# Patient Record
Sex: Male | Born: 1945 | Race: White | Hispanic: No | Marital: Married | Smoking: Never smoker
Health system: Southern US, Community
[De-identification: ages and names within clinical notes are randomized; demographics above are authoritative.]

## PROBLEM LIST (undated history)

## (undated) DIAGNOSIS — F1020 Alcohol dependence, uncomplicated: Secondary | ICD-10-CM

## (undated) DIAGNOSIS — N400 Enlarged prostate without lower urinary tract symptoms: Secondary | ICD-10-CM

## (undated) DIAGNOSIS — K649 Unspecified hemorrhoids: Secondary | ICD-10-CM

## (undated) DIAGNOSIS — E785 Hyperlipidemia, unspecified: Secondary | ICD-10-CM

## (undated) DIAGNOSIS — R933 Abnormal findings on diagnostic imaging of other parts of digestive tract: Secondary | ICD-10-CM

## (undated) DIAGNOSIS — K8020 Calculus of gallbladder without cholecystitis without obstruction: Secondary | ICD-10-CM

## (undated) DIAGNOSIS — F32A Depression, unspecified: Secondary | ICD-10-CM

## (undated) DIAGNOSIS — K219 Gastro-esophageal reflux disease without esophagitis: Secondary | ICD-10-CM

## (undated) DIAGNOSIS — I10 Essential (primary) hypertension: Secondary | ICD-10-CM

## (undated) DIAGNOSIS — Z87448 Personal history of other diseases of urinary system: Secondary | ICD-10-CM

## (undated) DIAGNOSIS — K5790 Diverticulosis of intestine, part unspecified, without perforation or abscess without bleeding: Secondary | ICD-10-CM

## (undated) DIAGNOSIS — G4730 Sleep apnea, unspecified: Secondary | ICD-10-CM

## (undated) DIAGNOSIS — E611 Iron deficiency: Secondary | ICD-10-CM

## (undated) HISTORY — DX: Abnormal findings on diagnostic imaging of other parts of digestive tract: R93.3

## (undated) HISTORY — DX: Personal history of other diseases of urinary system: Z87.448

## (undated) HISTORY — DX: Gastro-esophageal reflux disease without esophagitis: K21.9

## (undated) HISTORY — DX: Calculus of gallbladder without cholecystitis without obstruction: K80.20

## (undated) HISTORY — DX: Sleep apnea, unspecified: G47.30

## (undated) HISTORY — DX: Alcohol dependence, uncomplicated: F10.20

## (undated) HISTORY — PX: RHINOPLASTY: SUR1284

## (undated) HISTORY — DX: Diverticulosis of intestine, part unspecified, without perforation or abscess without bleeding: K57.90

## (undated) HISTORY — DX: Iron deficiency: E61.1

## (undated) HISTORY — DX: Unspecified hemorrhoids: K64.9

## (undated) HISTORY — DX: Benign prostatic hyperplasia without lower urinary tract symptoms: N40.0

## (undated) HISTORY — PX: OTHER SURGICAL HISTORY: SHX169

## (undated) HISTORY — PX: COLONOSCOPY: SHX174

## (undated) HISTORY — PX: TONSILLECTOMY: SUR1361

## (undated) HISTORY — DX: Essential (primary) hypertension: I10

## (undated) HISTORY — DX: Hyperlipidemia, unspecified: E78.5

## (undated) HISTORY — PX: UPPER GASTROINTESTINAL ENDOSCOPY: SHX188

## (undated) HISTORY — PX: VASECTOMY: SHX75

---

## 2008-04-03 LAB — HM COLONOSCOPY: HM Colonoscopy: NORMAL

## 2009-02-12 HISTORY — PX: CARDIOVASCULAR STRESS TEST: SHX262

## 2012-02-08 ENCOUNTER — Encounter: Payer: Self-pay | Admitting: Internal Medicine

## 2012-02-08 ENCOUNTER — Ambulatory Visit (INDEPENDENT_AMBULATORY_CARE_PROVIDER_SITE_OTHER): Payer: PRIVATE HEALTH INSURANCE | Admitting: Internal Medicine

## 2012-02-08 DIAGNOSIS — I10 Essential (primary) hypertension: Secondary | ICD-10-CM

## 2012-02-08 DIAGNOSIS — E785 Hyperlipidemia, unspecified: Secondary | ICD-10-CM | POA: Insufficient documentation

## 2012-02-08 DIAGNOSIS — E119 Type 2 diabetes mellitus without complications: Secondary | ICD-10-CM | POA: Insufficient documentation

## 2012-02-08 DIAGNOSIS — E669 Obesity, unspecified: Secondary | ICD-10-CM | POA: Insufficient documentation

## 2012-02-08 NOTE — Assessment & Plan Note (Signed)
Controlled on Micardis. Patient would like to change to generic alternative. He will e-mail as to the dose of his Micardis. We'll then plan to change to losartan. We'll check renal function with labs at his work today. Followup in 6 months.

## 2012-02-08 NOTE — Progress Notes (Signed)
Subjective:    Patient ID: Bruce Wyatt, male    DOB: [birth year], 66 y.o.   MRN: [id number]  HPI 66 year old male with a history of diabetes, hypertension, hyperlipidemia presents to establish care. He reports that he is generally doing well. He denies any complaints today. In regards to his diabetes, he notes that it has been described as borderline diabetes. His last hemoglobin A1c was 5.3%. He notes that in the past it has been as high as 7%. He currently takes metformin twice daily. He denies any side effects from this medication. He does not regularly check his blood sugar.  In regards to his hypertension, he notes good control with use of Micardis. He would like to consider changing to a less expensive medication. He is not able to tolerate ACE inhibitors because of cough. He denies any chest pain, palpitations, or shortness of breath.  In regards to hyperlipidemia, he notes that he has been taking Crestor. He denies any myalgia with this medication. He has not had his cholesterol checked within the last 6 months.  Outpatient Encounter Prescriptions as of 02/08/2012  Medication Sig Dispense Refill  . Multiple Vitamins-Minerals (MULTIVITAMIN WITH MINERALS) tablet Take 1 tablet by mouth daily.      . NON FORMULARY Micardis - unknown dose Crestor - unknown dose      . esomeprazole (NEXIUM) 40 MG capsule Take 40 mg by mouth daily before breakfast.      . metFORMIN (GLUCOPHAGE) 500 MG tablet Take 500 mg by mouth 2 (two) times daily with a meal.      . venlafaxine (EFFEXOR-XR) 75 MG 24 hr capsule Take 75 mg by mouth daily.      Marland Kitchen zolpidem (AMBIEN) 10 MG tablet Take 10 mg by mouth at bedtime as needed.        Review of Systems  Constitutional: Negative for fever, chills, activity change, appetite change, fatigue and unexpected weight change.  Eyes: Negative for visual disturbance.  Respiratory: Negative for cough and shortness of breath.   Cardiovascular: Negative for chest pain,  palpitations and leg swelling.  Gastrointestinal: Negative for abdominal pain and abdominal distention.  Genitourinary: Negative for dysuria, urgency and difficulty urinating.  Musculoskeletal: Negative for arthralgias and gait problem.  Skin: Negative for color change and rash.  Hematological: Negative for adenopathy.  Psychiatric/Behavioral: Negative for sleep disturbance and dysphoric mood. The patient is not nervous/anxious.    BP 120/78  Pulse 97  Temp(Src) 98 F (36.7 C) (Oral)  Ht 6\' 1"  (1.854 m)  Wt 246 lb (111.585 kg)  BMI 32.46 kg/m2  SpO2 96%     Objective:   Physical Exam  Constitutional: He is oriented to person, place, and time. He appears well-developed and well-nourished. No distress.  HENT:  Head: Normocephalic and atraumatic.  Right Ear: External ear normal.  Left Ear: External ear normal.  Nose: Nose normal.  Mouth/Throat: Oropharynx is clear and moist. No oropharyngeal exudate.  Eyes: Conjunctivae and EOM are normal. Pupils are equal, round, and reactive to light. Right eye exhibits no discharge. Left eye exhibits no discharge. No scleral icterus.  Neck: Normal range of motion. Neck supple. No tracheal deviation present. No thyromegaly present.  Cardiovascular: Normal rate, regular rhythm and normal heart sounds.  Exam reveals no gallop and no friction rub.   No murmur heard. Pulmonary/Chest: Effort normal and breath sounds normal. No respiratory distress. He has no wheezes. He has no rales. He exhibits no tenderness.  Musculoskeletal: Normal range of motion. He exhibits  no edema.  Lymphadenopathy:    He has no cervical adenopathy.  Neurological: He is alert and oriented to person, place, and time. No cranial nerve deficit. Coordination normal.  Skin: Skin is warm and dry. No rash noted. He is not diaphoretic. No erythema. No pallor.  Psychiatric: He has a normal mood and affect. His behavior is normal. Judgment and thought content normal.            Assessment & Plan:

## 2012-02-08 NOTE — Assessment & Plan Note (Signed)
BMI 32. Discussed mechanisms for weight loss including keeping a food diary and regular physical activity. Gave information on websites to help with calorie counting. Initial goal weight loss 10-20 pounds for overall improved health. Followup 6 months.

## 2012-02-08 NOTE — Assessment & Plan Note (Signed)
Will check lipids with labs at his work. He will call or e-mail with his dose of Crestor. Followup in 6 months.

## 2012-02-08 NOTE — Patient Instructions (Signed)
[email or web address]  [email or web address]  [email or web address]

## 2012-02-08 NOTE — Assessment & Plan Note (Signed)
Patient reports good control with last hemoglobin A1c of 5.3%. We'll continue metformin. Will recheck A1c with labs at his work. Followup 6 months.

## 2012-02-08 NOTE — Progress Notes (Signed)
Addended by: Vernie Murders on: 02/08/2012 06:30 PM   Modules accepted: Orders

## 2012-02-23 ENCOUNTER — Telehealth: Payer: Self-pay | Admitting: Internal Medicine

## 2012-02-23 NOTE — Telephone Encounter (Signed)
Labs look good. Blood sugar was 128 and A1c was 6.2% so I would like for him to continue metformin. Vit D was low at 21.9, so would recommend taking Vit D 1000-2000units daily.  Cholesterol was at goal with LDL 68, goal <70.  Kidney and liver function were normal. Blood counts were normal.

## 2012-02-27 NOTE — Telephone Encounter (Signed)
Patient informed. 

## 2012-03-30 ENCOUNTER — Other Ambulatory Visit: Payer: Self-pay | Admitting: Internal Medicine

## 2012-03-30 MED ORDER — ZOLPIDEM TARTRATE 10 MG PO TABS: 10.0000 mg | ORAL_TABLET | Freq: Every evening | ORAL | Status: DC | PRN

## 2012-03-30 MED ORDER — ROSUVASTATIN CALCIUM 10 MG PO TABS: 10.0000 mg | ORAL_TABLET | Freq: Every day | ORAL | Status: DC

## 2012-03-30 MED ORDER — METFORMIN HCL 500 MG PO TABS: 500.0000 mg | ORAL_TABLET | Freq: Two times a day (BID) | ORAL | Status: DC

## 2012-06-28 ENCOUNTER — Telehealth: Payer: Self-pay | Admitting: Internal Medicine

## 2012-06-28 NOTE — Telephone Encounter (Signed)
??  Can we get the notes from [facility]?

## 2012-06-28 NOTE — Telephone Encounter (Signed)
Patient went to [facility] health and he is being referred to Dr. Achilles Dunk Neurologist, he didn't say why he was being referred there or what type of episode he had.  However he did want you to know that this was being scheduled per [facility].

## 2012-06-29 NOTE — Telephone Encounter (Signed)
I have called the patient and he is going to ask Tammy who is the nurse at [facility] to send over the notes.

## 2012-07-03 ENCOUNTER — Ambulatory Visit: Payer: Self-pay | Admitting: Urology

## 2012-07-03 DIAGNOSIS — R319 Hematuria, unspecified: Secondary | ICD-10-CM | POA: Diagnosis not present

## 2012-07-03 HISTORY — PX: CYSTOSCOPY: SUR368

## 2012-07-16 ENCOUNTER — Telehealth: Payer: Self-pay | Admitting: Internal Medicine

## 2012-07-16 ENCOUNTER — Encounter: Payer: Self-pay | Admitting: Internal Medicine

## 2012-07-16 NOTE — Telephone Encounter (Signed)
Forward 11 pages to Dr. Stan Head for review on 07-16-12 ym

## 2012-07-20 ENCOUNTER — Encounter: Payer: Self-pay | Admitting: Internal Medicine

## 2012-07-20 ENCOUNTER — Ambulatory Visit (INDEPENDENT_AMBULATORY_CARE_PROVIDER_SITE_OTHER): Payer: PRIVATE HEALTH INSURANCE | Admitting: Internal Medicine

## 2012-07-20 VITALS — BP 130/72 | HR 68 | Ht 74.0 in | Wt 245.8 lb

## 2012-07-20 DIAGNOSIS — R933 Abnormal findings on diagnostic imaging of other parts of digestive tract: Secondary | ICD-10-CM

## 2012-07-20 NOTE — Progress Notes (Signed)
Subjective:    Patient ID: Bruce Wyatt, male    DOB: 04/15/1946, 66 y.o.   MRN: [id number] Referred by: Shelia Media, MD and Assunta Gambles, MD HPI The patient is a very pleasant 66 year old white man who is employed in Consulting civil engineer at [employer]. He was seeing his urologist recently because of hematuria and a CT scan of the abdomen and pelvis with contrast demonstrated thickening of the gastric wall. The stomach was not distended well. Upper GI endoscopy was recommended to evaluate further. The patient reports a chronic history of heartburn well controlled by Nexium 40 mg daily. He has no other active GI symptoms other than rare intermittent rectal bleeding with bright red blood on the tissue paper that is known to come from hemorrhoids seen at previous colonoscopy and is in unchanged pattern. Previous colonoscopy and upper endoscopy were performed at [facility] sometime in the last few years, probably 2009. Allergies  Allergen Reactions  . Ace Inhibitors     COUGH   Outpatient Prescriptions Prior to Visit  Medication Sig Dispense Refill  . esomeprazole (NEXIUM) 40 MG capsule Take 40 mg by mouth daily before breakfast.      . metFORMIN (GLUCOPHAGE) 500 MG tablet Take 1 tablet (500 mg total) by mouth 2 (two) times daily with a meal.  60 tablet  5  . Multiple Vitamins-Minerals (MULTIVITAMIN WITH MINERALS) tablet Take 1 tablet by mouth daily.      . rosuvastatin (CRESTOR) 10 MG tablet Take 1 tablet (10 mg total) by mouth daily.  30 tablet  5  . telmisartan-hydrochlorothiazide (MICARDIS HCT) 80-12.5 MG per tablet Take 1 tablet by mouth daily.      Marland Kitchen venlafaxine (EFFEXOR-XR) 75 MG 24 hr capsule Take 75 mg by mouth daily.      Marland Kitchen zolpidem (AMBIEN) 10 MG tablet Take 1 tablet (10 mg total) by mouth at bedtime as needed.  30 tablet  5   Past Medical History  Diagnosis Date  . Hypertension   . Hyperlipidemia   . Diabetes mellitus   . Abnormal colonoscopy     polyps noted, benign, [facility], rec 10  year follow up  . GERD (gastroesophageal reflux disease)   . Iron deficiency   . Vitamin d deficiency   . Gallstones   . H/O hematuria   . Sleep apnea   . Diverticulosis   . Hemorrhoids   . Alcoholism     11 yrs ago   . BPH (benign prostatic hyperplasia)    Past Surgical History  Procedure Date  . Soft palate [facility] for sleep [facility]  . Vasectomy   . Tonsillectomy   . Cardiovascular stress test 02/12/2009    Negative/AT [facility] Dr Domenic Polite  . Cystoscopy 07/03/2012  . Rhinoplasty     soft palate reduction for apnea  . Colonoscopy   . Upper gastrointestinal endoscopy    History   Social History  . Marital Status: Married    Spouse Name: N/A    Number of Children: 2  .     Occupational History  . VP of HR    Social History Main Topics  . Smoking status: Never Smoker   . Smokeless tobacco: Never Used  . Alcohol Use: No     quit 2002  . Drug Use: No    Social History Narrative   Lives in [place], [place] with wife. 2 daughtersNo pets. Hobbies: fishing.Textile management, Conservator, museum/gallery Exercise -  NODaily Caffeine Use:  2 -4 coffee No current alcohol history of alcoholismNo tobacco   Family History  Problem Relation Age of Onset  . Breast cancer Mother   . Heart disease Father     Heart Attack  . Colon cancer Maternal Grandfather   . Diabetes Paternal Grandfather   . Heart disease Paternal Grandfather   . Coronary artery disease Father   . Nephrolithiasis Daughter      Review of Systems This is positive for the suspected hematuria though no particular abnormality was found on urologic workup though he does have benign prostatic hypertrophy.    Objective:   Physical Exam General:  NAD Eyes:   anicteric Lungs:  clear Heart:  S1S2 no rubs, murmurs or gallops Abdomen:  soft and nontender, BS+, no HSM or mass Ext:   no edema   Data Reviewed:  07/03/2012 CT scan report, 06/29/2012 urology notes Dr. Achilles Dunk     Assessment & Plan:    1. Abnormal CT scan, stomach    1. Upper GI endoscopy to exclude gastric pathology. I've explained that this is most often contraction of the wall of the stomach but it is reasonable to pursue upper endoscopy to understand this better. The risks and benefits as well as alternatives of endoscopic procedure(s) have been discussed and reviewed. All questions answered. The patient agrees to proceed.   I appreciate the opportunity to care for this patient.   CC: Shelia Media, MD and Smith Robert, MD

## 2012-07-20 NOTE — Patient Instructions (Addendum)
You have been scheduled for an endoscopy with propofol. Please follow written instructions given to you at your visit today. If you use inhalers (even only as needed), please bring them with you on the day of your procedure.  Thank you for choosing me and [facility].  Carl E. Gessner, M.D., FACG  

## 2012-07-21 ENCOUNTER — Encounter: Payer: Self-pay | Admitting: Internal Medicine

## 2012-07-23 ENCOUNTER — Ambulatory Visit (AMBULATORY_SURGERY_CENTER): Payer: PRIVATE HEALTH INSURANCE | Admitting: Internal Medicine

## 2012-07-23 ENCOUNTER — Encounter: Payer: Self-pay | Admitting: Internal Medicine

## 2012-07-23 VITALS — BP 111/69 | HR 61 | Temp 97.1°F | Resp 16 | Ht 74.0 in | Wt 245.0 lb

## 2012-07-23 DIAGNOSIS — K2990 Gastroduodenitis, unspecified, without bleeding: Secondary | ICD-10-CM

## 2012-07-23 DIAGNOSIS — R933 Abnormal findings on diagnostic imaging of other parts of digestive tract: Secondary | ICD-10-CM

## 2012-07-23 DIAGNOSIS — K2970 Gastritis, unspecified, without bleeding: Secondary | ICD-10-CM

## 2012-07-23 MED ORDER — SODIUM CHLORIDE 0.9 % IV SOLN: 500.0000 mL | INTRAVENOUS | Status: DC

## 2012-07-23 NOTE — Op Note (Signed)
[facility] [address] [place], [place]  [address]  ENDOSCOPY PROCEDURE REPORT  PATIENT:  Bruce Wyatt, Bruce Wyatt  MR#:  [id number] BIRTHDATE:  May 15, 1946, 66 yrs. old  GENDER:  male  ENDOSCOPIST:  Iva Boop, MD, [facility] Referred by:  Ronna Polio, M.D. and Assunta Gambles, MD  PROCEDURE DATE:  07/23/2012 PROCEDURE:  EGD, diagnostic 57846 ASA CLASS:  Class III INDICATIONS:  abnormal imaging thickened gastric wall on CT - incidental  MEDICATIONS:   These medications were titrated to patient response per physician's verbal order, MAC sedation, administered by CRNA, propofol (Diprivan) 150 mg IV, glycopyrrolate (Robinul) 0.2 mg IV TOPICAL ANESTHETIC:  none  DESCRIPTION OF PROCEDURE:   After the risks benefits and alternatives of the procedure were thoroughly explained, informed consent was obtained.  The LB [id number]-H180 [id number] endoscope was introduced through the mouth and advanced to the second portion of the duodenum, without limitations.  The instrument was slowly withdrawn as the mucosa was fully examined. <<PROCEDUREIMAGES>>  Mild gastritis was found in the antrum Erythema and loss of vascular pattern.  Otherwise the examination was normal. Retroflexed views revealed no abnormalities.    The scope was then withdrawn from the patient and the procedure completed.  COMPLICATIONS:  None  ENDOSCOPIC IMPRESSION: 1) Mild gastritis in the antrum 2) Otherwise normal examination RECOMMENDATIONS: No further evaluation. See me as needed  Iva Boop, MD, [facility] Graham  CC:  Ronna Polio MD, Assunta Gambles MD and The Patient  n. eSIGNED:   Iva Boop at 07/23/2012 08:54 AM  Jerrilyn Cairo, [phone]

## 2012-07-23 NOTE — Patient Instructions (Addendum)
There is a mild gastritis, inflammation in the stomach. This is common and not usually an issue. It may explain some of the gastric wall thickening but it was probably due to underdistention of the stomach (not opened up) when the scan was done. This is not a problem, just the way things were at the time of the CT.  Thank you for choosing me and [facility].  Iva Boop, MD, FACG   YOU HAD AN ENDOSCOPIC PROCEDURE TODAY AT THE [facility]: Refer to the procedure report that was given to you for any specific questions about what was found during the examination.  If the procedure report does not answer your questions, please call your gastroenterologist to clarify.  If you requested that your care partner not be given the details of your procedure findings, then the procedure report has been included in a sealed envelope for you to review at your convenience later.  YOU SHOULD EXPECT: Some feelings of bloating in the abdomen. Passage of more gas than usual.  Walking can help get rid of the air that was put into your GI tract during the procedure and reduce the bloating. If you had a lower endoscopy (such as a colonoscopy or flexible sigmoidoscopy) you may notice spotting of blood in your stool or on the toilet paper. If you underwent a bowel prep for your procedure, then you may not have a normal bowel movement for a few days.  DIET: Your first meal following the procedure should be a light meal and then it is ok to progress to your normal diet.  A half-sandwich or bowl of soup is an example of a good first meal.  Heavy or fried foods are harder to digest and may make you feel nauseous or bloated.  Likewise meals heavy in dairy and vegetables can cause extra gas to form and this can also increase the bloating.  Drink plenty of fluids but you should avoid alcoholic beverages for 24 hours.  ACTIVITY: Your care partner should take you home directly after the procedure.  You  should plan to take it easy, moving slowly for the rest of the day.  You can resume normal activity the day after the procedure however you should NOT DRIVE or use heavy machinery for 24 hours (because of the sedation medicines used during the test).    SYMPTOMS TO REPORT IMMEDIATELY: A gastroenterologist can be reached at any hour.  During normal business hours, 8:30 AM to 5:00 PM Monday through Friday, call [phone].  After hours and on weekends, please call the GI answering service at [phone] who will take a message and have the physician on call contact you.   Following lower endoscopy (colonoscopy or flexible sigmoidoscopy):  Excessive amounts of blood in the stool  Significant tenderness or worsening of abdominal pains  Swelling of the abdomen that is new, acute  Fever of 100F or higher  Following upper endoscopy (EGD)  Vomiting of blood or coffee ground material  New chest pain or pain under the shoulder blades  Painful or persistently difficult swallowing  New shortness of breath  Fever of 100F or higher  Black, tarry-looking stools  FOLLOW UP: If any biopsies were taken you will be contacted by phone or by letter within the next 1-3 weeks.  Call your gastroenterologist if you have not heard about the biopsies in 3 weeks.  Our staff will call the home number listed on your records the next business day following  your procedure to check on you and address any questions or concerns that you may have at that time regarding the information given to you following your procedure. This is a courtesy call and so if there is no answer at the home number and we have not heard from you through the emergency physician on call, we will assume that you have returned to your regular daily activities without incident.  SIGNATURES/CONFIDENTIALITY: You and/or your care partner have signed paperwork which will be entered into your electronic medical record.  These signatures attest to  the fact that that the information above on your After Visit Summary has been reviewed and is understood.  Full responsibility of the confidentiality of this discharge information lies with you and/or your care-partner.    Gastritis information given .

## 2012-07-23 NOTE — Progress Notes (Signed)
Patient did not experience any of the following events: a burn prior to discharge; a fall within the facility; wrong site/side/patient/procedure/implant event; or a hospital transfer or hospital admission upon discharge from the facility. (G8907) Patient did not have preoperative order for IV antibiotic SSI prophylaxis. (G8918)  

## 2012-07-24 ENCOUNTER — Telehealth: Payer: Self-pay | Admitting: *Deleted

## 2012-07-24 NOTE — Telephone Encounter (Signed)
  Follow up Call-  Call back number 07/23/2012  Post procedure Call Back phone  # [phone]  Permission to leave phone message Yes     Patient questions:  Message left to call if necessary.

## 2012-07-26 ENCOUNTER — Ambulatory Visit: Payer: PRIVATE HEALTH INSURANCE | Admitting: Internal Medicine

## 2012-10-26 ENCOUNTER — Other Ambulatory Visit: Payer: Self-pay | Admitting: *Deleted

## 2012-10-26 NOTE — Telephone Encounter (Signed)
Last OV 01/2012.

## 2012-11-06 MED ORDER — ZOLPIDEM TARTRATE 10 MG PO TABS: 10.0000 mg | ORAL_TABLET | Freq: Every evening | ORAL | Status: DC | PRN

## 2012-11-09 ENCOUNTER — Other Ambulatory Visit: Payer: Self-pay | Admitting: General Practice

## 2012-11-09 NOTE — Telephone Encounter (Signed)
Med dineid just filled on 11/8

## 2012-11-20 ENCOUNTER — Other Ambulatory Visit: Payer: Self-pay | Admitting: General Practice

## 2012-11-20 MED ORDER — TELMISARTAN-HCTZ 80-12.5 MG PO TABS: 1.0000 | ORAL_TABLET | Freq: Every day | ORAL | Status: DC

## 2012-11-20 MED ORDER — ESOMEPRAZOLE MAGNESIUM 40 MG PO CPDR: 40.0000 mg | DELAYED_RELEASE_CAPSULE | Freq: Every day | ORAL | Status: DC

## 2012-11-20 NOTE — Telephone Encounter (Signed)
Med filled.  

## 2012-11-21 ENCOUNTER — Other Ambulatory Visit: Payer: Self-pay

## 2012-11-21 MED ORDER — ROSUVASTATIN CALCIUM 10 MG PO TABS: 10.0000 mg | ORAL_TABLET | Freq: Every day | ORAL | Status: DC

## 2012-11-21 NOTE — Telephone Encounter (Signed)
Refill request for Crestor 10 mg # 30 [phone] R sent to [facility]

## 2012-12-17 ENCOUNTER — Other Ambulatory Visit: Payer: Self-pay | Admitting: Internal Medicine

## 2012-12-17 MED ORDER — METFORMIN HCL 500 MG PO TABS: 500.0000 mg | ORAL_TABLET | Freq: Two times a day (BID) | ORAL | Status: DC

## 2012-12-17 NOTE — Telephone Encounter (Signed)
Metformin HCL 500 mg tab  Take one tablet by mouth twice daily  # 60

## 2012-12-17 NOTE — Telephone Encounter (Signed)
Med filled.  

## 2012-12-25 DIAGNOSIS — R011 Cardiac murmur, unspecified: Secondary | ICD-10-CM | POA: Diagnosis not present

## 2012-12-25 DIAGNOSIS — I10 Essential (primary) hypertension: Secondary | ICD-10-CM | POA: Diagnosis not present

## 2012-12-25 DIAGNOSIS — E785 Hyperlipidemia, unspecified: Secondary | ICD-10-CM | POA: Diagnosis not present

## 2012-12-25 DIAGNOSIS — R079 Chest pain, unspecified: Secondary | ICD-10-CM | POA: Diagnosis not present

## 2012-12-26 DIAGNOSIS — R011 Cardiac murmur, unspecified: Secondary | ICD-10-CM | POA: Diagnosis not present

## 2013-01-04 DIAGNOSIS — R072 Precordial pain: Secondary | ICD-10-CM | POA: Diagnosis not present

## 2013-01-10 DIAGNOSIS — E785 Hyperlipidemia, unspecified: Secondary | ICD-10-CM | POA: Diagnosis not present

## 2013-01-10 DIAGNOSIS — I359 Nonrheumatic aortic valve disorder, unspecified: Secondary | ICD-10-CM | POA: Diagnosis not present

## 2013-01-10 DIAGNOSIS — I10 Essential (primary) hypertension: Secondary | ICD-10-CM | POA: Diagnosis not present

## 2013-01-10 DIAGNOSIS — I2510 Atherosclerotic heart disease of native coronary artery without angina pectoris: Secondary | ICD-10-CM | POA: Diagnosis not present

## 2013-01-23 DIAGNOSIS — H02829 Cysts of unspecified eye, unspecified eyelid: Secondary | ICD-10-CM | POA: Diagnosis not present

## 2013-01-23 DIAGNOSIS — H52229 Regular astigmatism, unspecified eye: Secondary | ICD-10-CM | POA: Diagnosis not present

## 2013-01-23 DIAGNOSIS — H04129 Dry eye syndrome of unspecified lacrimal gland: Secondary | ICD-10-CM | POA: Diagnosis not present

## 2013-01-23 DIAGNOSIS — H5200 Hypermetropia, unspecified eye: Secondary | ICD-10-CM | POA: Diagnosis not present

## 2013-02-14 DIAGNOSIS — H019 Unspecified inflammation of eyelid: Secondary | ICD-10-CM | POA: Diagnosis not present

## 2013-02-14 DIAGNOSIS — H16109 Unspecified superficial keratitis, unspecified eye: Secondary | ICD-10-CM | POA: Diagnosis not present

## 2013-02-14 DIAGNOSIS — H04129 Dry eye syndrome of unspecified lacrimal gland: Secondary | ICD-10-CM | POA: Diagnosis not present

## 2013-04-03 ENCOUNTER — Encounter: Payer: Self-pay | Admitting: Internal Medicine

## 2013-04-03 ENCOUNTER — Ambulatory Visit (INDEPENDENT_AMBULATORY_CARE_PROVIDER_SITE_OTHER): Payer: Medicare Other | Admitting: Internal Medicine

## 2013-04-03 VITALS — BP 148/86 | HR 59 | Temp 98.4°F | Ht 73.25 in | Wt 239.0 lb

## 2013-04-03 DIAGNOSIS — Z0000 Encounter for general adult medical examination without abnormal findings: Secondary | ICD-10-CM

## 2013-04-03 DIAGNOSIS — Z125 Encounter for screening for malignant neoplasm of prostate: Secondary | ICD-10-CM

## 2013-04-03 DIAGNOSIS — I10 Essential (primary) hypertension: Secondary | ICD-10-CM

## 2013-04-03 DIAGNOSIS — E119 Type 2 diabetes mellitus without complications: Secondary | ICD-10-CM

## 2013-04-03 DIAGNOSIS — E785 Hyperlipidemia, unspecified: Secondary | ICD-10-CM

## 2013-04-03 LAB — COMPREHENSIVE METABOLIC PANEL
CO2: 28 mEq/L (ref 19–32)
GFR: 93.07 mL/min (ref >60.00)
Glucose, Bld: 107 mg/dL — ABNORMAL HIGH (ref 70–99)
Sodium: 139 mEq/L (ref 135–145)
Total Bilirubin: 1 mg/dL (ref 0.3–1.2)
Total Protein: 6.7 g/dL (ref 6.0–8.3)

## 2013-04-03 LAB — PSA, MEDICARE: PSA: 1.82 ng/ml (ref 0.10–4.00)

## 2013-04-03 LAB — LIPID PANEL
Cholesterol: 161 mg/dL (ref 0–200)
VLDL: 29.6 mg/dL (ref 0.0–40.0)

## 2013-04-03 NOTE — Assessment & Plan Note (Signed)
Patient is not check blood sugars. He is compliant with Glucophage. Will check A1c with labs today.

## 2013-04-03 NOTE — Progress Notes (Signed)
Subjective:    Patient ID: Bruce Wyatt, male    DOB: December 13, 1946, 67 y.o.   MRN: [id number]  HPI The patient is here for annual Medicare wellness examination and management of other chronic and acute problems.   The risk factors are reflected in the social history.  The roster of all physicians providing medical care to patient - is listed in the Snapshot section of the chart.  Activities of daily living:  The patient is 100% independent in all ADLs: dressing, toileting, feeding as well as independent mobility  Home safety : The patient has smoke detectors in the home. They wear seatbelts.  There are no firearms at home. There is no violence in the home.   There is no risks for hepatitis, STDs or HIV. There is no history of blood transfusion. They have travelled history to infectious disease endemic areas of the world.  The patient has seen their dentist in the last six month. (Dr. Alveda Reasons office) They have seen their eye doctor in the last year. [facility] - Dr. Nadara Eaton) Some issues with hearing voices in a crowd. They have deferred audiologic testing in the last year.   They do not  have excessive sun exposure. Discussed the need for sun protection: hats, long sleeves and use of sunscreen if there is significant sun exposure. (Dr. Adolphus Birchwood)  Diet: the importance of a healthy diet is discussed. They do have a relatively healthy diet.  The benefits of regular aerobic exercise were discussed. Limited exercise, fishes, yardwork.  Depression screen: there are no signs or vegative symptoms of depression- irritability, change in appetite, anhedonia, sadness/tearfullness.  Cognitive assessment: the patient manages all their financial and personal affairs and is actively engaged. They could relate day,date,year and events; recalled 2/3 objects at 3 minutes; performed clock-face test normally.  The following portions of the patient's history were reviewed and updated as  appropriate: allergies, current medications, past family history, past medical history,  past surgical history, past social history  and problem list.  Visual acuity was not assessed per patient preference since he has regular follow up with her ophthalmologist. Hearing and body mass index were assessed and reviewed.   During the course of the visit the patient was educated and counseled about appropriate screening and preventive services including : fall prevention , diabetes screening, nutrition counseling, colorectal cancer screening, and recommended immunizations.    Patient reports he is generally been doing well since his last visit. He has not been monitoring his blood sugars. He has been compliant with medications. He notes that he has a friend who is a cardiologist to recently performed an experimental test on him with CT looking at the coronary arteries. This has reportedly showed minimal plaque within the LAD and left circumflex and moderate plaque in the right circumflex. Because of these findings, his friend increased his dose of Crestor to 20 mg daily. He denies any recent symptoms of chest pain, shortness of breath, palpitations.  He also notes he was recently evaluated by urology for hematuria. Evaluation was unremarkable. No further recurrence of symptoms.  Outpatient Encounter Prescriptions as of 04/03/2013  Medication Sig Dispense Refill  . esomeprazole (NEXIUM) 40 MG capsule Take 1 capsule (40 mg total) by mouth daily before breakfast.  30 capsule  3  . losartan-hydrochlorothiazide (HYZAAR) 50-12.5 MG per tablet       . metFORMIN (GLUCOPHAGE) 500 MG tablet Take 1 tablet (500 mg total) by mouth 2 (two) times daily with a meal.  60 tablet  2  . rosuvastatin (CRESTOR) 10 MG tablet Take 20 mg by mouth daily.      Marland Kitchen venlafaxine (EFFEXOR-XR) 75 MG 24 hr capsule Take 75 mg by mouth daily.      . [DISCONTINUED] rosuvastatin (CRESTOR) 10 MG tablet Take 1 tablet (10 mg total) by mouth  daily.  30 tablet  5  . Multiple Vitamins-Minerals (MULTIVITAMIN WITH MINERALS) tablet Take 1 tablet by mouth daily.      Marland Kitchen telmisartan-hydrochlorothiazide (MICARDIS HCT) 80-12.5 MG per tablet Take 1 tablet by mouth daily.  30 tablet  3  . [DISCONTINUED] zolpidem (AMBIEN) 10 MG tablet Take 1 tablet (10 mg total) by mouth at bedtime as needed.  30 tablet  3   No facility-administered encounter medications on file as of 04/03/2013.   BP 148/86  Pulse 59  Temp(Src) 98.4 F (36.9 C) (Oral)  Ht 6' 1.25" (1.861 m)  Wt 239 lb (108.41 kg)  BMI 31.3 kg/m2  SpO2 97%   Review of Systems  Constitutional: Negative for fever, chills, activity change, appetite change, fatigue and unexpected weight change.  Eyes: Negative for visual disturbance.  Respiratory: Negative for cough and shortness of breath.   Cardiovascular: Negative for chest pain, palpitations and leg swelling.  Gastrointestinal: Negative for abdominal pain and abdominal distention.  Genitourinary: Negative for dysuria, urgency and difficulty urinating.  Musculoskeletal: Negative for arthralgias and gait problem.  Skin: Negative for color change and rash.  Hematological: Negative for adenopathy.  Psychiatric/Behavioral: Negative for sleep disturbance and dysphoric mood. The patient is not nervous/anxious.        Objective:   Physical Exam  Constitutional: He is oriented to person, place, and time. He appears well-developed and well-nourished. No distress.  HENT:  Head: Normocephalic and atraumatic.  Right Ear: External ear normal.  Left Ear: External ear normal.  Nose: Nose normal.  Mouth/Throat: Oropharynx is clear and moist. No oropharyngeal exudate.  Eyes: Conjunctivae and EOM are normal. Pupils are equal, round, and reactive to light. Right eye exhibits no discharge. Left eye exhibits no discharge. No scleral icterus.  Neck: Normal range of motion. Neck supple. No tracheal deviation present. No thyromegaly present.   Cardiovascular: Normal rate, regular rhythm and normal heart sounds.  Exam reveals no gallop and no friction rub.   No murmur heard. Pulmonary/Chest: Effort normal and breath sounds normal. No respiratory distress. He has no wheezes. He has no rales. He exhibits no tenderness.  Abdominal: Soft. Bowel sounds are normal. He exhibits no distension and no mass. There is no tenderness. There is no rebound and no guarding.  Musculoskeletal: Normal range of motion. He exhibits no edema.  Lymphadenopathy:    He has no cervical adenopathy.  Neurological: He is alert and oriented to person, place, and time. No cranial nerve deficit. Coordination normal.  Skin: Skin is warm and dry. No rash noted. He is not diaphoretic. No erythema. No pallor.  Psychiatric: He has a normal mood and affect. His behavior is normal. Judgment and thought content normal.          Assessment & Plan:

## 2013-04-03 NOTE — Assessment & Plan Note (Signed)
Will check LFTs and lipids with labs today given recent increase in Crestor.

## 2013-04-03 NOTE — Assessment & Plan Note (Signed)
General medical exam normal today. Will check basic labs including CBC, CMP, lipid profile, PSA. Discussed the risks and limitations of PSA testing. Encouraged patient to follow a healthy diet low in saturated fat and processed carbohydrates. Encouraged increased physical activity with a goal of 40 minutes 3 times per week.

## 2013-04-03 NOTE — Assessment & Plan Note (Signed)
  BP Readings from Last 3 Encounters:  04/03/13 148/86  07/23/12 111/69  07/20/12 130/72   Blood pressure slightly elevated today but generally has been well-controlled. Will continue losartan hydrochlorothiazide. We'll check renal function with labs today.

## 2013-04-04 ENCOUNTER — Encounter: Payer: Self-pay | Admitting: *Deleted

## 2013-04-12 ENCOUNTER — Encounter: Payer: Self-pay | Admitting: Emergency Medicine

## 2013-05-16 DIAGNOSIS — D1800 Hemangioma unspecified site: Secondary | ICD-10-CM | POA: Diagnosis not present

## 2013-05-16 DIAGNOSIS — L821 Other seborrheic keratosis: Secondary | ICD-10-CM | POA: Diagnosis not present

## 2013-05-16 DIAGNOSIS — L819 Disorder of pigmentation, unspecified: Secondary | ICD-10-CM | POA: Diagnosis not present

## 2013-06-25 DIAGNOSIS — E119 Type 2 diabetes mellitus without complications: Secondary | ICD-10-CM | POA: Diagnosis not present

## 2013-06-25 LAB — HM DIABETES EYE EXAM: HM Diabetic Eye Exam: NORMAL

## 2013-06-27 ENCOUNTER — Encounter: Payer: Self-pay | Admitting: Internal Medicine

## 2013-06-28 ENCOUNTER — Other Ambulatory Visit: Payer: Self-pay | Admitting: Internal Medicine

## 2013-06-28 DIAGNOSIS — E119 Type 2 diabetes mellitus without complications: Secondary | ICD-10-CM

## 2013-06-28 DIAGNOSIS — I10 Essential (primary) hypertension: Secondary | ICD-10-CM

## 2013-06-28 DIAGNOSIS — E785 Hyperlipidemia, unspecified: Secondary | ICD-10-CM

## 2013-06-28 DIAGNOSIS — Z125 Encounter for screening for malignant neoplasm of prostate: Secondary | ICD-10-CM

## 2013-07-19 ENCOUNTER — Other Ambulatory Visit: Payer: Self-pay | Admitting: Internal Medicine

## 2013-07-19 ENCOUNTER — Encounter: Payer: Self-pay | Admitting: Internal Medicine

## 2013-07-19 ENCOUNTER — Telehealth: Payer: Self-pay | Admitting: Internal Medicine

## 2013-07-19 NOTE — Telephone Encounter (Signed)
Needs eye and foot exam

## 2013-07-24 ENCOUNTER — Other Ambulatory Visit: Payer: Self-pay

## 2013-08-04 LAB — HM DIABETES EYE EXAM

## 2013-09-04 ENCOUNTER — Encounter: Payer: Self-pay | Admitting: Internal Medicine

## 2013-09-04 ENCOUNTER — Ambulatory Visit (INDEPENDENT_AMBULATORY_CARE_PROVIDER_SITE_OTHER): Payer: Medicare Other | Admitting: Internal Medicine

## 2013-09-04 VITALS — BP 118/84 | HR 60 | Temp 98.2°F | Wt 238.0 lb

## 2013-09-04 DIAGNOSIS — I10 Essential (primary) hypertension: Secondary | ICD-10-CM

## 2013-09-04 DIAGNOSIS — E785 Hyperlipidemia, unspecified: Secondary | ICD-10-CM

## 2013-09-04 DIAGNOSIS — Z23 Encounter for immunization: Secondary | ICD-10-CM

## 2013-09-04 DIAGNOSIS — N400 Enlarged prostate without lower urinary tract symptoms: Secondary | ICD-10-CM | POA: Diagnosis not present

## 2013-09-04 DIAGNOSIS — E119 Type 2 diabetes mellitus without complications: Secondary | ICD-10-CM | POA: Diagnosis not present

## 2013-09-04 DIAGNOSIS — E669 Obesity, unspecified: Secondary | ICD-10-CM

## 2013-09-04 LAB — COMPREHENSIVE METABOLIC PANEL
ALT: 19 U/L (ref 0–53)
AST: 18 U/L (ref 0–37)
Alkaline Phosphatase: 63 U/L (ref 39–117)
CO2: 28 mEq/L (ref 19–32)
Creatinine, Ser: 0.8 mg/dL (ref 0.4–1.5)
Sodium: 138 mEq/L (ref 135–145)
Total Bilirubin: 0.8 mg/dL (ref 0.3–1.2)
Total Protein: 7.4 g/dL (ref 6.0–8.3)

## 2013-09-04 LAB — MICROALBUMIN / CREATININE URINE RATIO
Creatinine,U: 193.8 mg/dL
Microalb Creat Ratio: 0.5 mg/g (ref 0.0–30.0)

## 2013-09-04 LAB — LIPID PANEL
Cholesterol: 138 mg/dL (ref 0–200)
HDL: 46.4 mg/dL (ref >39.00)
VLDL: 30 mg/dL (ref 0.0–40.0)

## 2013-09-04 NOTE — Assessment & Plan Note (Signed)
Wt Readings from Last 3 Encounters:  09/04/13 238 lb (107.956 kg)  04/03/13 239 lb (108.41 kg)  07/23/12 245 lb (111.131 kg)   Body mass index is 31.17 kg/(m^2). Congratulated pt on recent weight loss. Encouraged continued efforts at healthy diet and regular physical activity.

## 2013-09-04 NOTE — Progress Notes (Signed)
  Subjective:    Patient ID: Bruce Wyatt, male    DOB: [birth year], 67 y.o.   MRN: [id number]  HPI 67YO male with h/o DM, HTN, HL presents for follow up. Reports he is doing well. No concerns today. Does not check BG. Compliant with meds. Has been following generally healthy diet and exercising regularly. Has lost another pound.  Outpatient Prescriptions Prior to Visit  Medication Sig Dispense Refill  . esomeprazole (NEXIUM) 40 MG capsule Take 1 capsule (40 mg total) by mouth daily before breakfast.  30 capsule  3  . losartan-hydrochlorothiazide (HYZAAR) 50-12.5 MG per tablet       . metFORMIN (GLUCOPHAGE) 500 MG tablet Take 1 tablet (500 mg total) by mouth 2 (two) times daily with a meal.  60 tablet  2  . Multiple Vitamins-Minerals (MULTIVITAMIN WITH MINERALS) tablet Take 1 tablet by mouth daily.      . rosuvastatin (CRESTOR) 10 MG tablet Take 20 mg by mouth daily.      Marland Kitchen venlafaxine (EFFEXOR-XR) 75 MG 24 hr capsule Take 75 mg by mouth daily.       No facility-administered medications prior to visit.   BP 118/84  Pulse 60  Temp(Src) 98.2 F (36.8 C) (Oral)  Wt 238 lb (107.956 kg)  BMI 31.17 kg/m2  SpO2 96%  Review of Systems  Constitutional: Negative for fever, chills, activity change, appetite change, fatigue and unexpected weight change.  Eyes: Negative for visual disturbance.  Respiratory: Negative for cough and shortness of breath.   Cardiovascular: Negative for chest pain, palpitations and leg swelling.  Gastrointestinal: Negative for abdominal pain and abdominal distention.  Genitourinary: Negative for dysuria, urgency and difficulty urinating.  Musculoskeletal: Negative for arthralgias and gait problem.  Skin: Negative for color change and rash.  Hematological: Negative for adenopathy.  Psychiatric/Behavioral: Negative for sleep disturbance and dysphoric mood. The patient is not nervous/anxious.        Objective:   Physical Exam  Constitutional: He is oriented to  person, place, and time. He appears well-developed and well-nourished. No distress.  HENT:  Head: Normocephalic and atraumatic.  Right Ear: External ear normal.  Left Ear: External ear normal.  Nose: Nose normal.  Mouth/Throat: Oropharynx is clear and moist. No oropharyngeal exudate.  Eyes: Conjunctivae and EOM are normal. Pupils are equal, round, and reactive to light. Right eye exhibits no discharge. Left eye exhibits no discharge. No scleral icterus.  Neck: Normal range of motion. Neck supple. No tracheal deviation present. No thyromegaly present.  Cardiovascular: Normal rate, regular rhythm and normal heart sounds.  Exam reveals no gallop and no friction rub.   No murmur heard. Pulmonary/Chest: Effort normal and breath sounds normal. No respiratory distress. He has no wheezes. He has no rales. He exhibits no tenderness.  Abdominal: Soft. Bowel sounds are normal. He exhibits no distension. There is no tenderness.  Musculoskeletal: Normal range of motion. He exhibits no edema.  Lymphadenopathy:    He has no cervical adenopathy.  Neurological: He is alert and oriented to person, place, and time. No cranial nerve deficit. Coordination normal.  Skin: Skin is warm and dry. No rash noted. He is not diaphoretic. No erythema. No pallor.  Psychiatric: He has a normal mood and affect. His behavior is normal. Judgment and thought content normal.          Assessment & Plan:

## 2013-09-04 NOTE — Assessment & Plan Note (Signed)
Will check lipids and LFTs with labs today. Continue Crestor. 

## 2013-09-04 NOTE — Assessment & Plan Note (Addendum)
BP Readings from Last 3 Encounters:  09/04/13 118/84  04/03/13 148/86  07/23/12 111/69   BP well controlled on current medications. Will continue. Will check renal function with labs.

## 2013-09-04 NOTE — Assessment & Plan Note (Signed)
Pt does not check BG. Will check A1c with labs today. Continue metformin. Follow up 6 months and prn.

## 2013-09-04 NOTE — Assessment & Plan Note (Signed)
History of enlarged prostate in the past. Prefers to follow with urology in the [facility] system. Will set up evaluation. Last PSA in 03/2013 normal.

## 2013-11-15 IMAGING — CT CT ABD-PELV W/ CM
1 of 3 series · 12 of 32 positions shown, 18 images · non-contrast
Comparison: none

REASON FOR EXAM: Hematuria  With Delays
COMMENTS:

PROCEDURE:     CT  - CT ABDOMEN / PELVIS  W  - July 03, 2012  [DATE]
RESULT:
HISTORY: Hematuria.
Comparison Study: No prior.

[Series 2: 3mm soft tissue · axial · 0.90mm/px · z∈[-472,-44]mm · 12 of 170 slices shown, 18 images]
[im 14/170  soft-tissue]
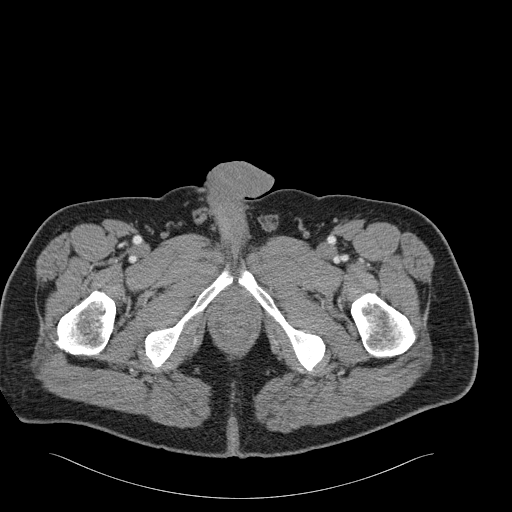
[im 14/170  bone]
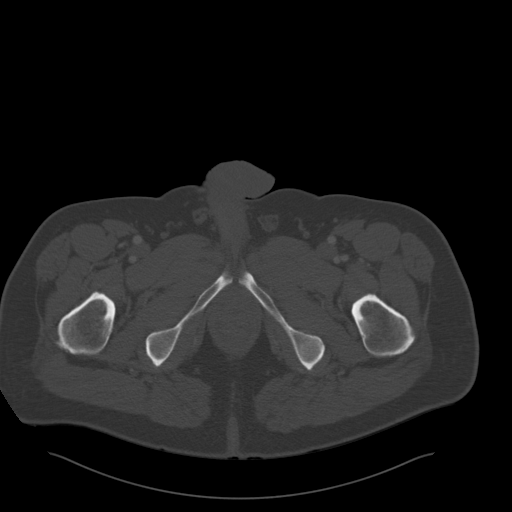
[im 27/170  soft-tissue]
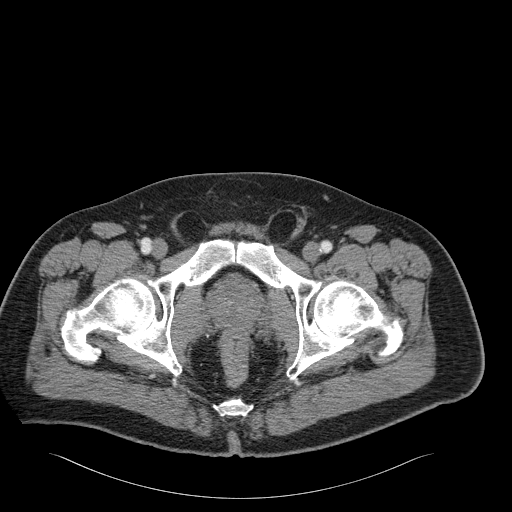
[im 40/170  soft-tissue]
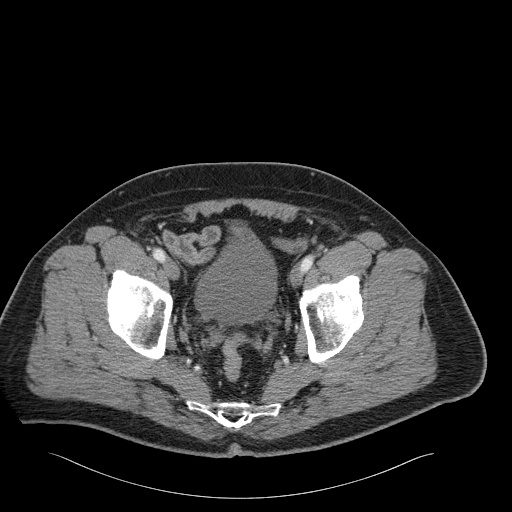
[im 53/170  soft-tissue]
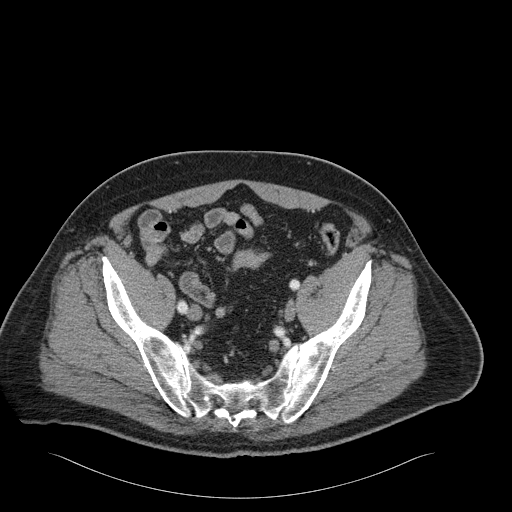
[im 66/170  soft-tissue]
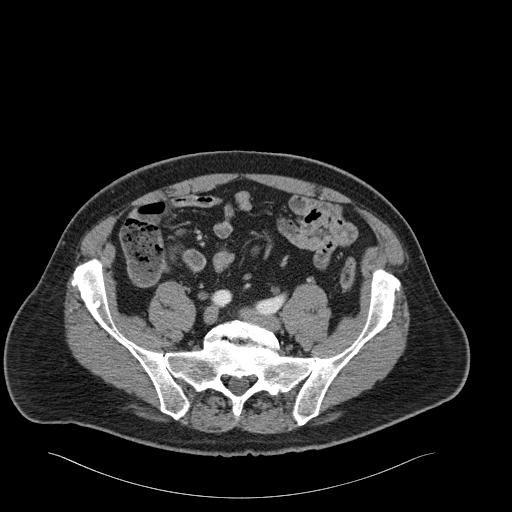
[im 79/170  soft-tissue]
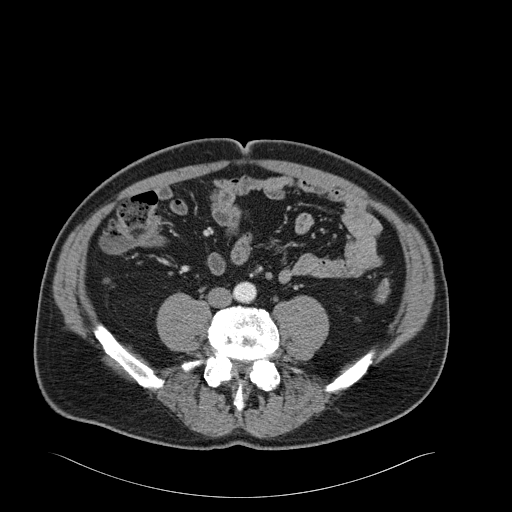
[im 92/170  soft-tissue]
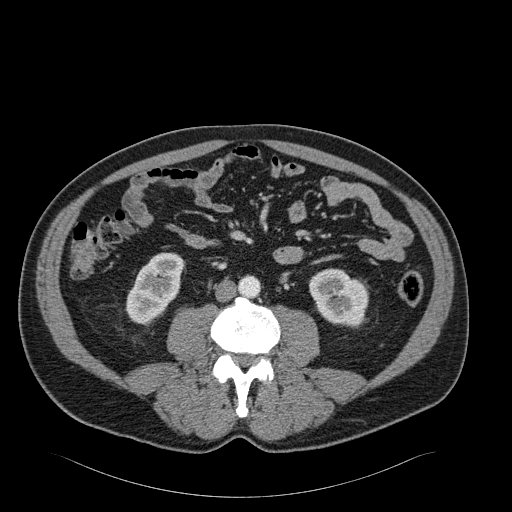
[im 105/170  soft-tissue]
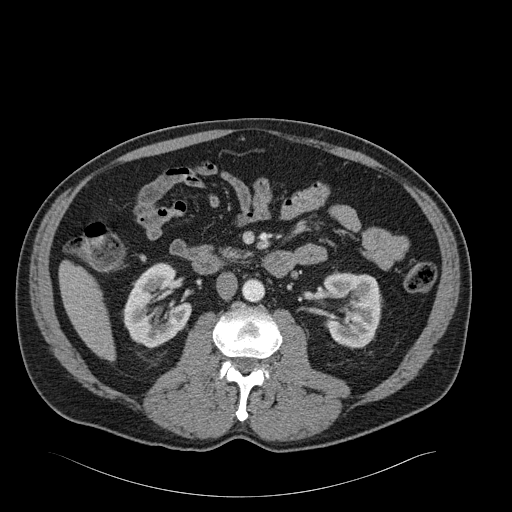
[im 118/170  soft-tissue]
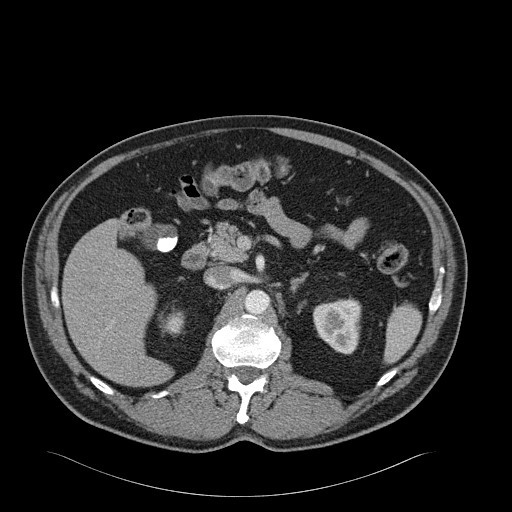
[im 118/170  lung]
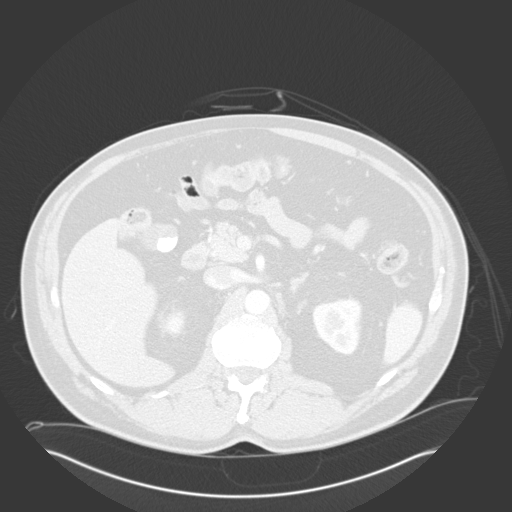
[im 118/170  bone]
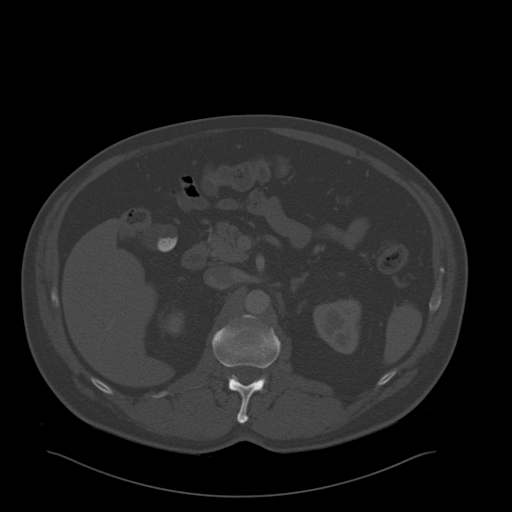
[im 131/170  soft-tissue]
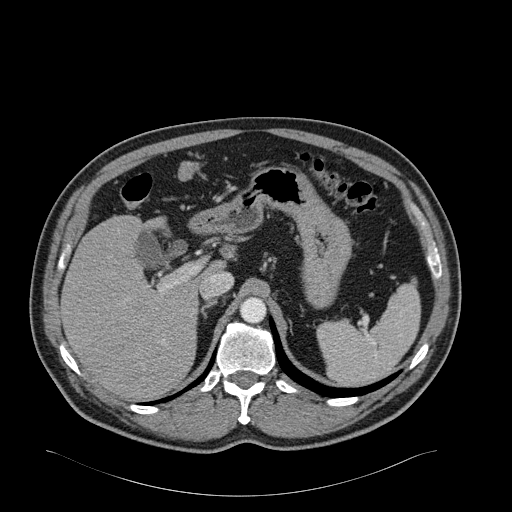
[im 131/170  lung]
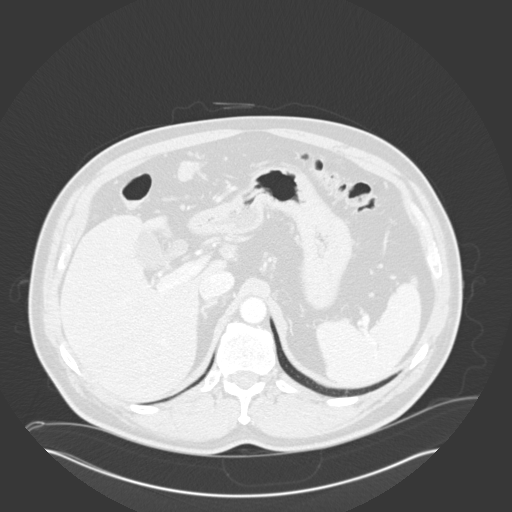
[im 144/170  soft-tissue]
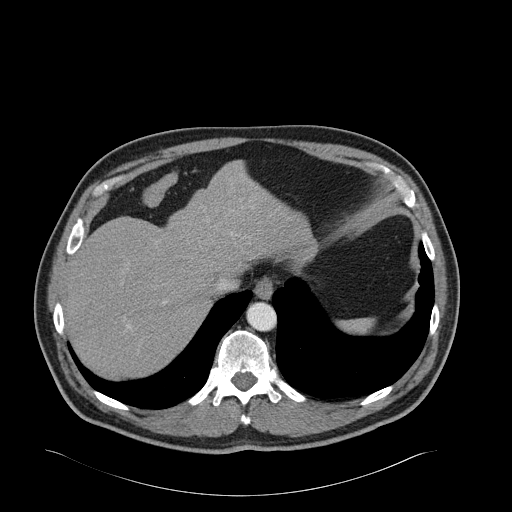
[im 144/170  lung]
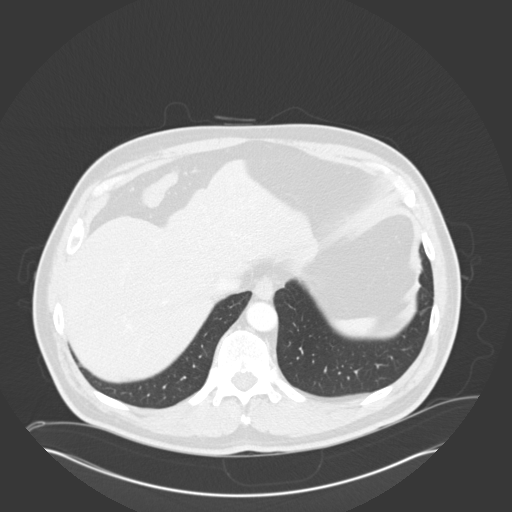
[im 157/170  soft-tissue]
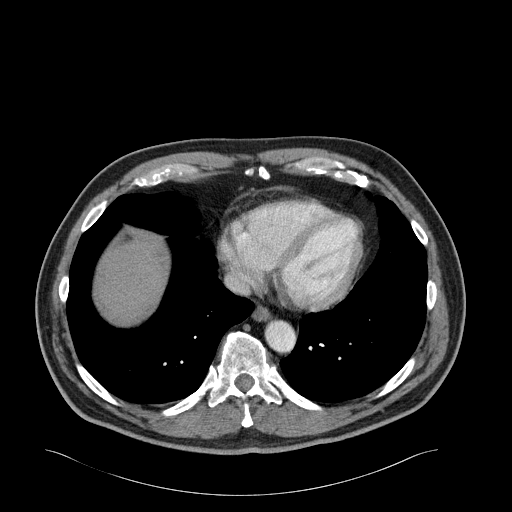
[im 157/170  lung]
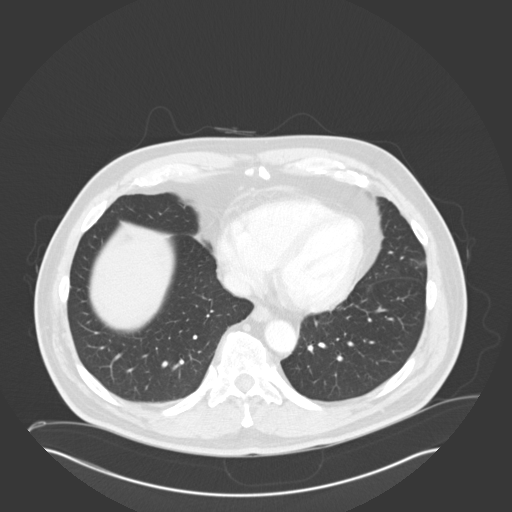

[12 of 32 positions shown; findings below may reference images not displayed]

FINDINGS: Standard CT was obtained with 100 mL of 1sovue-WDD. The liver is
normal. Gallstones are noted. Spleen is normal. There is splenosis. Mild
thickening of the gastric wall is noted. The stomach is nondistended. Upper
GI can be obtained or endoscopy should be considered for further evaluation
to exclude gastric pathology Pancreas is normal. Adrenals are normal. No
focal renal abnormality. The aorta is nondistended. The appendix is normal.
No inflammatory changes in the left lower quadrant. Bladder is nondistended.
No hydronephrosis. There is coronary artery disease. There is mild
pericardial thickening.
IMPRESSION: 1. Gallstones.
2. Cannot exclude gastric wall thickening for which upper GI or upper
endoscopy is suggested for further evaluation.

## 2013-12-05 DIAGNOSIS — R339 Retention of urine, unspecified: Secondary | ICD-10-CM | POA: Diagnosis not present

## 2013-12-05 DIAGNOSIS — N401 Enlarged prostate with lower urinary tract symptoms: Secondary | ICD-10-CM | POA: Diagnosis not present

## 2013-12-13 DIAGNOSIS — M25519 Pain in unspecified shoulder: Secondary | ICD-10-CM | POA: Diagnosis not present

## 2013-12-17 DIAGNOSIS — M25519 Pain in unspecified shoulder: Secondary | ICD-10-CM | POA: Diagnosis not present

## 2013-12-19 DIAGNOSIS — M25519 Pain in unspecified shoulder: Secondary | ICD-10-CM | POA: Diagnosis not present

## 2014-01-06 ENCOUNTER — Other Ambulatory Visit: Payer: Self-pay | Admitting: Internal Medicine

## 2014-01-06 MED ORDER — METFORMIN HCL 500 MG PO TABS: 500.0000 mg | ORAL_TABLET | Freq: Two times a day (BID) | ORAL | Status: DC

## 2014-01-06 MED ORDER — ROSUVASTATIN CALCIUM 10 MG PO TABS: 20.0000 mg | ORAL_TABLET | Freq: Every day | ORAL | Status: DC

## 2014-01-06 MED ORDER — VENLAFAXINE HCL ER 75 MG PO CP24: 75.0000 mg | ORAL_CAPSULE | Freq: Every day | ORAL | Status: DC

## 2014-01-06 MED ORDER — LOSARTAN POTASSIUM-HCTZ 50-12.5 MG PO TABS: 1.0000 | ORAL_TABLET | Freq: Every day | ORAL | Status: DC

## 2014-01-13 DIAGNOSIS — K219 Gastro-esophageal reflux disease without esophagitis: Secondary | ICD-10-CM | POA: Diagnosis not present

## 2014-01-13 DIAGNOSIS — E785 Hyperlipidemia, unspecified: Secondary | ICD-10-CM | POA: Diagnosis not present

## 2014-01-13 DIAGNOSIS — I10 Essential (primary) hypertension: Secondary | ICD-10-CM | POA: Diagnosis not present

## 2014-01-19 DIAGNOSIS — M25519 Pain in unspecified shoulder: Secondary | ICD-10-CM | POA: Diagnosis not present

## 2014-01-20 ENCOUNTER — Other Ambulatory Visit: Payer: Self-pay | Admitting: Internal Medicine

## 2014-01-22 ENCOUNTER — Telehealth: Payer: Self-pay | Admitting: Internal Medicine

## 2014-01-22 NOTE — Telephone Encounter (Signed)
Received refill request for Zolpidem 10 mg.

## 2014-01-22 NOTE — Telephone Encounter (Signed)
Fine to fill Ambien 10mg  #30 with 3 refills

## 2014-01-24 MED ORDER — ZOLPIDEM TARTRATE 10 MG PO TABS: 10.0000 mg | ORAL_TABLET | Freq: Every evening | ORAL | Status: DC | PRN

## 2014-01-24 NOTE — Telephone Encounter (Signed)
Prescription printed and faxed to pharmacy on file

## 2014-02-10 ENCOUNTER — Encounter: Payer: Self-pay | Admitting: *Deleted

## 2014-02-16 DIAGNOSIS — M25519 Pain in unspecified shoulder: Secondary | ICD-10-CM | POA: Diagnosis not present

## 2014-03-03 LAB — HM DIABETES EYE EXAM

## 2014-03-04 ENCOUNTER — Encounter: Payer: Self-pay | Admitting: Internal Medicine

## 2014-03-04 ENCOUNTER — Ambulatory Visit (INDEPENDENT_AMBULATORY_CARE_PROVIDER_SITE_OTHER): Payer: Medicare Other | Admitting: Internal Medicine

## 2014-03-04 VITALS — BP 130/88 | HR 74 | Temp 98.3°F | Resp 16 | Ht 73.25 in | Wt 240.0 lb

## 2014-03-04 DIAGNOSIS — E119 Type 2 diabetes mellitus without complications: Secondary | ICD-10-CM | POA: Diagnosis not present

## 2014-03-04 DIAGNOSIS — I10 Essential (primary) hypertension: Secondary | ICD-10-CM

## 2014-03-04 DIAGNOSIS — Z125 Encounter for screening for malignant neoplasm of prostate: Secondary | ICD-10-CM

## 2014-03-04 DIAGNOSIS — Z0000 Encounter for general adult medical examination without abnormal findings: Secondary | ICD-10-CM | POA: Insufficient documentation

## 2014-03-04 LAB — COMPREHENSIVE METABOLIC PANEL
ALK PHOS: 70 U/L (ref 39–117)
ALT: 21 U/L (ref 0–53)
AST: 15 U/L (ref 0–37)
Albumin: 4.6 g/dL (ref 3.5–5.2)
BILIRUBIN TOTAL: 0.6 mg/dL (ref 0.3–1.2)
BUN: 18 mg/dL (ref 6–23)
CO2: 24 mEq/L (ref 19–32)
Calcium: 9.7 mg/dL (ref 8.4–10.5)
Chloride: 104 mEq/L (ref 96–112)
Creatinine, Ser: 0.9 mg/dL (ref 0.4–1.5)
GFR: 89.25 mL/min (ref >60.00)
GLUCOSE: 127 mg/dL — AB (ref 70–99)
Potassium: 4.3 mEq/L (ref 3.5–5.1)
SODIUM: 139 meq/L (ref 135–145)
TOTAL PROTEIN: 7.3 g/dL (ref 6.0–8.3)

## 2014-03-04 LAB — LIPID PANEL
Cholesterol: 160 mg/dL (ref 0–200)
HDL: 50.2 mg/dL (ref >39.00)
LDL CALC: 71 mg/dL (ref 0–99)
Total CHOL/HDL Ratio: 3
Triglycerides: 193 mg/dL — ABNORMAL HIGH (ref 0.0–149.0)
VLDL: 38.6 mg/dL (ref 0.0–40.0)

## 2014-03-04 LAB — MICROALBUMIN / CREATININE URINE RATIO
Creatinine,U: 229.6 mg/dL
MICROALB/CREAT RATIO: 1.4 mg/g (ref 0.0–30.0)
Microalb, Ur: 3.1 mg/dL — ABNORMAL HIGH (ref 0.0–1.9)

## 2014-03-04 LAB — PSA, MEDICARE: PSA: 1.72 ng/ml (ref 0.10–4.00)

## 2014-03-04 LAB — HM DIABETES FOOT EXAM: HM Diabetic Foot Exam: NORMAL

## 2014-03-04 LAB — HEMOGLOBIN A1C: HEMOGLOBIN A1C: 6.4 % (ref 4.6–6.5)

## 2014-03-04 NOTE — Assessment & Plan Note (Signed)
BP Readings from Last 3 Encounters:  03/04/14 130/88  09/04/13 118/84  04/03/13 148/86   BP well controlled on losartan-HCTZ. Will continue. Will check renal function with labs.

## 2014-03-04 NOTE — Progress Notes (Signed)
Subjective:    Patient ID: Bruce Wyatt, male    DOB: 1946/04/04, 68 y.o.   MRN: [id number]  HPI  The patient is here for annual Medicare wellness examination and management of other chronic and acute problems.   The risk factors are reflected in the social history.  The roster of all physicians providing medical care to patient - is listed in the Snapshot section of the chart.  Activities of daily living:  The patient is 100% independent in all ADLs: dressing, toileting, feeding as well as independent mobility. No pets. Lives in 1 story home. Hard wood and carpeted floors.  Home safety : The patient has smoke detectors in the home. They wear seatbelts.  There are no firearms at home. There is no violence in the home.   There is no risks for hepatitis, STDs or HIV. There is no history of blood transfusion. They have travelled history to infectious disease endemic areas of the world.  The patient has seen their dentist in the last six month. (Dr. Corky Sing office) They have seen their eye doctor in the last year. [facility] - Dr. Zachery Dakins) Some issues with hearing voices in a crowd. They have deferred audiologic testing in the last year.   They do not  have excessive sun exposure. Discussed the need for sun protection: hats, long sleeves and use of sunscreen if there is significant sun exposure. (Dr. Evorn Gong)  Diet: the importance of a healthy diet is discussed. They do have a relatively healthy diet.  The benefits of regular aerobic exercise were discussed. Limited exercise, fishes, yardwork.  Depression screen: there are no signs or vegative symptoms of depression- irritability, change in appetite, anhedonia, sadness/tearfullness.  Cognitive assessment: the patient manages all their financial and personal affairs and is actively engaged.   HCPOA - wife, [name]  The following portions of the patient's history were reviewed and updated as  appropriate: allergies, current medications, past family history, past medical history,  past surgical history, past social history  and problem list.  Visual acuity was not assessed per patient preference since he has regular follow up with her ophthalmologist. Hearing and body mass index were assessed and reviewed.   During the course of the visit the patient was educated and counseled about appropriate screening and preventive services including : fall prevention , diabetes screening, nutrition counseling, colorectal cancer screening, and recommended immunizations.      Outpatient Encounter Prescriptions as of 03/04/2014  Medication Sig  . losartan-hydrochlorothiazide (HYZAAR) 50-12.5 MG per tablet Take 1 tablet by mouth daily.  . metFORMIN (GLUCOPHAGE) 500 MG tablet Take 1 tablet (500 mg total) by mouth 2 (two) times daily with a meal.  . Multiple Vitamins-Minerals (MULTIVITAMIN WITH MINERALS) tablet Take 1 tablet by mouth daily.  Marland Kitchen NEXIUM 40 MG capsule TAKE ONE CAPSULE BY MOUTH EVERY DAY  . rosuvastatin (CRESTOR) 10 MG tablet Take 2 tablets (20 mg total) by mouth daily.  Marland Kitchen venlafaxine XR (EFFEXOR-XR) 75 MG 24 hr capsule Take 1 capsule (75 mg total) by mouth daily.  Marland Kitchen zolpidem (AMBIEN) 10 MG tablet Take 1 tablet (10 mg total) by mouth at bedtime as needed.  Marland Kitchen RAPAFLO 8 MG CAPS capsule    BP 130/88  Pulse 74  Temp(Src) 98.3 F (36.8 C) (Oral)  Resp 16  Ht 6' 1.25" (1.861 m)  Wt 240 lb (108.863 kg)  BMI 31.43 kg/m2  SpO2 95%  Review of Systems  Constitutional: Negative for fever, chills,  activity change, appetite change, fatigue and unexpected weight change.  Eyes: Negative for visual disturbance.  Respiratory: Negative for cough and shortness of breath.   Cardiovascular: Negative for chest pain, palpitations and leg swelling.  Gastrointestinal: Negative for abdominal pain and abdominal distention.  Genitourinary: Negative for dysuria, urgency and difficulty urinating.    Musculoskeletal: Negative for arthralgias and gait problem.  Skin: Negative for color change and rash.  Hematological: Negative for adenopathy.  Psychiatric/Behavioral: Negative for sleep disturbance and dysphoric mood. The patient is not nervous/anxious.        Objective:   Physical Exam  Constitutional: He is oriented to person, place, and time. He appears well-developed and well-nourished. No distress.  HENT:  Head: Normocephalic and atraumatic.  Right Ear: External ear normal.  Left Ear: External ear normal.  Nose: Nose normal.  Mouth/Throat: Oropharynx is clear and moist. No oropharyngeal exudate.  Eyes: Conjunctivae and EOM are normal. Pupils are equal, round, and reactive to light. Right eye exhibits no discharge. Left eye exhibits no discharge. No scleral icterus.  Neck: Normal range of motion. Neck supple. No tracheal deviation present. No thyromegaly present.  Cardiovascular: Normal rate, regular rhythm and normal heart sounds.  Exam reveals no gallop and no friction rub.   No murmur heard. Pulmonary/Chest: Effort normal and breath sounds normal. No accessory muscle usage. Not tachypneic. No respiratory distress. He has no decreased breath sounds. He has no wheezes. He has no rhonchi. He has no rales. He exhibits no tenderness.  Abdominal: Soft. Bowel sounds are normal. He exhibits no distension and no mass. There is no tenderness. There is no rebound and no guarding.  Musculoskeletal: Normal range of motion. He exhibits no edema.  Lymphadenopathy:    He has no cervical adenopathy.  Neurological: He is alert and oriented to person, place, and time. No cranial nerve deficit. Coordination normal.  Skin: Skin is warm and dry. No rash noted. He is not diaphoretic. No erythema. No pallor.  Psychiatric: He has a normal mood and affect. His behavior is normal. Judgment and thought content normal.          Assessment & Plan:

## 2014-03-04 NOTE — Patient Instructions (Signed)
Plan to get Prevnar after 08/2014.

## 2014-03-04 NOTE — Assessment & Plan Note (Signed)
Will check A1c with labs today. Foot exam normal today.Eye exam UTD.

## 2014-03-04 NOTE — Assessment & Plan Note (Signed)
General medical exam normal today. Appropriate screening performed. Patient is up-to-date on colonoscopy. Immunizations are up-to-date. Patient is due for Prevnar in September 2015. Will check labs today including CBC, CMP, lipid profile, PSA. Potential limitations of PSA testing discussed with patient today.

## 2014-03-05 ENCOUNTER — Telehealth: Payer: Self-pay | Admitting: Internal Medicine

## 2014-03-05 NOTE — Telephone Encounter (Signed)
Relevant patient education assigned to patient using Emmi. ° °

## 2014-03-13 ENCOUNTER — Telehealth: Payer: Self-pay

## 2014-03-13 NOTE — Telephone Encounter (Signed)
Relevant patient education assigned to patient using Emmi. ° °

## 2014-03-19 DIAGNOSIS — M25519 Pain in unspecified shoulder: Secondary | ICD-10-CM | POA: Diagnosis not present

## 2014-04-28 ENCOUNTER — Other Ambulatory Visit: Payer: Self-pay | Admitting: Internal Medicine

## 2014-05-26 ENCOUNTER — Other Ambulatory Visit: Payer: Self-pay | Admitting: Internal Medicine

## 2014-05-28 DIAGNOSIS — J209 Acute bronchitis, unspecified: Secondary | ICD-10-CM | POA: Diagnosis not present

## 2014-06-01 DIAGNOSIS — J209 Acute bronchitis, unspecified: Secondary | ICD-10-CM | POA: Diagnosis not present

## 2014-07-01 DIAGNOSIS — H52229 Regular astigmatism, unspecified eye: Secondary | ICD-10-CM | POA: Diagnosis not present

## 2014-07-01 DIAGNOSIS — E119 Type 2 diabetes mellitus without complications: Secondary | ICD-10-CM | POA: Diagnosis not present

## 2014-07-01 DIAGNOSIS — E11359 Type 2 diabetes mellitus with proliferative diabetic retinopathy without macular edema: Secondary | ICD-10-CM | POA: Diagnosis not present

## 2014-07-01 DIAGNOSIS — H5200 Hypermetropia, unspecified eye: Secondary | ICD-10-CM | POA: Diagnosis not present

## 2014-07-01 DIAGNOSIS — H524 Presbyopia: Secondary | ICD-10-CM | POA: Diagnosis not present

## 2014-07-02 ENCOUNTER — Other Ambulatory Visit: Payer: Self-pay | Admitting: Internal Medicine

## 2014-07-02 NOTE — Telephone Encounter (Signed)
Last refill 6.8.15, last OV 3.17.15, future OV 9.29.15.  Please advise refill.

## 2014-09-06 ENCOUNTER — Encounter: Payer: Self-pay | Admitting: Internal Medicine

## 2014-09-16 ENCOUNTER — Encounter: Payer: Self-pay | Admitting: Internal Medicine

## 2014-09-16 ENCOUNTER — Ambulatory Visit (INDEPENDENT_AMBULATORY_CARE_PROVIDER_SITE_OTHER): Payer: Medicare Other | Admitting: Internal Medicine

## 2014-09-16 VITALS — BP 112/72 | HR 66 | Temp 98.2°F | Wt 240.0 lb

## 2014-09-16 DIAGNOSIS — E785 Hyperlipidemia, unspecified: Secondary | ICD-10-CM | POA: Diagnosis not present

## 2014-09-16 DIAGNOSIS — I10 Essential (primary) hypertension: Secondary | ICD-10-CM | POA: Diagnosis not present

## 2014-09-16 DIAGNOSIS — Z0289 Encounter for other administrative examinations: Secondary | ICD-10-CM

## 2014-09-16 DIAGNOSIS — Z23 Encounter for immunization: Secondary | ICD-10-CM | POA: Diagnosis not present

## 2014-09-16 DIAGNOSIS — E119 Type 2 diabetes mellitus without complications: Secondary | ICD-10-CM | POA: Diagnosis not present

## 2014-09-16 LAB — COMPREHENSIVE METABOLIC PANEL
ALT: 14 U/L (ref 0–53)
AST: 15 U/L (ref 0–37)
Albumin: 4.4 g/dL (ref 3.5–5.2)
Alkaline Phosphatase: 65 U/L (ref 39–117)
BUN: 14 mg/dL (ref 6–23)
CALCIUM: 9.3 mg/dL (ref 8.4–10.5)
CHLORIDE: 105 meq/L (ref 96–112)
CO2: 26 meq/L (ref 19–32)
CREATININE: 0.9 mg/dL (ref 0.4–1.5)
GFR: 87.98 mL/min (ref >60.00)
GLUCOSE: 126 mg/dL — AB (ref 70–99)
Potassium: 4.2 mEq/L (ref 3.5–5.1)
Sodium: 138 mEq/L (ref 135–145)
Total Bilirubin: 0.9 mg/dL (ref 0.2–1.2)
Total Protein: 7.4 g/dL (ref 6.0–8.3)

## 2014-09-16 LAB — LIPID PANEL
CHOL/HDL RATIO: 3
CHOLESTEROL: 136 mg/dL (ref 0–200)
HDL: 44.8 mg/dL (ref >39.00)
LDL Cholesterol: 61 mg/dL (ref 0–99)
NonHDL: 91.2
Triglycerides: 153 mg/dL — ABNORMAL HIGH (ref 0.0–149.0)
VLDL: 30.6 mg/dL (ref 0.0–40.0)

## 2014-09-16 LAB — MICROALBUMIN / CREATININE URINE RATIO
Creatinine,U: 211 mg/dL
MICROALB/CREAT RATIO: 0.8 mg/g (ref 0.0–30.0)
Microalb, Ur: 1.7 mg/dL (ref 0.0–1.9)

## 2014-09-16 LAB — HEMOGLOBIN A1C: Hgb A1c MFr Bld: 6.8 % — ABNORMAL HIGH (ref 4.6–6.5)

## 2014-09-16 MED ORDER — NEXIUM 40 MG PO CPDR: 40.0000 mg | DELAYED_RELEASE_CAPSULE | Freq: Every day | ORAL | Status: DC

## 2014-09-16 MED ORDER — ZOLPIDEM TARTRATE 10 MG PO TABS: 10.0000 mg | ORAL_TABLET | Freq: Every evening | ORAL | Status: DC | PRN

## 2014-09-16 MED ORDER — METFORMIN HCL 500 MG PO TABS: 500.0000 mg | ORAL_TABLET | Freq: Two times a day (BID) | ORAL | Status: DC

## 2014-09-16 MED ORDER — ROSUVASTATIN CALCIUM 20 MG PO TABS
20.0000 mg | ORAL_TABLET | Freq: Every day | ORAL | Status: DC
Start: 2014-09-16 — End: 2015-09-18

## 2014-09-16 MED ORDER — LOSARTAN POTASSIUM-HCTZ 50-12.5 MG PO TABS: 1.0000 | ORAL_TABLET | Freq: Every day | ORAL | Status: DC

## 2014-09-16 MED ORDER — VENLAFAXINE HCL ER 75 MG PO CP24: 75.0000 mg | ORAL_CAPSULE | Freq: Every day | ORAL | Status: DC

## 2014-09-16 MED ORDER — SILODOSIN 8 MG PO CAPS: 8.0000 mg | ORAL_CAPSULE | Freq: Every day | ORAL | Status: DC

## 2014-09-16 NOTE — Assessment & Plan Note (Signed)
Will check A1c with labs today. Continue Metformin. 

## 2014-09-16 NOTE — Assessment & Plan Note (Signed)
Will check lipids with labs today.  

## 2014-09-16 NOTE — Patient Instructions (Signed)
Labs today.  Flu vaccine and Prevnar vaccine today.

## 2014-09-16 NOTE — Addendum Note (Signed)
Addended by: Vernetta Honey on: 09/16/2014 08:43 AM   Modules accepted: Orders

## 2014-09-16 NOTE — Assessment & Plan Note (Signed)
BP Readings from Last 3 Encounters:  09/16/14 112/72  03/04/14 130/88  09/04/13 118/84   BP well controlled on current medications. Renal function with labs today.

## 2014-09-16 NOTE — Progress Notes (Signed)
Subjective:    [name], male    DOB: [birth year], 68 y.o.   MRN: [id number]  HPI 68YO male presents for follow up. Generally feeling well. Had a cold this summer with cough, but this has resolved. Compliant with meds. No concerns.  Review of Systems  Constitutional: Negative for fever, chills, activity change, appetite change, fatigue and unexpected weight change.  Eyes: Negative for visual disturbance.  Respiratory: Negative for cough and shortness of breath.   Cardiovascular: Negative for chest pain, palpitations and leg swelling.  Gastrointestinal: Negative for abdominal pain and abdominal distention.  Genitourinary: Negative for dysuria, urgency and difficulty urinating.  Musculoskeletal: Negative for arthralgias and gait problem.  Skin: Negative for color change and rash.  Hematological: Negative for adenopathy.  Psychiatric/Behavioral: Negative for sleep disturbance and dysphoric mood. The patient is not nervous/anxious.        Objective:    BP 112/72  Pulse 66  Temp(Src) 98.2 F (36.8 C)  Wt 240 lb (108.863 kg)  SpO2 96% Physical Exam  Constitutional: He is oriented to person, place, and time. He appears well-developed and well-nourished. No distress.  HENT:  Head: Normocephalic and atraumatic.  Right Ear: External ear normal.  Left Ear: External ear normal.  Nose: Nose normal.  Mouth/Throat: Oropharynx is clear and moist. No oropharyngeal exudate.  Eyes: Conjunctivae and EOM are normal. Pupils are equal, round, and reactive to light. Right eye exhibits no discharge. Left eye exhibits no discharge. No scleral icterus.  Neck: Normal range of motion. Neck supple. No tracheal deviation present. No thyromegaly present.  Cardiovascular: Normal rate, regular rhythm and normal heart sounds.  Exam reveals no gallop and no friction rub.   No murmur heard. Pulmonary/Chest: Effort normal and breath sounds normal. No accessory muscle usage. Not  tachypneic. No respiratory distress. He has no decreased breath sounds. He has no wheezes. He has no rhonchi. He has no rales. He exhibits no tenderness.  Musculoskeletal: Normal range of motion. He exhibits no edema.  Lymphadenopathy:    He has no cervical adenopathy.  Neurological: He is alert and oriented to person, place, and time. No cranial nerve deficit. Coordination normal.  Skin: Skin is warm and dry. No rash noted. He is not diaphoretic. No erythema. No pallor.  Psychiatric: He has a normal mood and affect. His behavior is normal. Judgment and thought content normal.          Assessment & Plan:   Problem List Items Addressed This Visit     Unprioritized   Diabetes mellitus type 2, controlled - Primary     Will check A1c with labs today. Continue Metformin.    Relevant Medications      metFORMIN (GLUCOPHAGE) tablet      rosuvastatin (CRESTOR) tablet      losartan-hydrochlorothiazide (HYZAAR) 50-12.5 MG per tablet   Other Relevant Orders      Comprehensive metabolic panel      Hemoglobin A1c      Lipid panel      Microalbumin / creatinine urine ratio   Hyperlipidemia     Will check lipids with labs today.    Relevant Medications      rosuvastatin (CRESTOR) tablet      losartan-hydrochlorothiazide (HYZAAR) 50-12.5 MG per tablet   Hypertension      BP Readings from Last 3 Encounters:  09/16/14 112/72  03/04/14 130/88  09/04/13 118/84   BP well controlled on current medications. Renal function with labs today.  Relevant Medications      rosuvastatin (CRESTOR) tablet      losartan-hydrochlorothiazide (HYZAAR) 50-12.5 MG per tablet       Return in about 6 months (around 03/17/2015) for Recheck.

## 2014-09-20 ENCOUNTER — Encounter: Payer: Self-pay | Admitting: Internal Medicine

## 2014-11-21 ENCOUNTER — Telehealth: Payer: Self-pay | Admitting: *Deleted

## 2014-11-21 NOTE — Telephone Encounter (Signed)
Received fax from pt pharmacy requesting - Prilosec to replace nexium, Lipitor to replace crestor and uroxatrol to replace rapafle - Pt is wanting to cut cost of his medications.  Per Dr Gilford Rile pt needs to schedule follow up apt for any medication changes.  Advised pt and transferred him to scheduling to make apt

## 2015-01-13 DIAGNOSIS — I2510 Atherosclerotic heart disease of native coronary artery without angina pectoris: Secondary | ICD-10-CM | POA: Diagnosis not present

## 2015-01-13 DIAGNOSIS — I340 Nonrheumatic mitral (valve) insufficiency: Secondary | ICD-10-CM | POA: Diagnosis not present

## 2015-01-13 DIAGNOSIS — I10 Essential (primary) hypertension: Secondary | ICD-10-CM | POA: Diagnosis not present

## 2015-01-13 DIAGNOSIS — E785 Hyperlipidemia, unspecified: Secondary | ICD-10-CM | POA: Diagnosis not present

## 2015-01-13 DIAGNOSIS — I351 Nonrheumatic aortic (valve) insufficiency: Secondary | ICD-10-CM | POA: Diagnosis not present

## 2015-01-14 DIAGNOSIS — R079 Chest pain, unspecified: Secondary | ICD-10-CM | POA: Diagnosis not present

## 2015-01-16 DIAGNOSIS — E785 Hyperlipidemia, unspecified: Secondary | ICD-10-CM | POA: Diagnosis not present

## 2015-01-16 DIAGNOSIS — R079 Chest pain, unspecified: Secondary | ICD-10-CM | POA: Diagnosis not present

## 2015-01-16 DIAGNOSIS — I10 Essential (primary) hypertension: Secondary | ICD-10-CM | POA: Diagnosis not present

## 2015-01-16 DIAGNOSIS — I340 Nonrheumatic mitral (valve) insufficiency: Secondary | ICD-10-CM | POA: Diagnosis not present

## 2015-02-03 DIAGNOSIS — R072 Precordial pain: Secondary | ICD-10-CM | POA: Diagnosis not present

## 2015-02-03 DIAGNOSIS — R079 Chest pain, unspecified: Secondary | ICD-10-CM | POA: Diagnosis not present

## 2015-02-03 DIAGNOSIS — R9430 Abnormal result of cardiovascular function study, unspecified: Secondary | ICD-10-CM | POA: Diagnosis not present

## 2015-02-05 DIAGNOSIS — I340 Nonrheumatic mitral (valve) insufficiency: Secondary | ICD-10-CM | POA: Diagnosis not present

## 2015-02-05 DIAGNOSIS — I2510 Atherosclerotic heart disease of native coronary artery without angina pectoris: Secondary | ICD-10-CM | POA: Diagnosis not present

## 2015-02-05 DIAGNOSIS — I351 Nonrheumatic aortic (valve) insufficiency: Secondary | ICD-10-CM | POA: Diagnosis not present

## 2015-02-05 DIAGNOSIS — I10 Essential (primary) hypertension: Secondary | ICD-10-CM | POA: Diagnosis not present

## 2015-02-05 DIAGNOSIS — E785 Hyperlipidemia, unspecified: Secondary | ICD-10-CM | POA: Diagnosis not present

## 2015-03-21 ENCOUNTER — Other Ambulatory Visit: Payer: Self-pay | Admitting: Internal Medicine

## 2015-03-21 NOTE — Telephone Encounter (Signed)
Okay to refill? 

## 2015-04-09 ENCOUNTER — Telehealth: Payer: Self-pay | Admitting: *Deleted

## 2015-04-09 MED ORDER — CIPROFLOXACIN HCL 500 MG PO TABS: 500.0000 mg | ORAL_TABLET | Freq: Two times a day (BID) | ORAL | Status: DC

## 2015-04-09 NOTE — Telephone Encounter (Signed)
Please call in Cipro 500mg  po bid #[phone]. For traveller's diarrhea, 3 days of cipro is adequate. For other infections, such as respiratory or skin infections 7 days would be needed.

## 2015-04-09 NOTE — Telephone Encounter (Signed)
Rx sent to pharmacy by escript. Pt notified and  verbalized understanding

## 2015-04-09 NOTE — Telephone Encounter (Signed)
Pt called, going to [place] next week, likes to travel with Cipro 500 mg #10, is currently out and requesting a Rx before he leaves. Uses [facility]. Advised to check with pharmacy in 24 hours and I would contact with any problems,  verbalized understanding

## 2015-05-05 ENCOUNTER — Ambulatory Visit (INDEPENDENT_AMBULATORY_CARE_PROVIDER_SITE_OTHER): Payer: Medicare Other | Admitting: Internal Medicine

## 2015-05-05 ENCOUNTER — Encounter: Payer: Self-pay | Admitting: Internal Medicine

## 2015-05-05 VITALS — BP 120/75 | HR 67 | Temp 97.9°F | Ht 73.25 in | Wt 243.0 lb

## 2015-05-05 DIAGNOSIS — E119 Type 2 diabetes mellitus without complications: Secondary | ICD-10-CM | POA: Diagnosis not present

## 2015-05-05 DIAGNOSIS — I10 Essential (primary) hypertension: Secondary | ICD-10-CM | POA: Diagnosis not present

## 2015-05-05 DIAGNOSIS — E785 Hyperlipidemia, unspecified: Secondary | ICD-10-CM | POA: Diagnosis not present

## 2015-05-05 LAB — COMPREHENSIVE METABOLIC PANEL
ALBUMIN: 4.4 g/dL (ref 3.5–5.2)
ALK PHOS: 73 U/L (ref 39–117)
ALT: 20 U/L (ref 0–53)
AST: 14 U/L (ref 0–37)
BILIRUBIN TOTAL: 0.8 mg/dL (ref 0.2–1.2)
BUN: 13 mg/dL (ref 6–23)
CHLORIDE: 102 meq/L (ref 96–112)
CO2: 28 mEq/L (ref 19–32)
Calcium: 9.8 mg/dL (ref 8.4–10.5)
Creatinine, Ser: 1.01 mg/dL (ref 0.40–1.50)
GFR: 77.86 mL/min (ref >60.00)
Glucose, Bld: 132 mg/dL — ABNORMAL HIGH (ref 70–99)
POTASSIUM: 4.1 meq/L (ref 3.5–5.1)
Sodium: 137 mEq/L (ref 135–145)
TOTAL PROTEIN: 7 g/dL (ref 6.0–8.3)

## 2015-05-05 LAB — LIPID PANEL
CHOLESTEROL: 144 mg/dL (ref 0–200)
HDL: 47 mg/dL (ref >39.00)
LDL CALC: 65 mg/dL (ref 0–99)
NonHDL: 97
TRIGLYCERIDES: 160 mg/dL — AB (ref 0.0–149.0)
Total CHOL/HDL Ratio: 3
VLDL: 32 mg/dL (ref 0.0–40.0)

## 2015-05-05 LAB — MICROALBUMIN / CREATININE URINE RATIO
Creatinine,U: 152.3 mg/dL
MICROALB UR: 0.9 mg/dL (ref 0.0–1.9)
Microalb Creat Ratio: 0.6 mg/g (ref 0.0–30.0)

## 2015-05-05 LAB — HEMOGLOBIN A1C: HEMOGLOBIN A1C: 6.5 % (ref 4.6–6.5)

## 2015-05-05 NOTE — Progress Notes (Signed)
Subjective:    [name], male    DOB: [birth year], 69 y.o.   MRN: [id number]  HPI  69YO male presents for follow up.  Feeling well. Recently back from beach. Compliant with medications. Does not check blood sugars. Not following any specific diet. Not exercising, but does stay active fishing.  Wt Readings from Last 3 Encounters:  05/05/15 243 lb (110.224 kg)  09/16/14 240 lb (108.863 kg)  03/04/14 240 lb (108.863 kg)    Past medical, surgical, family and social history per today's encounter.  Review of Systems  Constitutional: Negative for fever, chills, activity change, appetite change, fatigue and unexpected weight change.  Eyes: Negative for visual disturbance.  Respiratory: Negative for cough and shortness of breath.   Cardiovascular: Negative for chest pain, palpitations and leg swelling.  Gastrointestinal: Negative for nausea, vomiting, abdominal pain, diarrhea, constipation and abdominal distention.  Genitourinary: Negative for dysuria, urgency and difficulty urinating.  Musculoskeletal: Negative for arthralgias and gait problem.  Skin: Negative for color change and rash.  Hematological: Negative for adenopathy.  Psychiatric/Behavioral: Negative for sleep disturbance and dysphoric mood. The patient is not nervous/anxious.        Objective:    BP 120/75 mmHg  Pulse 67  Temp(Src) 97.9 F (36.6 C) (Oral)  Ht 6' 1.25" (1.861 m)  Wt 243 lb (110.224 kg)  BMI 31.83 kg/m2  SpO2 95% Physical Exam  Constitutional: He is oriented to person, place, and time. He appears well-developed and well-nourished. No distress.  HENT:  Head: Normocephalic and atraumatic.  Right Ear: External ear normal.  Left Ear: External ear normal.  Nose: Nose normal.  Mouth/Throat: Oropharynx is clear and moist. No oropharyngeal exudate.  Eyes: Conjunctivae and EOM are normal. Pupils are equal, round, and reactive to light. Right eye exhibits no discharge. Left eye  exhibits no discharge. No scleral icterus.  Neck: Normal range of motion. Neck supple. No tracheal deviation present. No thyromegaly present.  Cardiovascular: Normal rate, regular rhythm and normal heart sounds.  Exam reveals no gallop and no friction rub.   No murmur heard. Pulmonary/Chest: Effort normal and breath sounds normal. No accessory muscle usage. No tachypnea. No respiratory distress. He has no decreased breath sounds. He has no wheezes. He has no rhonchi. He has no rales. He exhibits no tenderness.  Musculoskeletal: Normal range of motion. He exhibits no edema.  Lymphadenopathy:    He has no cervical adenopathy.  Neurological: He is alert and oriented to person, place, and time. No cranial nerve deficit. Coordination normal.  Skin: Skin is warm and dry. No rash noted. He is not diaphoretic. No erythema. No pallor.  Psychiatric: He has a normal mood and affect. His behavior is normal. Judgment and thought content normal.          Assessment & Plan:   Problem List Items Addressed This Visit      Unprioritized   Diabetes mellitus type 2, controlled - Primary    Will check A1c with labs. Continue Metformin. Encouraged healthy diet and exercise.      Relevant Orders   Comprehensive metabolic panel   Hemoglobin A1c   Lipid panel   Microalbumin / creatinine urine ratio   Hyperlipidemia    Will check lipids with labs. Continue Crestor.      Hypertension    BP Readings from Last 3 Encounters:  05/05/15 120/75  09/16/14 112/72  03/04/14 130/88   BP well controlled. Continue current medications. Renal function with labs.  Return in about 6 months (around 11/05/2015) for Wellness Visit.

## 2015-05-05 NOTE — Progress Notes (Signed)
Pre visit review using our clinic review tool, if applicable. No additional management support is needed unless otherwise documented below in the visit note. 

## 2015-05-05 NOTE — Patient Instructions (Addendum)
Labs today.  Follow up in 6 months. 

## 2015-05-05 NOTE — Assessment & Plan Note (Signed)
BP Readings from Last 3 Encounters:  05/05/15 120/75  09/16/14 112/72  03/04/14 130/88   BP well controlled. Continue current medications. Renal function with labs.

## 2015-05-05 NOTE — Assessment & Plan Note (Signed)
Will check A1c with labs. Continue Metformin. Encouraged healthy diet and exercise.

## 2015-05-05 NOTE — Assessment & Plan Note (Signed)
Will check lipids with labs. Continue Crestor. 

## 2015-06-15 ENCOUNTER — Other Ambulatory Visit: Payer: Self-pay

## 2015-06-19 DIAGNOSIS — M25562 Pain in left knee: Secondary | ICD-10-CM | POA: Diagnosis not present

## 2015-07-20 DIAGNOSIS — M25562 Pain in left knee: Secondary | ICD-10-CM | POA: Diagnosis not present

## 2015-08-06 DIAGNOSIS — I10 Essential (primary) hypertension: Secondary | ICD-10-CM | POA: Diagnosis not present

## 2015-08-06 DIAGNOSIS — E119 Type 2 diabetes mellitus without complications: Secondary | ICD-10-CM | POA: Diagnosis not present

## 2015-08-06 DIAGNOSIS — H35039 Hypertensive retinopathy, unspecified eye: Secondary | ICD-10-CM | POA: Diagnosis not present

## 2015-08-06 DIAGNOSIS — H5203 Hypermetropia, bilateral: Secondary | ICD-10-CM | POA: Diagnosis not present

## 2015-08-13 DIAGNOSIS — M25562 Pain in left knee: Secondary | ICD-10-CM | POA: Diagnosis not present

## 2015-08-19 DIAGNOSIS — M25562 Pain in left knee: Secondary | ICD-10-CM | POA: Diagnosis not present

## 2015-08-20 DIAGNOSIS — M25562 Pain in left knee: Secondary | ICD-10-CM | POA: Diagnosis not present

## 2015-09-18 ENCOUNTER — Other Ambulatory Visit: Payer: Self-pay | Admitting: Internal Medicine

## 2015-09-19 DIAGNOSIS — M25562 Pain in left knee: Secondary | ICD-10-CM | POA: Diagnosis not present

## 2015-09-21 DIAGNOSIS — M25562 Pain in left knee: Secondary | ICD-10-CM | POA: Diagnosis not present

## 2015-10-01 DIAGNOSIS — M25562 Pain in left knee: Secondary | ICD-10-CM | POA: Diagnosis not present

## 2015-10-06 ENCOUNTER — Ambulatory Visit: Payer: Self-pay | Admitting: Internal Medicine

## 2015-10-07 DIAGNOSIS — M25562 Pain in left knee: Secondary | ICD-10-CM | POA: Diagnosis not present

## 2015-10-16 DIAGNOSIS — M25562 Pain in left knee: Secondary | ICD-10-CM | POA: Diagnosis not present

## 2015-10-24 ENCOUNTER — Other Ambulatory Visit: Payer: Self-pay | Admitting: Internal Medicine

## 2015-10-26 ENCOUNTER — Ambulatory Visit: Payer: Self-pay | Admitting: Internal Medicine

## 2015-10-26 NOTE — Telephone Encounter (Signed)
Last OV 03/22/15, Last Rx 03/22/15, #30 / 4refill.... Next scheduled OV 11/03/15

## 2015-10-27 ENCOUNTER — Encounter: Payer: Self-pay | Admitting: Internal Medicine

## 2015-10-27 ENCOUNTER — Ambulatory Visit (INDEPENDENT_AMBULATORY_CARE_PROVIDER_SITE_OTHER): Payer: Medicare Other

## 2015-10-27 DIAGNOSIS — Z23 Encounter for immunization: Secondary | ICD-10-CM | POA: Diagnosis not present

## 2015-11-03 ENCOUNTER — Ambulatory Visit: Payer: Self-pay | Admitting: Internal Medicine

## 2015-11-06 ENCOUNTER — Ambulatory Visit: Payer: Self-pay | Admitting: Internal Medicine

## 2015-11-09 ENCOUNTER — Ambulatory Visit (INDEPENDENT_AMBULATORY_CARE_PROVIDER_SITE_OTHER): Payer: Medicare Other

## 2015-11-09 VITALS — BP 120/78 | HR 61 | Temp 97.1°F | Resp 12 | Ht 74.0 in | Wt 240.8 lb

## 2015-11-09 DIAGNOSIS — Z0000 Encounter for general adult medical examination without abnormal findings: Secondary | ICD-10-CM | POA: Diagnosis not present

## 2015-11-09 DIAGNOSIS — N400 Enlarged prostate without lower urinary tract symptoms: Secondary | ICD-10-CM

## 2015-11-09 DIAGNOSIS — R7309 Other abnormal glucose: Secondary | ICD-10-CM

## 2015-11-09 DIAGNOSIS — Z1159 Encounter for screening for other viral diseases: Secondary | ICD-10-CM

## 2015-11-09 DIAGNOSIS — R739 Hyperglycemia, unspecified: Secondary | ICD-10-CM

## 2015-11-09 LAB — HEMOGLOBIN A1C
Hgb A1c MFr Bld: 6.5 % — ABNORMAL HIGH (ref <5.7)
Mean Plasma Glucose: 140 mg/dL — ABNORMAL HIGH (ref <117)

## 2015-11-09 NOTE — Progress Notes (Signed)
Annual Wellness Visit as completed by Health Coach was reviewed in full.  

## 2015-11-09 NOTE — Patient Instructions (Addendum)
Mr. Bruce Wyatt,  Thank you for taking time to come for your Medicare Wellness Visit.  I appreciate your ongoing commitment to your health goals. Please review the following plan we discussed and let me know if I can assist you in the future.  Bring a copy of Advance directives   Health Maintenance, Male A healthy lifestyle and preventative care can promote health and wellness.  Maintain regular health, dental, and eye exams.  Eat a healthy diet. Foods like vegetables, fruits, whole grains, low-fat dairy products, and lean protein foods contain the nutrients you need and are low in calories. Decrease your intake of foods high in solid fats, added sugars, and salt. Get information about a proper diet from your health care provider, if necessary.  Regular physical exercise is one of the most important things you can do for your health. Most adults should get at least 150 minutes of moderate-intensity exercise (any activity that increases your heart rate and causes you to sweat) each week. In addition, most adults need muscle-strengthening exercises on 2 or more days a week.   Maintain a healthy weight. The body mass index (BMI) is a screening tool to identify possible weight problems. It provides an estimate of body fat based on height and weight. Your health care provider can find your BMI and can help you achieve or maintain a healthy weight. For males 20 years and older:  A BMI below 18.5 is considered underweight.  A BMI of 18.5 to 24.9 is normal.  A BMI of 25 to 29.9 is considered overweight.  A BMI of 30 and above is considered obese.  Maintain normal blood lipids and cholesterol by exercising and minimizing your intake of saturated fat. Eat a balanced diet with plenty of fruits and vegetables. Blood tests for lipids and cholesterol should begin at age 66 and be repeated every 5 years. If your lipid or cholesterol levels are high, you are over age 54, or you are at high risk for heart  disease, you may need your cholesterol levels checked more frequently.Ongoing high lipid and cholesterol levels should be treated with medicines if diet and exercise are not working.  If you smoke, find out from your health care provider how to quit. If you do not use tobacco, do not start.  Lung cancer screening is recommended for adults aged 59-80 years who are at high risk for developing lung cancer because of a history of smoking. A yearly low-dose CT scan of the lungs is recommended for people who have at least a 30-pack-year history of smoking and are current smokers or have quit within the past 15 years. A pack year of smoking is smoking an average of 1 pack of cigarettes a day for 1 year (for example, a 30-pack-year history of smoking could mean smoking 1 pack a day for 30 years or 2 packs a day for 15 years). Yearly screening should continue until the smoker has stopped smoking for at least 15 years. Yearly screening should be stopped for people who develop a health problem that would prevent them from having lung cancer treatment.  If you choose to drink alcohol, do not have more than 2 drinks per day. One drink is considered to be 12 oz (360 mL) of beer, 5 oz (150 mL) of wine, or 1.5 oz (45 mL) of liquor.  Avoid the use of street drugs. Do not share needles with anyone. Ask for help if you need support or instructions about stopping the use of  drugs.  High blood pressure causes heart disease and increases the risk of stroke. High blood pressure is more likely to develop in:  People who have blood pressure in the end of the normal range (100-139/85-89 mm Hg).  People who are overweight or obese.  People who are African American.  If you are 60-71 years of age, have your blood pressure checked every 3-5 years. If you are 85 years of age or older, have your blood pressure checked every year. You should have your blood pressure measured twice--once when you are at a hospital or clinic, and  once when you are not at a hospital or clinic. Record the average of the two measurements. To check your blood pressure when you are not at a hospital or clinic, you can use:  An automated blood pressure machine at a pharmacy.  A home blood pressure monitor.  If you are 13-5 years old, ask your health care provider if you should take aspirin to prevent heart disease.  Diabetes screening involves taking a blood sample to check your fasting blood sugar level. This should be done once every 3 years after age 68 if you are at a normal weight and without risk factors for diabetes. Testing should be considered at a younger age or be carried out more frequently if you are overweight and have at least 1 risk factor for diabetes.  Colorectal cancer can be detected and often prevented. Most routine colorectal cancer screening begins at the age of 28 and continues through age 37. However, your health care provider may recommend screening at an earlier age if you have risk factors for colon cancer. On a yearly basis, your health care provider may provide home test kits to check for hidden blood in the stool. A small camera at the end of a tube may be used to directly examine the colon (sigmoidoscopy or colonoscopy) to detect the earliest forms of colorectal cancer. Talk to your health care provider about this at age 65 when routine screening begins. A direct exam of the colon should be repeated every 5-10 years through age 70, unless early forms of precancerous polyps or small growths are found.  People who are at an increased risk for hepatitis B should be screened for this virus. You are considered at high risk for hepatitis B if:  You were born in a country where hepatitis B occurs often. Talk with your health care provider about which countries are considered high risk.  Your parents were born in a high-risk country and you have not received a shot to protect against hepatitis B (hepatitis B  vaccine).  You have HIV or AIDS.  You use needles to inject street drugs.  You live with, or have sex with, someone who has hepatitis B.  You are a man who has sex with other men (MSM).  You get hemodialysis treatment.  You take certain medicines for conditions like cancer, organ transplantation, and autoimmune conditions.  Hepatitis C blood testing is recommended for all people born from [birth year] through 1965 and any individual with known risk factors for hepatitis C.  Healthy men should no longer receive prostate-specific antigen (PSA) blood tests as part of routine cancer screening. Talk to your health care provider about prostate cancer screening.  Testicular cancer screening is not recommended for adolescents or adult males who have no symptoms. Screening includes self-exam, a health care provider exam, and other screening tests. Consult with your health care provider about any symptoms you have  or any concerns you have about testicular cancer.  Practice safe sex. Use condoms and avoid high-risk sexual practices to reduce the spread of sexually transmitted infections (STIs).  You should be screened for STIs, including gonorrhea and chlamydia if:  You are sexually active and are younger than 24 years.  You are older than 24 years, and your health care provider tells you that you are at risk for this type of infection.  Your sexual activity has changed since you were last screened, and you are at an increased risk for chlamydia or gonorrhea. Ask your health care provider if you are at risk.  If you are at risk of being infected with HIV, it is recommended that you take a prescription medicine daily to prevent HIV infection. This is called pre-exposure prophylaxis (PrEP). You are considered at risk if:  You are a man who has sex with other men (MSM).  You are a heterosexual man who is sexually active with multiple partners.  You take drugs by injection.  You are sexually active  with a partner who has HIV.  Talk with your health care provider about whether you are at high risk of being infected with HIV. If you choose to begin PrEP, you should first be tested for HIV. You should then be tested every 3 months for as long as you are taking PrEP.  Use sunscreen. Apply sunscreen liberally and repeatedly throughout the day. You should seek shade when your shadow is shorter than you. Protect yourself by wearing long sleeves, pants, a wide-brimmed hat, and sunglasses year round whenever you are outdoors.  Tell your health care provider of new moles or changes in moles, especially if there is a change in shape or color. Also, tell your health care provider if a mole is larger than the size of a pencil eraser.  A one-time screening for abdominal aortic aneurysm (AAA) and surgical repair of large AAAs by ultrasound is recommended for men aged 63-75 years who are current or former smokers.  Stay current with your vaccines (immunizations).   This information is not intended to replace advice given to you by your health care provider. Make sure you discuss any questions you have with your health care provider.   Document Released: 06/02/2008 Document Revised: 12/26/2014 Document Reviewed: 05/02/2011 Elsevier Interactive Patient Education 2016 [employer].  Hearing Loss Hearing loss is a partial or total loss of the ability to hear. This can be temporary or permanent, and it can happen in one or both ears. Hearing loss may be referred to as deafness. Medical care is necessary to treat hearing loss properly and to prevent the condition from getting worse. Your hearing may partially or completely come back, depending on what caused your hearing loss and how severe it is. In some cases, hearing loss is permanent. CAUSES Common causes of hearing loss include:   Too much wax in the ear canal.   Infection of the ear canal or middle ear.   Fluid in the middle ear.   Injury to  the ear or surrounding area.   An object stuck in the ear.   Prolonged exposure to loud sounds, such as music.  Less common causes of hearing loss include:   Tumors in the ear.   Viral or bacterial infections, such as meningitis.   A hole in the eardrum (perforated eardrum).  Problems with the hearing nerve that sends signals between the brain and the ear.  Certain medicines.  SYMPTOMS  Symptoms  of this condition may include:  Difficulty telling the difference between sounds.  Difficulty following a conversation when there is background noise.  Lack of response to sounds in your environment. This may be most noticeable when you do not respond to startling sounds.  Needing to turn up the volume on the television, radio, etc.  Ringing in the ears.  Dizziness.  Pain in the ears. DIAGNOSIS This condition is diagnosed based on a physical exam and a hearing test (audiometry). The audiometry test will be performed by a hearing specialist (audiologist). You may also be referred to an ear, nose, and throat (ENT) specialist (otolaryngologist).  TREATMENT Treatment for recent onset of hearing loss may include:   Ear wax removal.   Being prescribed medicines to prevent infection (antibiotics).   Being prescribed medicines to reduce inflammation (corticosteroids).  HOME CARE INSTRUCTIONS  If you were prescribed an antibiotic medicine, take it as told by your health care provider. Do not stop taking the antibiotic even if you start to feel better.  Take over-the-counter and prescription medicines only as told by your health care provider.  Avoid loud noises.   Return to your normal activities as told by your health care provider. Ask your health care provider what activities are safe for you.  Keep all follow-up visits as told by your health care provider. This is important. SEEK MEDICAL CARE IF:   You feel dizzy.   You develop new symptoms.   You vomit or  feel nauseous.   You have a fever.  SEEK IMMEDIATE MEDICAL CARE IF:  You develop sudden changes in your vision.   You have severe ear pain.   You have new or increased weakness.  You have a severe headache.   This information is not intended to replace advice given to you by your health care provider. Make sure you discuss any questions you have with your health care provider.   Document Released: 12/05/2005 Document Revised: 08/26/2015 Document Reviewed: 04/22/2015 Elsevier Interactive Patient Education [employer].

## 2015-11-09 NOTE — Progress Notes (Signed)
Subjective:   Bruce Wyatt. is a 69 y.o. male who presents for Medicare Annual/Subsequent preventive examination.  Review of Systems:  No ROS.  Medicare Wellness Visit. Cardiac Risk Factors include: advanced age (>52men, >28 women);male gender;diabetes mellitus     Objective:    Vitals: BP 120/78 mmHg  Pulse 61  Temp(Src) 97.1 F (36.2 C) (Oral)  Resp 12  Ht 6\' 2"  (1.88 m)  Wt 240 lb 12.8 oz (109.226 kg)  BMI 30.90 kg/m2  SpO2 95%  Tobacco History  Smoking status  . Never Smoker   Smokeless tobacco  . Never Used     Counseling given: Not Answered   Past Medical History  Diagnosis Date  . Hypertension   . Hyperlipidemia   . Diabetes mellitus   . Abnormal colonoscopy     polyps noted, benign, [facility], rec 10 year follow up  . GERD (gastroesophageal reflux disease)   . Iron deficiency   . Vitamin D deficiency   . Gallstones   . H/O hematuria   . Sleep apnea   . Diverticulosis   . Hemorrhoids   . Alcoholism ([place])     11 yrs ago   . BPH (benign prostatic hyperplasia)    Past Surgical History  Procedure Laterality Date  . Soft palate reduction for sleep apnea      [place]  . Vasectomy    . Tonsillectomy    . Cardiovascular stress test  02/12/2009    Negative/AT [facility] Dr Cherre Robins  . Cystoscopy  07/03/2012  . Rhinoplasty      soft palate reduction for apnea  . Colonoscopy    . Upper gastrointestinal endoscopy     Family History  Problem Relation Age of Onset  . Breast cancer Mother   . Heart disease Father     Heart Attack  . Colon cancer Maternal Grandfather   . Diabetes Paternal Grandfather   . Heart disease Paternal Grandfather   . Coronary artery disease Father   . Nephrolithiasis Daughter    History  Sexual Activity  . Sexual Activity: Not Currently    Outpatient Encounter Prescriptions as of 11/09/2015  Medication Sig  . esomeprazole (NEXIUM) 40 MG capsule TAKE ONE CAPSULE BY MOUTH EVERY DAY AT 12 NOON  .  losartan-hydrochlorothiazide (HYZAAR) 50-12.5 MG tablet TAKE ONE TABLET BY MOUTH EVERY DAY  . metFORMIN (GLUCOPHAGE) 500 MG tablet TAKE 1 TABLET TWICE DAILY WITH MEALS  . Multiple Vitamins-Minerals (MULTIVITAMIN WITH MINERALS) tablet Take 1 tablet by mouth daily.  . rosuvastatin (CRESTOR) 20 MG tablet TAKE ONE TABLET BY MOUTH EVERY DAY  . silodosin (RAPAFLO) 8 MG CAPS capsule Take 1 capsule (8 mg total) by mouth daily with breakfast.  . venlafaxine XR (EFFEXOR-XR) 75 MG 24 hr capsule TAKE ONE CAPSULE BY MOUTH EVERY DAY  . zolpidem (AMBIEN) 10 MG tablet TAKE ONE TABLET BY MOUTH AT BEDTIME AS NEEDED   No facility-administered encounter medications on file as of 11/09/2015.    Activities of Daily Living In your present state of health, do you have any difficulty performing the following activities: 11/09/2015 11/09/2015  Hearing? - N  Vision? - N  Difficulty concentrating or making decisions? - N  Walking or climbing stairs? - N  Dressing or bathing? - N  Doing errands, shopping? - N  Conservation officer, nature and eating ? - N  Using the Toilet? - N  In the past six months, have you accidently leaked urine? (No Data) N  Do you  have problems with loss of bowel control? - N  Managing your Medications? - N  Managing your Finances? - N  Housekeeping or managing your Housekeeping? - N    Patient Care Team: Jackolyn Confer, MD as PCP - General (Internal Medicine)   Assessment:    This is a routine wellness examination for Bruce Wyatt. The goal of the wellness visit is to assist the patient how to close the gaps in care and create a preventative care plan for the patient.   Bone Density/Risk for Osteoporosis discussed/reviewed.  Taking meds without issues; no barriers identified.  Labs completed today: Hep C Screening, PSA, A1C  Safety issues reviewed; smoke detectors in the home. Firearms in the home secured and in a locked area. Wears seatbelts when driving or riding with others. No violence  in the home.  The patient was oriented x 3; appropriate in dress and manner and no objective failures at ADL's or IADL's.   Patient Concerns: Urinary urgency has worsened, but able to maintain control.  Manageable with increased use of current medication RAPAFLO.  Deferred to PCP for follow up.      Exercise Activities and Dietary recommendations Current Exercise Habits:: The patient does not participate in regular exercise at present (Does yard work and goes [employer] )  Goals    . Increase physical activity     Active coastal fishing and doing yard work during the spring and summer months.  Patient centered goal is to walk 30 minutes 3 days weekly as tolerated during the off season.    . Increase water intake     Currently drinks 1 cup of water and 3 cups of black coffee daily.  Increase water intake up to 2 bottles =4 cups daily      Fall Risk Fall Risk  11/09/2015 05/05/2015 03/04/2014 04/03/2013  Falls in the past year? No No No No   Depression Screen PHQ 2/9 Scores 11/09/2015 05/05/2015 03/04/2014 04/03/2013  PHQ - 2 Score 0 0 0 0    Cognitive Testing MMSE - Mini Mental State Exam 11/09/2015  Orientation to time 5  Orientation to Place 5  Registration 3  Attention/ Calculation 5  Recall 3  Language- name 2 objects 2  Language- repeat 1  Language- follow 3 step command 3  Language- read & follow direction 1  Write a sentence 1  Copy design 1  Total score 30    Immunization History  Administered Date(s) Administered  . Influenza Split 08/20/2011, 10/03/2012  . Influenza,inj,Quad PF,36+ Mos 09/04/2013, 09/16/2014, 10/27/2015  . Pneumococcal Conjugate-13 09/16/2014  . Pneumococcal Polysaccharide-23 11/05/2006, 09/04/2013  . Tdap 12/19/2010  . Zoster 10/19/2006   Screening Tests Health Maintenance  Topic Date Due  . HEMOGLOBIN A1C  11/05/2015  . FOOT EXAM  05/03/2016  . URINE MICROALBUMIN  05/04/2016  . INFLUENZA VACCINE  07/19/2016  . OPHTHALMOLOGY EXAM   08/03/2016  . COLONOSCOPY  04/16/2019  . TETANUS/TDAP  12/19/2020  . ZOSTAVAX  Completed  . Hepatitis C Screening  Completed  . PNA vac Low Risk Adult  Completed      Plan:    End of life planning; Advance aging; Advanced directives was discussed. Plans to return a current copy of Living Will.  Return for scheduled follow up with PCP  During the course of the visit the patient was educated and counseled about the following appropriate screening and preventive services:   Vaccines to include Pneumoccal, Influenza, Hepatitis B, Td, Zostavax, HCV  Electrocardiogram  Cardiovascular Disease  Colorectal cancer screening  Diabetes screening  Prostate Cancer Screening  Glaucoma screening  Nutrition counseling   Smoking cessation counseling  Patient Instructions (the written plan) was given to the patient.    Varney Biles, LPN  [id number]   .

## 2015-11-10 LAB — HEPATITIS C ANTIBODY: HCV AB: NEGATIVE

## 2015-11-10 LAB — PSA, MEDICARE: PSA: 1.53 ng/mL (ref <=4.00)

## 2015-11-17 ENCOUNTER — Other Ambulatory Visit: Payer: Self-pay | Admitting: Internal Medicine

## 2015-12-07 ENCOUNTER — Ambulatory Visit (INDEPENDENT_AMBULATORY_CARE_PROVIDER_SITE_OTHER): Payer: Medicare Other | Admitting: Internal Medicine

## 2015-12-07 ENCOUNTER — Encounter: Payer: Self-pay | Admitting: Internal Medicine

## 2015-12-07 VITALS — BP 118/80 | HR 66 | Temp 97.7°F | Resp 18 | Ht 73.25 in | Wt 241.1 lb

## 2015-12-07 DIAGNOSIS — J209 Acute bronchitis, unspecified: Secondary | ICD-10-CM | POA: Diagnosis not present

## 2015-12-07 MED ORDER — PREDNISONE 10 MG PO TABS: ORAL_TABLET | ORAL | Status: DC

## 2015-12-07 MED ORDER — HYDROCOD POLST-CPM POLST ER 10-8 MG/5ML PO SUER: 5.0000 mL | Freq: Two times a day (BID) | ORAL | Status: DC | PRN

## 2015-12-07 MED ORDER — AMOXICILLIN-POT CLAVULANATE 875-125 MG PO TABS: 1.0000 | ORAL_TABLET | Freq: Two times a day (BID) | ORAL | Status: DC

## 2015-12-07 NOTE — Assessment & Plan Note (Signed)
Symptoms most consistent with viral URI with secondary bronchitis and maxillary sinusitis.  Will start Augmentin and Prednisone taper. Tussionex for cough. Discussed potential benefits and risks of the medications. Follow up if symptoms not improving this week.

## 2015-12-07 NOTE — Progress Notes (Signed)
Subjective:    [name], male    DOB: [birth year], 69 y.o.   MRN: [id number]  HPI  69YO male presents for acute visit.  Cough - Congestion and cough started about 7 days ago. Notes he was fishing with a child who was sick about 1.5 weeks ago.  Symptoms of congestion have gradually worsened. Notes facial pain, teeth pain. Purulent drainage. Cough occasionally productive. No chest pain. Took two left over prednisone tabs on last Sat and Sun. Also took Tessalon with minimal improvement. No fever, chills. No dyspnea. Nonsmoker, however his parents were smokers.    Wt Readings from Last 3 Encounters:  12/07/15 241 lb 2 oz (109.374 kg)  11/09/15 240 lb 12.8 oz (109.226 kg)  05/05/15 243 lb (110.224 kg)   BP Readings from Last 3 Encounters:  12/07/15 118/80  11/09/15 120/78  05/05/15 120/75    Past Medical History  Diagnosis Date  . Hypertension   . Hyperlipidemia   . Diabetes mellitus   . Abnormal colonoscopy     polyps noted, benign, [facility], rec 10 year follow up  . GERD (gastroesophageal reflux disease)   . Iron deficiency   . Vitamin D deficiency   . Gallstones   . H/O hematuria   . Sleep apnea   . Diverticulosis   . Hemorrhoids   . Alcoholism ([place])     11 yrs ago   . BPH (benign prostatic hyperplasia)    Family History  Problem Relation Age of Onset  . Breast cancer Mother   . Heart disease Father     Heart Attack  . Colon cancer Maternal Grandfather   . Diabetes Paternal Grandfather   . Heart disease Paternal Grandfather   . Coronary artery disease Father   . Nephrolithiasis Daughter    Past Surgical History  Procedure Laterality Date  . Soft palate reduction for sleep apnea      [place]  . Vasectomy    . Tonsillectomy    . Cardiovascular stress test  02/12/2009    Negative/AT [facility] Dr Cherre Robins  . Cystoscopy  07/03/2012  . Rhinoplasty      soft palate reduction for apnea  . Colonoscopy    . Upper gastrointestinal  endoscopy     Social History   Social History  . Marital Status: Married    Spouse Name: N/A  . Number of Children: 2  . Years of Education: N/A   Occupational History  . VP of [employer]    Social History Main Topics  . Smoking status: Never Smoker   . Smokeless tobacco: Never Used  . Alcohol Use: No     Comment: quit 2002  . Drug Use: No  . Sexual Activity: Not Currently   Other Topics Concern  . None   Social History Narrative   Lives in [place], [place] with wife.    2 daughters   No pets. Hobbies: fishing.   Textile management, [employer]   Regular Exercise -  NO   Daily Caffeine Use:  2 -4 coffee    No current alcohol history of alcoholism   No tobacco    Review of Systems  Constitutional: Positive for fatigue. Negative for fever, chills, activity change, appetite change and unexpected weight change.  HENT: Positive for congestion, postnasal drip, rhinorrhea and sinus pressure. Negative for ear discharge, ear pain, hearing loss, nosebleeds, sneezing, sore throat, tinnitus, trouble swallowing and voice change.   Eyes: Negative for discharge, redness, itching  and visual disturbance.  Respiratory: Positive for cough. Negative for chest tightness, shortness of breath, wheezing and stridor.   Cardiovascular: Negative for chest pain, palpitations and leg swelling.  Gastrointestinal: Negative for abdominal pain and abdominal distention.  Genitourinary: Negative for dysuria, urgency and difficulty urinating.  Musculoskeletal: Negative for myalgias, arthralgias, gait problem, neck pain and neck stiffness.  Skin: Negative for color change and rash.  Neurological: Negative for dizziness, facial asymmetry and headaches.  Hematological: Negative for adenopathy.  Psychiatric/Behavioral: Positive for sleep disturbance. Negative for dysphoric mood. The patient is not nervous/anxious.        Objective:    BP 118/80 mmHg  Pulse 66  Temp(Src) 97.7 F (36.5 C) (Oral)  Resp 18  Ht 6'  1.25" (1.861 m)  Wt 241 lb 2 oz (109.374 kg)  BMI 31.58 kg/m2  SpO2 97% Physical Exam  Constitutional: He is oriented to person, place, and time. He appears well-developed and well-nourished. No distress.  HENT:  Head: Normocephalic and atraumatic.  Right Ear: External ear normal.  Left Ear: External ear normal.  Nose: Mucosal edema and rhinorrhea present. Right sinus exhibits maxillary sinus tenderness. Left sinus exhibits maxillary sinus tenderness.  Mouth/Throat: Oropharynx is clear and moist. No oropharyngeal exudate.  Eyes: Conjunctivae and EOM are normal. Pupils are equal, round, and reactive to light. Right eye exhibits no discharge. Left eye exhibits no discharge. No scleral icterus.  Neck: Normal range of motion. Neck supple. No tracheal deviation present. No thyromegaly present.  Cardiovascular: Normal rate, regular rhythm and normal heart sounds.  Exam reveals no gallop and no friction rub.   No murmur heard. Pulmonary/Chest: Effort normal. No accessory muscle usage. No tachypnea. No respiratory distress. He has decreased breath sounds. He has no wheezes. He has rhonchi (scattered). He has no rales. He exhibits no tenderness.  Musculoskeletal: Normal range of motion. He exhibits no edema.  Lymphadenopathy:    He has no cervical adenopathy.  Neurological: He is alert and oriented to person, place, and time. No cranial nerve deficit. Coordination normal.  Skin: Skin is warm and dry. No rash noted. He is not diaphoretic. No erythema. No pallor.  Psychiatric: He has a normal mood and affect. His behavior is normal. Judgment and thought content normal.          Assessment & Plan:   Problem List Items Addressed This Visit      Unprioritized   Acute bronchitis - Primary    Symptoms most consistent with viral URI with secondary bronchitis and maxillary sinusitis.  Will start Augmentin and Prednisone taper. Tussionex for cough. Discussed potential benefits and risks of the  medications. Follow up if symptoms not improving this week.      Relevant Medications   predniSONE (DELTASONE) 10 MG tablet   amoxicillin-clavulanate (AUGMENTIN) 875-125 MG tablet   chlorpheniramine-HYDROcodone (TUSSIONEX PENNKINETIC ER) 10-8 MG/5ML SUER       Return if symptoms worsen or fail to improve.

## 2015-12-07 NOTE — Progress Notes (Signed)
Pre-visit discussion using our clinic review tool. No additional management support is needed unless otherwise documented below in the visit note.  

## 2015-12-07 NOTE — Patient Instructions (Signed)
Start Augmentin twice daily.  Start Prednisone taper.  Use Tussionex as needed for cough.  Follow up if symptoms are not improving.

## 2016-02-04 DIAGNOSIS — E785 Hyperlipidemia, unspecified: Secondary | ICD-10-CM | POA: Diagnosis not present

## 2016-02-04 DIAGNOSIS — I10 Essential (primary) hypertension: Secondary | ICD-10-CM | POA: Diagnosis not present

## 2016-02-04 DIAGNOSIS — I739 Peripheral vascular disease, unspecified: Secondary | ICD-10-CM | POA: Diagnosis not present

## 2016-02-04 DIAGNOSIS — R0602 Shortness of breath: Secondary | ICD-10-CM | POA: Diagnosis not present

## 2016-02-04 DIAGNOSIS — R55 Syncope and collapse: Secondary | ICD-10-CM | POA: Diagnosis not present

## 2016-02-04 DIAGNOSIS — I2510 Atherosclerotic heart disease of native coronary artery without angina pectoris: Secondary | ICD-10-CM | POA: Diagnosis not present

## 2016-02-15 DIAGNOSIS — I739 Peripheral vascular disease, unspecified: Secondary | ICD-10-CM | POA: Diagnosis not present

## 2016-02-16 DIAGNOSIS — Z01810 Encounter for preprocedural cardiovascular examination: Secondary | ICD-10-CM | POA: Diagnosis not present

## 2016-05-10 ENCOUNTER — Ambulatory Visit (INDEPENDENT_AMBULATORY_CARE_PROVIDER_SITE_OTHER): Payer: Medicare Other | Admitting: Internal Medicine

## 2016-05-10 ENCOUNTER — Encounter: Payer: Self-pay | Admitting: Internal Medicine

## 2016-05-10 VITALS — BP 128/82 | HR 73 | Ht 73.25 in | Wt 244.0 lb

## 2016-05-10 DIAGNOSIS — E119 Type 2 diabetes mellitus without complications: Secondary | ICD-10-CM | POA: Diagnosis not present

## 2016-05-10 DIAGNOSIS — I10 Essential (primary) hypertension: Secondary | ICD-10-CM

## 2016-05-10 DIAGNOSIS — R251 Tremor, unspecified: Secondary | ICD-10-CM

## 2016-05-10 DIAGNOSIS — L84 Corns and callosities: Secondary | ICD-10-CM | POA: Insufficient documentation

## 2016-05-10 DIAGNOSIS — E785 Hyperlipidemia, unspecified: Secondary | ICD-10-CM

## 2016-05-10 LAB — COMPREHENSIVE METABOLIC PANEL
ALT: 20 U/L (ref 0–53)
AST: 13 U/L (ref 0–37)
Albumin: 4.5 g/dL (ref 3.5–5.2)
Alkaline Phosphatase: 57 U/L (ref 39–117)
BUN: 17 mg/dL (ref 6–23)
CHLORIDE: 107 meq/L (ref 96–112)
CO2: 25 mEq/L (ref 19–32)
CREATININE: 0.93 mg/dL (ref 0.40–1.50)
Calcium: 9.2 mg/dL (ref 8.4–10.5)
GFR: 85.38 mL/min (ref >60.00)
Glucose, Bld: 134 mg/dL — ABNORMAL HIGH (ref 70–99)
Potassium: 3.9 mEq/L (ref 3.5–5.1)
SODIUM: 140 meq/L (ref 135–145)
Total Bilirubin: 1 mg/dL (ref 0.2–1.2)
Total Protein: 6.9 g/dL (ref 6.0–8.3)

## 2016-05-10 LAB — LIPID PANEL
CHOLESTEROL: 154 mg/dL (ref 0–200)
HDL: 41.4 mg/dL (ref >39.00)
LDL Cholesterol: 85 mg/dL (ref 0–99)
NonHDL: 113.02
TRIGLYCERIDES: 142 mg/dL (ref 0.0–149.0)
Total CHOL/HDL Ratio: 4
VLDL: 28.4 mg/dL (ref 0.0–40.0)

## 2016-05-10 LAB — MICROALBUMIN / CREATININE URINE RATIO
CREATININE, U: 409 mg/dL
MICROALB UR: 2.9 mg/dL — AB (ref 0.0–1.9)
Microalb Creat Ratio: 0.7 mg/g (ref 0.0–30.0)

## 2016-05-10 LAB — HEMOGLOBIN A1C: HEMOGLOBIN A1C: 7.2 % — AB (ref 4.6–6.5)

## 2016-05-10 NOTE — Assessment & Plan Note (Signed)
Will check A1c with labs. Continue Metformin. 

## 2016-05-10 NOTE — Assessment & Plan Note (Signed)
BP Readings from Last 3 Encounters:  05/10/16 128/82  12/07/15 118/80  11/09/15 120/78   BP well controlled. Renal function with labs. Continue current medication.

## 2016-05-10 NOTE — Assessment & Plan Note (Signed)
Encouraged him to wear different shoes and consider podiatry evaluation. He prefers to hold off for now.

## 2016-05-10 NOTE — Assessment & Plan Note (Addendum)
Intermittent intention tremor right hand. No tremor noted today on exam. Discussed potential causes. Discussed potential treatment and referral to neurology, Dr. Carles Collet. He prefers to hold off for now.

## 2016-05-10 NOTE — Progress Notes (Signed)
Pre visit review using our clinic review tool, if applicable. No additional management support is needed unless otherwise documented below in the visit note. 

## 2016-05-10 NOTE — Assessment & Plan Note (Signed)
Will check lipids with labs. Continue Rosuvastatin 

## 2016-05-10 NOTE — Patient Instructions (Signed)
Labs today.  Follow up in 6 months. 

## 2016-05-10 NOTE — Progress Notes (Signed)
Subjective:    [name], male    DOB: 1946-07-29, 70 y.o.   MRN: [id number]  HPI  70YO male presents for follow up.  Some joint stiffness noted in feet. No pain with this and not taking any medication for it. Notes that two areas of skin on toes rubbed and irritated after recent trip to beach.  Also noted some tremor noted in right hand. Mother had similar symptoms. Most prominent when holding coffee cup. Not taking anything for this.  DM - Does not generally check BG. Compliant with medication.  Wt Readings from Last 3 Encounters:  05/10/16 244 lb (110.678 kg)  12/07/15 241 lb 2 oz (109.374 kg)  11/09/15 240 lb 12.8 oz (109.226 kg)   BP Readings from Last 3 Encounters:  05/10/16 128/82  12/07/15 118/80  11/09/15 120/78    Past Medical History  Diagnosis Date  . Hypertension   . Hyperlipidemia   . Diabetes mellitus   . Abnormal colonoscopy     polyps noted, benign, [facility], rec 10 year follow up  . GERD (gastroesophageal reflux disease)   . Iron deficiency   . Vitamin D deficiency   . Gallstones   . H/O hematuria   . Sleep apnea   . Diverticulosis   . Hemorrhoids   . Alcoholism ([place])     11 yrs ago   . BPH (benign prostatic hyperplasia)    Family History  Problem Relation Age of Onset  . Breast cancer Mother   . Heart disease Father     Heart Attack  . Colon cancer Maternal Grandfather   . Diabetes Paternal Grandfather   . Heart disease Paternal Grandfather   . Coronary artery disease Father   . Nephrolithiasis Daughter    Past Surgical History  Procedure Laterality Date  . Soft palate reduction for sleep apnea      [place]  . Vasectomy    . Tonsillectomy    . Cardiovascular stress test  02/12/2009    Negative/AT [facility] Dr Cherre Robins  . Cystoscopy  07/03/2012  . Rhinoplasty      soft palate reduction for apnea  . Colonoscopy    . Upper gastrointestinal endoscopy     Social History   Social History  . Marital Status:  Married    Spouse Name: N/A  . Number of Children: 2  . Years of Education: N/A   Occupational History  . VP of [employer]    Social History Main Topics  . Smoking status: Never Smoker   . Smokeless tobacco: Never Used  . Alcohol Use: No     Comment: quit 2002  . Drug Use: No  . Sexual Activity: Not Currently   Other Topics Concern  . None   Social History Narrative   Lives in [place], [place] with wife.    2 daughters   No pets. Hobbies: fishing.   Textile management, [employer]   Regular Exercise -  NO   Daily Caffeine Use:  2 -4 coffee    No current alcohol history of alcoholism   No tobacco    Review of Systems  Constitutional: Negative for fever, chills, activity change, appetite change, fatigue and unexpected weight change.  Eyes: Negative for visual disturbance.  Respiratory: Negative for cough and shortness of breath.   Cardiovascular: Negative for chest pain, palpitations and leg swelling.  Gastrointestinal: Negative for nausea, vomiting, abdominal pain, diarrhea, constipation and abdominal distention.  Genitourinary: Negative for dysuria, urgency  and difficulty urinating.  Musculoskeletal: Negative for arthralgias and gait problem.  Skin: Positive for color change. Negative for rash.  Neurological: Positive for tremors. Negative for weakness.  Hematological: Negative for adenopathy.  Psychiatric/Behavioral: Negative for suicidal ideas, sleep disturbance and dysphoric mood. The patient is not nervous/anxious.        Objective:    BP 128/82 mmHg  Pulse 73  Ht 6' 1.25" (1.861 m)  Wt 244 lb (110.678 kg)  BMI 31.96 kg/m2  SpO2 96% Physical Exam  Constitutional: He is oriented to person, place, and time. He appears well-developed and well-nourished. No distress.  HENT:  Head: Normocephalic and atraumatic.  Right Ear: External ear normal.  Left Ear: External ear normal.  Nose: Nose normal.  Mouth/Throat: Oropharynx is clear and moist. No oropharyngeal exudate.    Eyes: Conjunctivae and EOM are normal. Pupils are equal, round, and reactive to light. Right eye exhibits no discharge. Left eye exhibits no discharge. No scleral icterus.  Neck: Normal range of motion. Neck supple. No tracheal deviation present. No thyromegaly present.  Cardiovascular: Normal rate, regular rhythm and normal heart sounds.  Exam reveals no gallop and no friction rub.   No murmur heard. Pulmonary/Chest: Effort normal and breath sounds normal. No accessory muscle usage. No tachypnea. No respiratory distress. He has no decreased breath sounds. He has no wheezes. He has no rhonchi. He has no rales. He exhibits no tenderness.  Musculoskeletal: Normal range of motion. He exhibits no edema.  Lymphadenopathy:    He has no cervical adenopathy.  Neurological: He is alert and oriented to person, place, and time. No cranial nerve deficit. Coordination normal.  Skin: Skin is warm and dry. No rash noted. He is not diaphoretic. No erythema. No pallor.     Psychiatric: He has a normal mood and affect. His behavior is normal. Judgment and thought content normal.          Assessment & Plan:   Problem List Items Addressed This Visit      Unprioritized   Diabetes mellitus type 2, controlled ([place]) - Primary    Will check A1c with labs. Continue Metformin.      Relevant Orders   Comprehensive metabolic panel   Hemoglobin A1c   Lipid panel   Microalbumin / creatinine urine ratio   Hyperlipidemia    Will check lipids with labs. Continue Rosuvastatin.      Hypertension    BP Readings from Last 3 Encounters:  05/10/16 128/82  12/07/15 118/80  11/09/15 120/78   BP well controlled. Renal function with labs. Continue current medication.      Pre-ulcerative corn or callous    Encouraged him to wear different shoes and consider podiatry evaluation. He prefers to hold off for now.      Tremor    Intermittent intention tremor right hand. No tremor noted today on exam. Discussed  potential causes. Discussed potential treatment and referral to neurology, Dr. Carles Collet. He prefers to hold off for now.          Return in about 6 months (around 11/10/2016) for Recheck of Diabetes.  Ronette Deter, MD Internal Medicine [facility]

## 2016-05-19 ENCOUNTER — Other Ambulatory Visit: Payer: Self-pay | Admitting: Internal Medicine

## 2016-05-19 NOTE — Telephone Encounter (Signed)
Refill request for Ambien, Effexor, Losartan/HCTZ, last seen [id number], last filled [id number].  Please advise.

## 2016-06-23 DIAGNOSIS — E669 Obesity, unspecified: Secondary | ICD-10-CM | POA: Diagnosis not present

## 2016-06-23 DIAGNOSIS — E785 Hyperlipidemia, unspecified: Secondary | ICD-10-CM | POA: Diagnosis not present

## 2016-06-23 DIAGNOSIS — E119 Type 2 diabetes mellitus without complications: Secondary | ICD-10-CM | POA: Diagnosis not present

## 2016-06-23 DIAGNOSIS — I10 Essential (primary) hypertension: Secondary | ICD-10-CM | POA: Diagnosis not present

## 2016-07-01 DIAGNOSIS — M25552 Pain in left hip: Secondary | ICD-10-CM | POA: Diagnosis not present

## 2016-07-01 DIAGNOSIS — M7062 Trochanteric bursitis, left hip: Secondary | ICD-10-CM | POA: Diagnosis not present

## 2016-08-18 DIAGNOSIS — I10 Essential (primary) hypertension: Secondary | ICD-10-CM | POA: Diagnosis not present

## 2016-08-18 DIAGNOSIS — E119 Type 2 diabetes mellitus without complications: Secondary | ICD-10-CM | POA: Diagnosis not present

## 2016-08-18 DIAGNOSIS — M25552 Pain in left hip: Secondary | ICD-10-CM | POA: Diagnosis not present

## 2016-08-18 DIAGNOSIS — M545 Low back pain: Secondary | ICD-10-CM | POA: Diagnosis not present

## 2016-08-26 ENCOUNTER — Other Ambulatory Visit: Payer: Self-pay

## 2016-08-26 MED ORDER — METFORMIN HCL 500 MG PO TABS: 500.0000 mg | ORAL_TABLET | Freq: Two times a day (BID) | ORAL | 2 refills | Status: DC

## 2016-09-27 DIAGNOSIS — Z23 Encounter for immunization: Secondary | ICD-10-CM | POA: Diagnosis not present

## 2016-10-31 ENCOUNTER — Other Ambulatory Visit: Payer: Self-pay

## 2016-10-31 MED ORDER — ZOLPIDEM TARTRATE 10 MG PO TABS: 10.0000 mg | ORAL_TABLET | Freq: Every evening | ORAL | 1 refills | Status: DC | PRN

## 2016-10-31 NOTE — Telephone Encounter (Signed)
Please advise refill, last seen in May by Dr. Gilford Rile and no scheduled follow up.  Last refill was 05/19/2016

## 2016-11-01 NOTE — Telephone Encounter (Signed)
faxed

## 2016-11-09 ENCOUNTER — Ambulatory Visit: Payer: Medicare Other

## 2016-11-11 ENCOUNTER — Ambulatory Visit: Payer: Medicare Other | Admitting: Internal Medicine

## 2016-11-24 DIAGNOSIS — D229 Melanocytic nevi, unspecified: Secondary | ICD-10-CM | POA: Diagnosis not present

## 2016-11-24 DIAGNOSIS — L821 Other seborrheic keratosis: Secondary | ICD-10-CM | POA: Diagnosis not present

## 2016-11-24 DIAGNOSIS — L570 Actinic keratosis: Secondary | ICD-10-CM | POA: Diagnosis not present

## 2016-11-24 DIAGNOSIS — E669 Obesity, unspecified: Secondary | ICD-10-CM | POA: Diagnosis not present

## 2016-11-24 DIAGNOSIS — E119 Type 2 diabetes mellitus without complications: Secondary | ICD-10-CM | POA: Diagnosis not present

## 2016-11-24 DIAGNOSIS — I10 Essential (primary) hypertension: Secondary | ICD-10-CM | POA: Diagnosis not present

## 2016-11-24 DIAGNOSIS — Z1283 Encounter for screening for malignant neoplasm of skin: Secondary | ICD-10-CM | POA: Diagnosis not present

## 2016-11-24 DIAGNOSIS — G250 Essential tremor: Secondary | ICD-10-CM | POA: Diagnosis not present

## 2016-12-07 DIAGNOSIS — H40033 Anatomical narrow angle, bilateral: Secondary | ICD-10-CM | POA: Diagnosis not present

## 2016-12-07 DIAGNOSIS — H40013 Open angle with borderline findings, low risk, bilateral: Secondary | ICD-10-CM | POA: Diagnosis not present

## 2016-12-07 DIAGNOSIS — H25013 Cortical age-related cataract, bilateral: Secondary | ICD-10-CM | POA: Diagnosis not present

## 2016-12-07 DIAGNOSIS — H35033 Hypertensive retinopathy, bilateral: Secondary | ICD-10-CM | POA: Diagnosis not present

## 2016-12-07 DIAGNOSIS — H2513 Age-related nuclear cataract, bilateral: Secondary | ICD-10-CM | POA: Diagnosis not present

## 2016-12-23 ENCOUNTER — Other Ambulatory Visit: Payer: Self-pay | Admitting: Family Medicine

## 2016-12-23 NOTE — Telephone Encounter (Signed)
Last filled 11/28/16. Last OV on 05/10/16 with Dr. Gilford Rile. No scheduled visit to establish care with another provider.

## 2016-12-23 NOTE — Telephone Encounter (Signed)
RX faxed

## 2017-01-30 ENCOUNTER — Other Ambulatory Visit: Payer: Self-pay | Admitting: Family Medicine

## 2017-01-30 NOTE — Telephone Encounter (Signed)
Refilled 12/23/2016. Last office visit 05/10/2016. No appt scheduled.

## 2017-01-31 NOTE — Telephone Encounter (Signed)
faxed

## 2017-02-03 DIAGNOSIS — E119 Type 2 diabetes mellitus without complications: Secondary | ICD-10-CM | POA: Diagnosis not present

## 2017-02-03 DIAGNOSIS — E785 Hyperlipidemia, unspecified: Secondary | ICD-10-CM | POA: Diagnosis not present

## 2017-02-03 DIAGNOSIS — Z125 Encounter for screening for malignant neoplasm of prostate: Secondary | ICD-10-CM | POA: Diagnosis not present

## 2017-02-03 DIAGNOSIS — I2510 Atherosclerotic heart disease of native coronary artery without angina pectoris: Secondary | ICD-10-CM | POA: Diagnosis not present

## 2017-02-03 DIAGNOSIS — I10 Essential (primary) hypertension: Secondary | ICD-10-CM | POA: Diagnosis not present

## 2017-02-03 DIAGNOSIS — R0602 Shortness of breath: Secondary | ICD-10-CM | POA: Diagnosis not present

## 2017-02-03 DIAGNOSIS — R972 Elevated prostate specific antigen [PSA]: Secondary | ICD-10-CM | POA: Diagnosis not present

## 2017-02-23 DIAGNOSIS — E119 Type 2 diabetes mellitus without complications: Secondary | ICD-10-CM | POA: Diagnosis not present

## 2017-02-23 DIAGNOSIS — E669 Obesity, unspecified: Secondary | ICD-10-CM | POA: Diagnosis not present

## 2017-02-23 DIAGNOSIS — N400 Enlarged prostate without lower urinary tract symptoms: Secondary | ICD-10-CM | POA: Diagnosis not present

## 2017-02-23 DIAGNOSIS — E785 Hyperlipidemia, unspecified: Secondary | ICD-10-CM | POA: Diagnosis not present

## 2017-02-23 DIAGNOSIS — I10 Essential (primary) hypertension: Secondary | ICD-10-CM | POA: Diagnosis not present

## 2017-03-29 DIAGNOSIS — H40033 Anatomical narrow angle, bilateral: Secondary | ICD-10-CM | POA: Diagnosis not present

## 2017-03-29 DIAGNOSIS — H40013 Open angle with borderline findings, low risk, bilateral: Secondary | ICD-10-CM | POA: Diagnosis not present

## 2017-05-30 ENCOUNTER — Other Ambulatory Visit: Payer: Self-pay | Admitting: Family Medicine

## 2017-05-30 NOTE — Telephone Encounter (Signed)
Refilled: 08/26/2016 Last OV: 05/10/2016 with Dr. Gilford Rile Next OV: not scheduled Last Labs: 05/10/2016

## 2017-06-29 DIAGNOSIS — I10 Essential (primary) hypertension: Secondary | ICD-10-CM | POA: Diagnosis not present

## 2017-06-29 DIAGNOSIS — Z1329 Encounter for screening for other suspected endocrine disorder: Secondary | ICD-10-CM | POA: Diagnosis not present

## 2017-06-29 DIAGNOSIS — E669 Obesity, unspecified: Secondary | ICD-10-CM | POA: Diagnosis not present

## 2017-06-29 DIAGNOSIS — E119 Type 2 diabetes mellitus without complications: Secondary | ICD-10-CM | POA: Diagnosis not present

## 2017-06-29 DIAGNOSIS — Z113 Encounter for screening for infections with a predominantly sexual mode of transmission: Secondary | ICD-10-CM | POA: Diagnosis not present

## 2017-07-03 DIAGNOSIS — R319 Hematuria, unspecified: Secondary | ICD-10-CM | POA: Diagnosis not present

## 2017-07-06 DIAGNOSIS — Z1212 Encounter for screening for malignant neoplasm of rectum: Secondary | ICD-10-CM | POA: Diagnosis not present

## 2017-07-06 DIAGNOSIS — Z1211 Encounter for screening for malignant neoplasm of colon: Secondary | ICD-10-CM | POA: Diagnosis not present

## 2017-07-31 DIAGNOSIS — R319 Hematuria, unspecified: Secondary | ICD-10-CM | POA: Diagnosis not present

## 2017-08-03 DIAGNOSIS — I340 Nonrheumatic mitral (valve) insufficiency: Secondary | ICD-10-CM | POA: Diagnosis not present

## 2017-08-03 DIAGNOSIS — E785 Hyperlipidemia, unspecified: Secondary | ICD-10-CM | POA: Diagnosis not present

## 2017-08-03 DIAGNOSIS — I2510 Atherosclerotic heart disease of native coronary artery without angina pectoris: Secondary | ICD-10-CM | POA: Diagnosis not present

## 2017-08-03 DIAGNOSIS — R0602 Shortness of breath: Secondary | ICD-10-CM | POA: Diagnosis not present

## 2017-08-03 DIAGNOSIS — I10 Essential (primary) hypertension: Secondary | ICD-10-CM | POA: Diagnosis not present

## 2017-09-27 DIAGNOSIS — Z23 Encounter for immunization: Secondary | ICD-10-CM | POA: Diagnosis not present

## 2017-10-09 DIAGNOSIS — H25013 Cortical age-related cataract, bilateral: Secondary | ICD-10-CM | POA: Diagnosis not present

## 2017-10-09 DIAGNOSIS — D3131 Benign neoplasm of right choroid: Secondary | ICD-10-CM | POA: Diagnosis not present

## 2017-10-09 DIAGNOSIS — H35033 Hypertensive retinopathy, bilateral: Secondary | ICD-10-CM | POA: Diagnosis not present

## 2017-10-09 DIAGNOSIS — E119 Type 2 diabetes mellitus without complications: Secondary | ICD-10-CM | POA: Diagnosis not present

## 2017-10-09 DIAGNOSIS — H2511 Age-related nuclear cataract, right eye: Secondary | ICD-10-CM | POA: Diagnosis not present

## 2017-10-09 DIAGNOSIS — H2513 Age-related nuclear cataract, bilateral: Secondary | ICD-10-CM | POA: Diagnosis not present

## 2017-10-26 DIAGNOSIS — I10 Essential (primary) hypertension: Secondary | ICD-10-CM | POA: Diagnosis not present

## 2017-10-26 DIAGNOSIS — E119 Type 2 diabetes mellitus without complications: Secondary | ICD-10-CM | POA: Diagnosis not present

## 2017-10-26 DIAGNOSIS — G250 Essential tremor: Secondary | ICD-10-CM | POA: Diagnosis not present

## 2017-10-26 DIAGNOSIS — E785 Hyperlipidemia, unspecified: Secondary | ICD-10-CM | POA: Diagnosis not present

## 2017-11-16 DIAGNOSIS — I10 Essential (primary) hypertension: Secondary | ICD-10-CM | POA: Diagnosis not present

## 2017-11-16 DIAGNOSIS — E785 Hyperlipidemia, unspecified: Secondary | ICD-10-CM | POA: Diagnosis not present

## 2017-11-16 DIAGNOSIS — G250 Essential tremor: Secondary | ICD-10-CM | POA: Diagnosis not present

## 2017-11-16 DIAGNOSIS — I498 Other specified cardiac arrhythmias: Secondary | ICD-10-CM | POA: Diagnosis not present

## 2017-11-16 DIAGNOSIS — E119 Type 2 diabetes mellitus without complications: Secondary | ICD-10-CM | POA: Diagnosis not present

## 2017-11-17 DIAGNOSIS — I10 Essential (primary) hypertension: Secondary | ICD-10-CM | POA: Diagnosis not present

## 2017-11-17 DIAGNOSIS — R55 Syncope and collapse: Secondary | ICD-10-CM | POA: Diagnosis not present

## 2017-11-17 DIAGNOSIS — I2510 Atherosclerotic heart disease of native coronary artery without angina pectoris: Secondary | ICD-10-CM | POA: Diagnosis not present

## 2017-11-17 DIAGNOSIS — E785 Hyperlipidemia, unspecified: Secondary | ICD-10-CM | POA: Diagnosis not present

## 2017-11-17 DIAGNOSIS — R9431 Abnormal electrocardiogram [ECG] [EKG]: Secondary | ICD-10-CM | POA: Diagnosis not present

## 2017-11-17 DIAGNOSIS — R0602 Shortness of breath: Secondary | ICD-10-CM | POA: Diagnosis not present

## 2017-11-20 ENCOUNTER — Other Ambulatory Visit: Payer: Self-pay | Admitting: Family Medicine

## 2017-11-21 DIAGNOSIS — H2511 Age-related nuclear cataract, right eye: Secondary | ICD-10-CM | POA: Diagnosis not present

## 2017-11-21 DIAGNOSIS — H25811 Combined forms of age-related cataract, right eye: Secondary | ICD-10-CM | POA: Diagnosis not present

## 2017-11-28 DIAGNOSIS — B078 Other viral warts: Secondary | ICD-10-CM | POA: Diagnosis not present

## 2017-11-28 DIAGNOSIS — L821 Other seborrheic keratosis: Secondary | ICD-10-CM | POA: Diagnosis not present

## 2017-11-28 DIAGNOSIS — L570 Actinic keratosis: Secondary | ICD-10-CM | POA: Diagnosis not present

## 2017-11-28 DIAGNOSIS — D485 Neoplasm of uncertain behavior of skin: Secondary | ICD-10-CM | POA: Diagnosis not present

## 2017-11-28 DIAGNOSIS — Z872 Personal history of diseases of the skin and subcutaneous tissue: Secondary | ICD-10-CM | POA: Diagnosis not present

## 2017-11-28 DIAGNOSIS — L578 Other skin changes due to chronic exposure to nonionizing radiation: Secondary | ICD-10-CM | POA: Diagnosis not present

## 2017-11-28 DIAGNOSIS — L218 Other seborrheic dermatitis: Secondary | ICD-10-CM | POA: Diagnosis not present

## 2017-11-29 DIAGNOSIS — R079 Chest pain, unspecified: Secondary | ICD-10-CM | POA: Diagnosis not present

## 2017-12-01 DIAGNOSIS — I2510 Atherosclerotic heart disease of native coronary artery without angina pectoris: Secondary | ICD-10-CM | POA: Diagnosis not present

## 2017-12-01 DIAGNOSIS — R001 Bradycardia, unspecified: Secondary | ICD-10-CM | POA: Diagnosis not present

## 2017-12-01 DIAGNOSIS — R55 Syncope and collapse: Secondary | ICD-10-CM | POA: Diagnosis not present

## 2017-12-01 DIAGNOSIS — E785 Hyperlipidemia, unspecified: Secondary | ICD-10-CM | POA: Diagnosis not present

## 2017-12-01 DIAGNOSIS — I10 Essential (primary) hypertension: Secondary | ICD-10-CM | POA: Diagnosis not present

## 2017-12-07 DIAGNOSIS — R9430 Abnormal result of cardiovascular function study, unspecified: Secondary | ICD-10-CM | POA: Diagnosis not present

## 2017-12-18 DIAGNOSIS — H25012 Cortical age-related cataract, left eye: Secondary | ICD-10-CM | POA: Diagnosis not present

## 2017-12-18 DIAGNOSIS — H2512 Age-related nuclear cataract, left eye: Secondary | ICD-10-CM | POA: Diagnosis not present

## 2018-05-21 ENCOUNTER — Other Ambulatory Visit: Payer: Self-pay | Admitting: Family Medicine

## 2019-01-11 DIAGNOSIS — H35033 Hypertensive retinopathy, bilateral: Secondary | ICD-10-CM | POA: Diagnosis not present

## 2019-01-11 DIAGNOSIS — I10 Essential (primary) hypertension: Secondary | ICD-10-CM | POA: Diagnosis not present

## 2019-01-11 DIAGNOSIS — D3131 Benign neoplasm of right choroid: Secondary | ICD-10-CM | POA: Diagnosis not present

## 2019-01-11 DIAGNOSIS — H5703 Miosis: Secondary | ICD-10-CM | POA: Diagnosis not present

## 2019-01-11 DIAGNOSIS — H40033 Anatomical narrow angle, bilateral: Secondary | ICD-10-CM | POA: Diagnosis not present

## 2019-01-11 DIAGNOSIS — H40013 Open angle with borderline findings, low risk, bilateral: Secondary | ICD-10-CM | POA: Diagnosis not present

## 2019-02-14 DIAGNOSIS — R0602 Shortness of breath: Secondary | ICD-10-CM | POA: Diagnosis not present

## 2019-02-14 DIAGNOSIS — R55 Syncope and collapse: Secondary | ICD-10-CM | POA: Diagnosis not present

## 2019-02-14 DIAGNOSIS — I2510 Atherosclerotic heart disease of native coronary artery without angina pectoris: Secondary | ICD-10-CM | POA: Diagnosis not present

## 2019-02-14 DIAGNOSIS — I10 Essential (primary) hypertension: Secondary | ICD-10-CM | POA: Diagnosis not present

## 2019-02-14 DIAGNOSIS — E785 Hyperlipidemia, unspecified: Secondary | ICD-10-CM | POA: Diagnosis not present

## 2019-02-19 DIAGNOSIS — R0602 Shortness of breath: Secondary | ICD-10-CM | POA: Diagnosis not present

## 2019-02-19 DIAGNOSIS — I10 Essential (primary) hypertension: Secondary | ICD-10-CM | POA: Diagnosis not present

## 2019-02-19 DIAGNOSIS — E785 Hyperlipidemia, unspecified: Secondary | ICD-10-CM | POA: Diagnosis not present

## 2019-02-19 DIAGNOSIS — I2510 Atherosclerotic heart disease of native coronary artery without angina pectoris: Secondary | ICD-10-CM | POA: Diagnosis not present

## 2019-02-20 DIAGNOSIS — R070 Pain in throat: Secondary | ICD-10-CM | POA: Diagnosis not present

## 2019-02-20 DIAGNOSIS — R079 Chest pain, unspecified: Secondary | ICD-10-CM | POA: Diagnosis not present

## 2019-02-26 DIAGNOSIS — I340 Nonrheumatic mitral (valve) insufficiency: Secondary | ICD-10-CM | POA: Diagnosis not present

## 2019-02-26 DIAGNOSIS — E785 Hyperlipidemia, unspecified: Secondary | ICD-10-CM | POA: Diagnosis not present

## 2019-02-26 DIAGNOSIS — R0602 Shortness of breath: Secondary | ICD-10-CM | POA: Diagnosis not present

## 2019-02-26 DIAGNOSIS — R55 Syncope and collapse: Secondary | ICD-10-CM | POA: Diagnosis not present

## 2019-02-26 DIAGNOSIS — I10 Essential (primary) hypertension: Secondary | ICD-10-CM | POA: Diagnosis not present

## 2019-02-26 DIAGNOSIS — I351 Nonrheumatic aortic (valve) insufficiency: Secondary | ICD-10-CM | POA: Diagnosis not present

## 2019-02-26 DIAGNOSIS — I2510 Atherosclerotic heart disease of native coronary artery without angina pectoris: Secondary | ICD-10-CM | POA: Diagnosis not present

## 2019-06-13 DIAGNOSIS — E119 Type 2 diabetes mellitus without complications: Secondary | ICD-10-CM | POA: Diagnosis not present

## 2019-06-13 DIAGNOSIS — E559 Vitamin D deficiency, unspecified: Secondary | ICD-10-CM | POA: Diagnosis not present

## 2019-06-13 DIAGNOSIS — I10 Essential (primary) hypertension: Secondary | ICD-10-CM | POA: Diagnosis not present

## 2019-06-13 DIAGNOSIS — I2510 Atherosclerotic heart disease of native coronary artery without angina pectoris: Secondary | ICD-10-CM | POA: Diagnosis not present

## 2019-06-13 DIAGNOSIS — E785 Hyperlipidemia, unspecified: Secondary | ICD-10-CM | POA: Diagnosis not present

## 2019-08-30 DIAGNOSIS — Z23 Encounter for immunization: Secondary | ICD-10-CM | POA: Diagnosis not present

## 2019-12-17 DIAGNOSIS — G4700 Insomnia, unspecified: Secondary | ICD-10-CM | POA: Diagnosis not present

## 2019-12-17 DIAGNOSIS — I10 Essential (primary) hypertension: Secondary | ICD-10-CM | POA: Diagnosis not present

## 2019-12-17 DIAGNOSIS — I2510 Atherosclerotic heart disease of native coronary artery without angina pectoris: Secondary | ICD-10-CM | POA: Diagnosis not present

## 2019-12-17 DIAGNOSIS — E119 Type 2 diabetes mellitus without complications: Secondary | ICD-10-CM | POA: Diagnosis not present

## 2019-12-17 DIAGNOSIS — E785 Hyperlipidemia, unspecified: Secondary | ICD-10-CM | POA: Diagnosis not present

## 2019-12-17 DIAGNOSIS — E559 Vitamin D deficiency, unspecified: Secondary | ICD-10-CM | POA: Diagnosis not present

## 2020-01-03 DIAGNOSIS — E538 Deficiency of other specified B group vitamins: Secondary | ICD-10-CM | POA: Diagnosis not present

## 2020-01-03 DIAGNOSIS — E559 Vitamin D deficiency, unspecified: Secondary | ICD-10-CM | POA: Diagnosis not present

## 2020-01-03 DIAGNOSIS — E519 Thiamine deficiency, unspecified: Secondary | ICD-10-CM | POA: Diagnosis not present

## 2020-01-03 DIAGNOSIS — R259 Unspecified abnormal involuntary movements: Secondary | ICD-10-CM | POA: Diagnosis not present

## 2020-01-03 DIAGNOSIS — R251 Tremor, unspecified: Secondary | ICD-10-CM | POA: Diagnosis not present

## 2020-01-06 ENCOUNTER — Other Ambulatory Visit: Payer: Self-pay | Admitting: Neurology

## 2020-01-06 DIAGNOSIS — R259 Unspecified abnormal involuntary movements: Secondary | ICD-10-CM

## 2020-01-09 DIAGNOSIS — Z23 Encounter for immunization: Secondary | ICD-10-CM | POA: Diagnosis not present

## 2020-01-12 ENCOUNTER — Ambulatory Visit
Admission: RE | Admit: 2020-01-12 | Discharge: 2020-01-12 | Disposition: A | Discharge status: Home / Self Care | Payer: Medicare Other | Source: Ambulatory Visit | Attending: Neurology | Admitting: Neurology

## 2020-01-12 ENCOUNTER — Other Ambulatory Visit: Payer: Self-pay

## 2020-01-12 DIAGNOSIS — R259 Unspecified abnormal involuntary movements: Secondary | ICD-10-CM | POA: Insufficient documentation

## 2020-01-12 DIAGNOSIS — R519 Headache, unspecified: Secondary | ICD-10-CM | POA: Diagnosis not present

## 2020-01-23 DIAGNOSIS — R29818 Other symptoms and signs involving the nervous system: Secondary | ICD-10-CM | POA: Insufficient documentation

## 2020-01-30 DIAGNOSIS — Z23 Encounter for immunization: Secondary | ICD-10-CM | POA: Diagnosis not present

## 2020-02-20 DIAGNOSIS — H35033 Hypertensive retinopathy, bilateral: Secondary | ICD-10-CM | POA: Diagnosis not present

## 2020-02-20 DIAGNOSIS — Z961 Presence of intraocular lens: Secondary | ICD-10-CM | POA: Diagnosis not present

## 2020-02-20 DIAGNOSIS — H40013 Open angle with borderline findings, low risk, bilateral: Secondary | ICD-10-CM | POA: Diagnosis not present

## 2020-02-20 DIAGNOSIS — H40033 Anatomical narrow angle, bilateral: Secondary | ICD-10-CM | POA: Diagnosis not present

## 2020-02-27 DIAGNOSIS — I10 Essential (primary) hypertension: Secondary | ICD-10-CM | POA: Diagnosis not present

## 2020-02-27 DIAGNOSIS — I340 Nonrheumatic mitral (valve) insufficiency: Secondary | ICD-10-CM | POA: Diagnosis not present

## 2020-02-27 DIAGNOSIS — E785 Hyperlipidemia, unspecified: Secondary | ICD-10-CM | POA: Diagnosis not present

## 2020-02-27 DIAGNOSIS — I2510 Atherosclerotic heart disease of native coronary artery without angina pectoris: Secondary | ICD-10-CM | POA: Diagnosis not present

## 2020-02-27 DIAGNOSIS — I351 Nonrheumatic aortic (valve) insufficiency: Secondary | ICD-10-CM | POA: Diagnosis not present

## 2020-03-13 DIAGNOSIS — E785 Hyperlipidemia, unspecified: Secondary | ICD-10-CM | POA: Diagnosis not present

## 2020-03-13 DIAGNOSIS — I340 Nonrheumatic mitral (valve) insufficiency: Secondary | ICD-10-CM | POA: Diagnosis not present

## 2020-03-13 DIAGNOSIS — R0602 Shortness of breath: Secondary | ICD-10-CM | POA: Diagnosis not present

## 2020-03-13 DIAGNOSIS — I351 Nonrheumatic aortic (valve) insufficiency: Secondary | ICD-10-CM | POA: Diagnosis not present

## 2020-03-13 DIAGNOSIS — R55 Syncope and collapse: Secondary | ICD-10-CM | POA: Diagnosis not present

## 2020-03-13 DIAGNOSIS — I10 Essential (primary) hypertension: Secondary | ICD-10-CM | POA: Diagnosis not present

## 2020-03-13 DIAGNOSIS — I2510 Atherosclerotic heart disease of native coronary artery without angina pectoris: Secondary | ICD-10-CM | POA: Diagnosis not present

## 2020-03-17 DIAGNOSIS — E538 Deficiency of other specified B group vitamins: Secondary | ICD-10-CM | POA: Diagnosis not present

## 2020-03-17 DIAGNOSIS — E559 Vitamin D deficiency, unspecified: Secondary | ICD-10-CM | POA: Diagnosis not present

## 2020-03-17 DIAGNOSIS — I2510 Atherosclerotic heart disease of native coronary artery without angina pectoris: Secondary | ICD-10-CM | POA: Diagnosis not present

## 2020-03-17 DIAGNOSIS — E119 Type 2 diabetes mellitus without complications: Secondary | ICD-10-CM | POA: Diagnosis not present

## 2020-03-17 DIAGNOSIS — R55 Syncope and collapse: Secondary | ICD-10-CM | POA: Diagnosis not present

## 2020-03-17 DIAGNOSIS — G20 Parkinson's disease: Secondary | ICD-10-CM | POA: Diagnosis not present

## 2020-03-17 DIAGNOSIS — R0602 Shortness of breath: Secondary | ICD-10-CM | POA: Diagnosis not present

## 2020-03-17 DIAGNOSIS — I351 Nonrheumatic aortic (valve) insufficiency: Secondary | ICD-10-CM | POA: Diagnosis not present

## 2020-03-17 DIAGNOSIS — I10 Essential (primary) hypertension: Secondary | ICD-10-CM | POA: Diagnosis not present

## 2020-03-17 DIAGNOSIS — I340 Nonrheumatic mitral (valve) insufficiency: Secondary | ICD-10-CM | POA: Diagnosis not present

## 2020-03-17 DIAGNOSIS — E785 Hyperlipidemia, unspecified: Secondary | ICD-10-CM | POA: Diagnosis not present

## 2020-07-01 DIAGNOSIS — E538 Deficiency of other specified B group vitamins: Secondary | ICD-10-CM | POA: Diagnosis not present

## 2020-07-01 DIAGNOSIS — R259 Unspecified abnormal involuntary movements: Secondary | ICD-10-CM | POA: Diagnosis not present

## 2020-09-09 DIAGNOSIS — Z23 Encounter for immunization: Secondary | ICD-10-CM | POA: Diagnosis not present

## 2020-09-14 DIAGNOSIS — Z23 Encounter for immunization: Secondary | ICD-10-CM | POA: Diagnosis not present

## 2020-09-17 DIAGNOSIS — I2510 Atherosclerotic heart disease of native coronary artery without angina pectoris: Secondary | ICD-10-CM | POA: Diagnosis not present

## 2020-09-17 DIAGNOSIS — I351 Nonrheumatic aortic (valve) insufficiency: Secondary | ICD-10-CM | POA: Diagnosis not present

## 2020-09-17 DIAGNOSIS — N400 Enlarged prostate without lower urinary tract symptoms: Secondary | ICD-10-CM | POA: Diagnosis not present

## 2020-09-17 DIAGNOSIS — I340 Nonrheumatic mitral (valve) insufficiency: Secondary | ICD-10-CM | POA: Diagnosis not present

## 2020-09-17 DIAGNOSIS — G20 Parkinson's disease: Secondary | ICD-10-CM | POA: Diagnosis not present

## 2020-09-17 DIAGNOSIS — E538 Deficiency of other specified B group vitamins: Secondary | ICD-10-CM | POA: Diagnosis not present

## 2020-09-17 DIAGNOSIS — E119 Type 2 diabetes mellitus without complications: Secondary | ICD-10-CM | POA: Diagnosis not present

## 2020-09-17 DIAGNOSIS — I10 Essential (primary) hypertension: Secondary | ICD-10-CM | POA: Diagnosis not present

## 2020-09-17 DIAGNOSIS — F411 Generalized anxiety disorder: Secondary | ICD-10-CM | POA: Diagnosis not present

## 2020-09-17 DIAGNOSIS — E785 Hyperlipidemia, unspecified: Secondary | ICD-10-CM | POA: Diagnosis not present

## 2020-11-03 DIAGNOSIS — G20 Parkinson's disease: Secondary | ICD-10-CM | POA: Diagnosis not present

## 2020-11-03 DIAGNOSIS — E538 Deficiency of other specified B group vitamins: Secondary | ICD-10-CM | POA: Diagnosis not present

## 2020-11-10 DIAGNOSIS — Z20822 Contact with and (suspected) exposure to covid-19: Secondary | ICD-10-CM | POA: Diagnosis not present

## 2021-03-05 DIAGNOSIS — H02886 Meibomian gland dysfunction of left eye, unspecified eyelid: Secondary | ICD-10-CM | POA: Diagnosis not present

## 2021-03-05 DIAGNOSIS — Z961 Presence of intraocular lens: Secondary | ICD-10-CM | POA: Diagnosis not present

## 2021-03-05 DIAGNOSIS — H02883 Meibomian gland dysfunction of right eye, unspecified eyelid: Secondary | ICD-10-CM | POA: Diagnosis not present

## 2021-03-10 DIAGNOSIS — E785 Hyperlipidemia, unspecified: Secondary | ICD-10-CM | POA: Diagnosis not present

## 2021-03-10 DIAGNOSIS — I2510 Atherosclerotic heart disease of native coronary artery without angina pectoris: Secondary | ICD-10-CM | POA: Diagnosis not present

## 2021-03-15 DIAGNOSIS — I351 Nonrheumatic aortic (valve) insufficiency: Secondary | ICD-10-CM | POA: Diagnosis not present

## 2021-03-15 DIAGNOSIS — E538 Deficiency of other specified B group vitamins: Secondary | ICD-10-CM | POA: Diagnosis not present

## 2021-03-15 DIAGNOSIS — G20 Parkinson's disease: Secondary | ICD-10-CM | POA: Diagnosis not present

## 2021-03-15 DIAGNOSIS — R0602 Shortness of breath: Secondary | ICD-10-CM | POA: Diagnosis not present

## 2021-03-15 DIAGNOSIS — I340 Nonrheumatic mitral (valve) insufficiency: Secondary | ICD-10-CM | POA: Diagnosis not present

## 2021-03-15 DIAGNOSIS — I2510 Atherosclerotic heart disease of native coronary artery without angina pectoris: Secondary | ICD-10-CM | POA: Diagnosis not present

## 2021-03-15 DIAGNOSIS — E559 Vitamin D deficiency, unspecified: Secondary | ICD-10-CM | POA: Diagnosis not present

## 2021-03-15 DIAGNOSIS — E119 Type 2 diabetes mellitus without complications: Secondary | ICD-10-CM | POA: Diagnosis not present

## 2021-03-15 DIAGNOSIS — E785 Hyperlipidemia, unspecified: Secondary | ICD-10-CM | POA: Diagnosis not present

## 2021-03-15 DIAGNOSIS — I10 Essential (primary) hypertension: Secondary | ICD-10-CM | POA: Diagnosis not present

## 2021-09-08 DIAGNOSIS — Z23 Encounter for immunization: Secondary | ICD-10-CM | POA: Diagnosis not present

## 2021-09-20 DIAGNOSIS — G20 Parkinson's disease: Secondary | ICD-10-CM | POA: Diagnosis not present

## 2021-09-21 DIAGNOSIS — E119 Type 2 diabetes mellitus without complications: Secondary | ICD-10-CM | POA: Diagnosis not present

## 2021-09-21 DIAGNOSIS — I10 Essential (primary) hypertension: Secondary | ICD-10-CM | POA: Diagnosis not present

## 2021-09-21 DIAGNOSIS — E785 Hyperlipidemia, unspecified: Secondary | ICD-10-CM | POA: Diagnosis not present

## 2021-09-21 DIAGNOSIS — Z125 Encounter for screening for malignant neoplasm of prostate: Secondary | ICD-10-CM | POA: Diagnosis not present

## 2021-09-21 DIAGNOSIS — E538 Deficiency of other specified B group vitamins: Secondary | ICD-10-CM | POA: Diagnosis not present

## 2021-09-22 DIAGNOSIS — I351 Nonrheumatic aortic (valve) insufficiency: Secondary | ICD-10-CM | POA: Diagnosis not present

## 2021-09-22 DIAGNOSIS — I10 Essential (primary) hypertension: Secondary | ICD-10-CM | POA: Diagnosis not present

## 2021-09-22 DIAGNOSIS — N401 Enlarged prostate with lower urinary tract symptoms: Secondary | ICD-10-CM | POA: Diagnosis not present

## 2021-09-22 DIAGNOSIS — G20 Parkinson's disease: Secondary | ICD-10-CM | POA: Diagnosis not present

## 2021-09-22 DIAGNOSIS — I2510 Atherosclerotic heart disease of native coronary artery without angina pectoris: Secondary | ICD-10-CM | POA: Diagnosis not present

## 2021-09-22 DIAGNOSIS — I340 Nonrheumatic mitral (valve) insufficiency: Secondary | ICD-10-CM | POA: Diagnosis not present

## 2021-09-22 DIAGNOSIS — E785 Hyperlipidemia, unspecified: Secondary | ICD-10-CM | POA: Diagnosis not present

## 2021-09-22 DIAGNOSIS — E119 Type 2 diabetes mellitus without complications: Secondary | ICD-10-CM | POA: Diagnosis not present

## 2021-11-15 DIAGNOSIS — E785 Hyperlipidemia, unspecified: Secondary | ICD-10-CM | POA: Diagnosis not present

## 2021-11-15 DIAGNOSIS — R079 Chest pain, unspecified: Secondary | ICD-10-CM | POA: Diagnosis not present

## 2021-11-15 DIAGNOSIS — I10 Essential (primary) hypertension: Secondary | ICD-10-CM | POA: Diagnosis not present

## 2021-11-15 DIAGNOSIS — R0602 Shortness of breath: Secondary | ICD-10-CM | POA: Diagnosis not present

## 2021-11-15 DIAGNOSIS — I2510 Atherosclerotic heart disease of native coronary artery without angina pectoris: Secondary | ICD-10-CM | POA: Diagnosis not present

## 2021-11-16 DIAGNOSIS — I10 Essential (primary) hypertension: Secondary | ICD-10-CM | POA: Diagnosis not present

## 2021-11-16 DIAGNOSIS — G20 Parkinson's disease: Secondary | ICD-10-CM | POA: Diagnosis not present

## 2021-11-16 DIAGNOSIS — E785 Hyperlipidemia, unspecified: Secondary | ICD-10-CM | POA: Diagnosis not present

## 2021-11-16 DIAGNOSIS — E119 Type 2 diabetes mellitus without complications: Secondary | ICD-10-CM | POA: Diagnosis not present

## 2021-11-16 DIAGNOSIS — I340 Nonrheumatic mitral (valve) insufficiency: Secondary | ICD-10-CM | POA: Diagnosis not present

## 2021-11-16 DIAGNOSIS — I2510 Atherosclerotic heart disease of native coronary artery without angina pectoris: Secondary | ICD-10-CM | POA: Diagnosis not present

## 2021-11-16 DIAGNOSIS — I351 Nonrheumatic aortic (valve) insufficiency: Secondary | ICD-10-CM | POA: Diagnosis not present

## 2021-11-16 DIAGNOSIS — N401 Enlarged prostate with lower urinary tract symptoms: Secondary | ICD-10-CM | POA: Diagnosis not present

## 2021-12-16 DIAGNOSIS — R079 Chest pain, unspecified: Secondary | ICD-10-CM | POA: Diagnosis not present

## 2021-12-28 DIAGNOSIS — N401 Enlarged prostate with lower urinary tract symptoms: Secondary | ICD-10-CM | POA: Diagnosis not present

## 2021-12-28 DIAGNOSIS — E119 Type 2 diabetes mellitus without complications: Secondary | ICD-10-CM | POA: Diagnosis not present

## 2021-12-29 DIAGNOSIS — G20 Parkinson's disease: Secondary | ICD-10-CM | POA: Diagnosis not present

## 2021-12-29 DIAGNOSIS — N401 Enlarged prostate with lower urinary tract symptoms: Secondary | ICD-10-CM | POA: Diagnosis not present

## 2021-12-29 DIAGNOSIS — I351 Nonrheumatic aortic (valve) insufficiency: Secondary | ICD-10-CM | POA: Diagnosis not present

## 2021-12-29 DIAGNOSIS — I10 Essential (primary) hypertension: Secondary | ICD-10-CM | POA: Diagnosis not present

## 2021-12-29 DIAGNOSIS — I340 Nonrheumatic mitral (valve) insufficiency: Secondary | ICD-10-CM | POA: Diagnosis not present

## 2021-12-29 DIAGNOSIS — E1169 Type 2 diabetes mellitus with other specified complication: Secondary | ICD-10-CM | POA: Diagnosis not present

## 2021-12-29 DIAGNOSIS — E1140 Type 2 diabetes mellitus with diabetic neuropathy, unspecified: Secondary | ICD-10-CM | POA: Diagnosis not present

## 2021-12-29 DIAGNOSIS — E785 Hyperlipidemia, unspecified: Secondary | ICD-10-CM | POA: Diagnosis not present

## 2021-12-29 DIAGNOSIS — I2510 Atherosclerotic heart disease of native coronary artery without angina pectoris: Secondary | ICD-10-CM | POA: Diagnosis not present

## 2022-03-08 DIAGNOSIS — H0288A Meibomian gland dysfunction right eye, upper and lower eyelids: Secondary | ICD-10-CM | POA: Diagnosis not present

## 2022-03-08 DIAGNOSIS — H0288B Meibomian gland dysfunction left eye, upper and lower eyelids: Secondary | ICD-10-CM | POA: Diagnosis not present

## 2022-03-08 DIAGNOSIS — H43393 Other vitreous opacities, bilateral: Secondary | ICD-10-CM | POA: Diagnosis not present

## 2022-03-08 DIAGNOSIS — Z961 Presence of intraocular lens: Secondary | ICD-10-CM | POA: Diagnosis not present

## 2022-03-21 DIAGNOSIS — G20 Parkinson's disease: Secondary | ICD-10-CM | POA: Diagnosis not present

## 2022-03-21 DIAGNOSIS — F5101 Primary insomnia: Secondary | ICD-10-CM | POA: Diagnosis not present

## 2022-03-21 DIAGNOSIS — R259 Unspecified abnormal involuntary movements: Secondary | ICD-10-CM | POA: Diagnosis not present

## 2022-03-21 DIAGNOSIS — R42 Dizziness and giddiness: Secondary | ICD-10-CM | POA: Diagnosis not present

## 2022-03-21 DIAGNOSIS — G250 Essential tremor: Secondary | ICD-10-CM | POA: Diagnosis not present

## 2022-03-21 DIAGNOSIS — R7309 Other abnormal glucose: Secondary | ICD-10-CM | POA: Diagnosis not present

## 2022-05-02 DIAGNOSIS — I2510 Atherosclerotic heart disease of native coronary artery without angina pectoris: Secondary | ICD-10-CM | POA: Diagnosis not present

## 2022-05-02 DIAGNOSIS — N401 Enlarged prostate with lower urinary tract symptoms: Secondary | ICD-10-CM | POA: Diagnosis not present

## 2022-05-02 DIAGNOSIS — G20 Parkinson's disease: Secondary | ICD-10-CM | POA: Diagnosis not present

## 2022-05-02 DIAGNOSIS — I351 Nonrheumatic aortic (valve) insufficiency: Secondary | ICD-10-CM | POA: Diagnosis not present

## 2022-05-02 DIAGNOSIS — R079 Chest pain, unspecified: Secondary | ICD-10-CM | POA: Diagnosis not present

## 2022-05-02 DIAGNOSIS — E1140 Type 2 diabetes mellitus with diabetic neuropathy, unspecified: Secondary | ICD-10-CM | POA: Diagnosis not present

## 2022-05-02 DIAGNOSIS — I10 Essential (primary) hypertension: Secondary | ICD-10-CM | POA: Diagnosis not present

## 2022-05-02 DIAGNOSIS — E1169 Type 2 diabetes mellitus with other specified complication: Secondary | ICD-10-CM | POA: Diagnosis not present

## 2022-05-02 DIAGNOSIS — E785 Hyperlipidemia, unspecified: Secondary | ICD-10-CM | POA: Diagnosis not present

## 2022-05-02 DIAGNOSIS — I340 Nonrheumatic mitral (valve) insufficiency: Secondary | ICD-10-CM | POA: Diagnosis not present

## 2022-05-24 DIAGNOSIS — G20 Parkinson's disease: Secondary | ICD-10-CM | POA: Diagnosis not present

## 2022-05-24 DIAGNOSIS — N401 Enlarged prostate with lower urinary tract symptoms: Secondary | ICD-10-CM | POA: Diagnosis not present

## 2022-05-24 DIAGNOSIS — E785 Hyperlipidemia, unspecified: Secondary | ICD-10-CM | POA: Diagnosis not present

## 2022-05-24 DIAGNOSIS — I2510 Atherosclerotic heart disease of native coronary artery without angina pectoris: Secondary | ICD-10-CM | POA: Diagnosis not present

## 2022-05-24 DIAGNOSIS — E1140 Type 2 diabetes mellitus with diabetic neuropathy, unspecified: Secondary | ICD-10-CM | POA: Diagnosis not present

## 2022-05-24 DIAGNOSIS — I351 Nonrheumatic aortic (valve) insufficiency: Secondary | ICD-10-CM | POA: Diagnosis not present

## 2022-05-24 DIAGNOSIS — I340 Nonrheumatic mitral (valve) insufficiency: Secondary | ICD-10-CM | POA: Diagnosis not present

## 2022-05-24 DIAGNOSIS — E1169 Type 2 diabetes mellitus with other specified complication: Secondary | ICD-10-CM | POA: Diagnosis not present

## 2022-05-24 DIAGNOSIS — E119 Type 2 diabetes mellitus without complications: Secondary | ICD-10-CM | POA: Diagnosis not present

## 2022-05-24 DIAGNOSIS — I10 Essential (primary) hypertension: Secondary | ICD-10-CM | POA: Diagnosis not present

## 2022-07-27 ENCOUNTER — Encounter: Payer: Self-pay | Admitting: Emergency Medicine

## 2022-07-27 ENCOUNTER — Emergency Department
Admission: EM | Admit: 2022-07-27 | Discharge: 2022-07-27 | Disposition: A | Discharge status: Home / Self Care | Payer: Medicare Other | Attending: Emergency Medicine | Admitting: Emergency Medicine

## 2022-07-27 ENCOUNTER — Other Ambulatory Visit: Payer: Self-pay

## 2022-07-27 DIAGNOSIS — I10 Essential (primary) hypertension: Secondary | ICD-10-CM | POA: Insufficient documentation

## 2022-07-27 DIAGNOSIS — W1839XA Other fall on same level, initial encounter: Secondary | ICD-10-CM | POA: Insufficient documentation

## 2022-07-27 DIAGNOSIS — S01511A Laceration without foreign body of lip, initial encounter: Secondary | ICD-10-CM | POA: Diagnosis not present

## 2022-07-27 DIAGNOSIS — S0993XA Unspecified injury of face, initial encounter: Secondary | ICD-10-CM | POA: Diagnosis present

## 2022-07-27 DIAGNOSIS — E119 Type 2 diabetes mellitus without complications: Secondary | ICD-10-CM | POA: Insufficient documentation

## 2022-07-27 MED ORDER — LIDOCAINE-EPINEPHRINE-TETRACAINE (LET) TOPICAL GEL
3.0000 mL | Freq: Once | TOPICAL | Status: AC
  Administered 2022-07-27: 3 mL via TOPICAL
  Filled 2022-07-27: qty 3

## 2022-07-27 MED ORDER — LIDOCAINE-EPINEPHRINE (PF) 2 %-1:200000 IJ SOLN
10.0000 mL | Freq: Once | INTRAMUSCULAR | Status: AC
  Administered 2022-07-27: 10 mL
  Filled 2022-07-27: qty 20

## 2022-07-27 NOTE — ED Triage Notes (Signed)
First Nurse: Pt here with a fall today. Pt has open laceration to the top of his lip. Pt ambulatory to triage.

## 2022-07-27 NOTE — ED Notes (Signed)
See triage note  Presents s/p fall  States he was standing at the coffee bar this am and fell   hitting his chin/lip   Laceration noted to upper lip  Thinks he may have had a syncopal episode  States this has happened to him in the past d/t his regular meds

## 2022-07-27 NOTE — Discharge Instructions (Addendum)
Follow-up with your primary care provider or urgent care for suture removal in 5 to 6 days.  Keep the area clean and dry and watch for any signs of infection.  You may take Tylenol as needed for pain.  Also occasional ice pack will help reduce swelling.  Continue with your regular medications.

## 2022-07-27 NOTE — ED Provider Notes (Signed)
[facility] Provider Note    Event Date/Time   First MD Initiated Contact with Patient 07/27/22 1012     (approximate)   History   Fall   HPI  Bruce Koy. is a 76 y.o. male   presents to the ED with a mechanical fall that occurred today.  Patient has laceration to his upper lip.  He denies any head injury or loss of consciousness.  He denies any other injuries to his body.  Patient has a history of diabetes, hypertension, sleep apnea, alcoholism.      Physical Exam   Triage Vital Signs: ED Triage Vitals  Enc Vitals Group     BP 07/27/22 0920 123/61     Pulse Rate 07/27/22 0920 68     Resp 07/27/22 0920 16     Temp 07/27/22 0920 97.8 F (36.6 C)     Temp Source 07/27/22 0920 Oral     SpO2 07/27/22 0920 97 %     Weight 07/27/22 0915 218 lb 14.7 oz (99.3 kg)     Height 07/27/22 0915 6' 1.25" (1.861 m)     Head Circumference --      Peak Flow --      Pain Score --      Pain Loc --      Pain Edu? --      Excl. in [place]? --     Most recent vital signs: Vitals:   07/27/22 0920  BP: 123/61  Pulse: 68  Resp: 16  Temp: 97.8 F (36.6 C)  SpO2: 97%     General: Awake, no distress.  CV:  Good peripheral perfusion.  Resp:  Normal effort.  Abd:  No distention.  Other:  Move upper and lower extremities without any difficulty and patient speech is normal.  There was no dental injury noted.  Patient does have a laceration to the right upper lip as noted on procedure.  No foreign body was noted.  No cervical tenderness.  Patient is alert and oriented and very talkative.   ED Results / Procedures / Treatments   Labs (all labs ordered are listed, but only abnormal results are displayed) Labs Reviewed - No data to display     PROCEDURES:  Critical Care performed:   Marland KitchenMarland KitchenLaceration Repair  Date/Time: 07/27/2022 11:45 AM  Performed by: Johnn Hai, PA-C Authorized by: Johnn Hai, PA-C   Consent:    Consent obtained:   Verbal   Consent given by:  Patient   Risks discussed:  Infection, pain, poor cosmetic result and poor wound healing Anesthesia:    Anesthesia method:  Topical application and local infiltration   Topical anesthetic:  LET   Local anesthetic:  Lidocaine 2% WITH epi Laceration details:    Location:  Lip   Lip location:  Upper exterior lip   Length (cm):  2.5 Pre-procedure details:    Preparation:  Patient was prepped and draped in usual sterile fashion Exploration:    Hemostasis achieved with:  LET   Contaminated: no   Treatment:    Area cleansed with:  Saline   Amount of cleaning:  Standard   Irrigation solution:  Sterile saline   Irrigation method:  Syringe Skin repair:    Repair method:  Sutures   Suture size:  5-0   Suture material:  Nylon   Suture technique:  Simple interrupted   Number of sutures:  4 Approximation:    Approximation:  Close   [place]  border well-aligned: no   Repair type:    Repair type:  Simple Post-procedure details:    Dressing:  Open (no dressing)   Procedure completion:  Tolerated    MEDICATIONS ORDERED IN ED: Medications  lidocaine-EPINEPHrine-tetracaine (LET) topical gel (3 mLs Topical Given by Other 07/27/22 1109)  lidocaine-EPINEPHrine (XYLOCAINE W/EPI) 2 %-1:200000 (PF) injection 10 mL (10 mLs Infiltration Given by Other 07/27/22 1130)     IMPRESSION / MDM / ASSESSMENT AND PLAN / ED COURSE  I reviewed the triage vital signs and the nursing notes.   Differential diagnosis includes, but is not limited to, laceration upper lip, mechanical fall, dental injury  76 year old male presents to the ED after mechanical fall which he has a laceration of his right upper lip.  No dental injury occurred and patient denies any loss of consciousness or injury to his head.  Lip laceration was repaired and patient was given information on how to care for this along with having sutures removed.  No complications patient did well during repair.  Sutures were  done by Dan Humphreys, PA-S and also supervised by myself.      Patient's presentation is most consistent with acute complicated illness / injury requiring diagnostic workup.  FINAL CLINICAL IMPRESSION(S) / ED DIAGNOSES   Final diagnoses:  Lip laceration, initial encounter     Rx / DC Orders   ED Discharge Orders     None        Note:  This document was prepared using Dragon voice recognition software and may include unintentional dictation errors.   Johnn Hai, PA-C 07/27/22 1610    Harvest Dark, MD 07/30/22 2244

## 2022-07-28 DIAGNOSIS — R55 Syncope and collapse: Secondary | ICD-10-CM | POA: Diagnosis not present

## 2022-07-28 DIAGNOSIS — R0602 Shortness of breath: Secondary | ICD-10-CM | POA: Diagnosis not present

## 2022-07-28 DIAGNOSIS — E785 Hyperlipidemia, unspecified: Secondary | ICD-10-CM | POA: Diagnosis not present

## 2022-07-28 DIAGNOSIS — I2510 Atherosclerotic heart disease of native coronary artery without angina pectoris: Secondary | ICD-10-CM | POA: Diagnosis not present

## 2022-07-28 DIAGNOSIS — I10 Essential (primary) hypertension: Secondary | ICD-10-CM | POA: Diagnosis not present

## 2022-07-29 DIAGNOSIS — S01511D Laceration without foreign body of lip, subsequent encounter: Secondary | ICD-10-CM | POA: Diagnosis not present

## 2022-07-29 DIAGNOSIS — E785 Hyperlipidemia, unspecified: Secondary | ICD-10-CM | POA: Diagnosis not present

## 2022-07-29 DIAGNOSIS — I951 Orthostatic hypotension: Secondary | ICD-10-CM | POA: Diagnosis not present

## 2022-07-29 DIAGNOSIS — R55 Syncope and collapse: Secondary | ICD-10-CM | POA: Diagnosis not present

## 2022-07-29 DIAGNOSIS — I10 Essential (primary) hypertension: Secondary | ICD-10-CM | POA: Diagnosis not present

## 2022-07-29 DIAGNOSIS — G20 Parkinson's disease: Secondary | ICD-10-CM | POA: Diagnosis not present

## 2022-07-29 DIAGNOSIS — I351 Nonrheumatic aortic (valve) insufficiency: Secondary | ICD-10-CM | POA: Diagnosis not present

## 2022-07-29 DIAGNOSIS — I2510 Atherosclerotic heart disease of native coronary artery without angina pectoris: Secondary | ICD-10-CM | POA: Diagnosis not present

## 2022-07-29 DIAGNOSIS — N401 Enlarged prostate with lower urinary tract symptoms: Secondary | ICD-10-CM | POA: Diagnosis not present

## 2022-07-29 DIAGNOSIS — I340 Nonrheumatic mitral (valve) insufficiency: Secondary | ICD-10-CM | POA: Diagnosis not present

## 2022-07-29 DIAGNOSIS — E1140 Type 2 diabetes mellitus with diabetic neuropathy, unspecified: Secondary | ICD-10-CM | POA: Diagnosis not present

## 2022-07-29 DIAGNOSIS — E1169 Type 2 diabetes mellitus with other specified complication: Secondary | ICD-10-CM | POA: Diagnosis not present

## 2022-08-01 DIAGNOSIS — I951 Orthostatic hypotension: Secondary | ICD-10-CM | POA: Diagnosis not present

## 2022-08-01 DIAGNOSIS — N401 Enlarged prostate with lower urinary tract symptoms: Secondary | ICD-10-CM | POA: Diagnosis not present

## 2022-08-01 DIAGNOSIS — I340 Nonrheumatic mitral (valve) insufficiency: Secondary | ICD-10-CM | POA: Diagnosis not present

## 2022-08-01 DIAGNOSIS — I10 Essential (primary) hypertension: Secondary | ICD-10-CM | POA: Diagnosis not present

## 2022-08-01 DIAGNOSIS — E1140 Type 2 diabetes mellitus with diabetic neuropathy, unspecified: Secondary | ICD-10-CM | POA: Diagnosis not present

## 2022-08-01 DIAGNOSIS — E785 Hyperlipidemia, unspecified: Secondary | ICD-10-CM | POA: Diagnosis not present

## 2022-08-01 DIAGNOSIS — S01511D Laceration without foreign body of lip, subsequent encounter: Secondary | ICD-10-CM | POA: Diagnosis not present

## 2022-08-01 DIAGNOSIS — E1169 Type 2 diabetes mellitus with other specified complication: Secondary | ICD-10-CM | POA: Diagnosis not present

## 2022-08-01 DIAGNOSIS — G20 Parkinson's disease: Secondary | ICD-10-CM | POA: Diagnosis not present

## 2022-08-01 DIAGNOSIS — R55 Syncope and collapse: Secondary | ICD-10-CM | POA: Diagnosis not present

## 2022-08-01 DIAGNOSIS — I351 Nonrheumatic aortic (valve) insufficiency: Secondary | ICD-10-CM | POA: Diagnosis not present

## 2022-08-01 DIAGNOSIS — I2510 Atherosclerotic heart disease of native coronary artery without angina pectoris: Secondary | ICD-10-CM | POA: Diagnosis not present

## 2022-08-15 DIAGNOSIS — R55 Syncope and collapse: Secondary | ICD-10-CM | POA: Diagnosis not present

## 2022-08-15 DIAGNOSIS — I2510 Atherosclerotic heart disease of native coronary artery without angina pectoris: Secondary | ICD-10-CM | POA: Diagnosis not present

## 2022-08-15 DIAGNOSIS — I351 Nonrheumatic aortic (valve) insufficiency: Secondary | ICD-10-CM | POA: Diagnosis not present

## 2022-08-15 DIAGNOSIS — E785 Hyperlipidemia, unspecified: Secondary | ICD-10-CM | POA: Diagnosis not present

## 2022-08-15 DIAGNOSIS — I340 Nonrheumatic mitral (valve) insufficiency: Secondary | ICD-10-CM | POA: Diagnosis not present

## 2022-08-15 DIAGNOSIS — I10 Essential (primary) hypertension: Secondary | ICD-10-CM | POA: Diagnosis not present

## 2022-08-25 DIAGNOSIS — I10 Essential (primary) hypertension: Secondary | ICD-10-CM | POA: Diagnosis not present

## 2022-08-25 DIAGNOSIS — E1169 Type 2 diabetes mellitus with other specified complication: Secondary | ICD-10-CM | POA: Diagnosis not present

## 2022-08-29 DIAGNOSIS — E1169 Type 2 diabetes mellitus with other specified complication: Secondary | ICD-10-CM | POA: Diagnosis not present

## 2022-08-29 DIAGNOSIS — R55 Syncope and collapse: Secondary | ICD-10-CM | POA: Diagnosis not present

## 2022-08-29 DIAGNOSIS — E1140 Type 2 diabetes mellitus with diabetic neuropathy, unspecified: Secondary | ICD-10-CM | POA: Diagnosis not present

## 2022-08-29 DIAGNOSIS — G20 Parkinson's disease: Secondary | ICD-10-CM | POA: Diagnosis not present

## 2022-08-29 DIAGNOSIS — G4700 Insomnia, unspecified: Secondary | ICD-10-CM | POA: Diagnosis not present

## 2022-08-29 DIAGNOSIS — I2510 Atherosclerotic heart disease of native coronary artery without angina pectoris: Secondary | ICD-10-CM | POA: Diagnosis not present

## 2022-08-29 DIAGNOSIS — E785 Hyperlipidemia, unspecified: Secondary | ICD-10-CM | POA: Diagnosis not present

## 2022-08-29 DIAGNOSIS — I10 Essential (primary) hypertension: Secondary | ICD-10-CM | POA: Diagnosis not present

## 2022-08-29 DIAGNOSIS — N401 Enlarged prostate with lower urinary tract symptoms: Secondary | ICD-10-CM | POA: Diagnosis not present

## 2022-09-20 DIAGNOSIS — R42 Dizziness and giddiness: Secondary | ICD-10-CM | POA: Diagnosis not present

## 2022-09-20 DIAGNOSIS — F5101 Primary insomnia: Secondary | ICD-10-CM | POA: Diagnosis not present

## 2022-09-20 DIAGNOSIS — E119 Type 2 diabetes mellitus without complications: Secondary | ICD-10-CM | POA: Diagnosis not present

## 2022-09-20 DIAGNOSIS — G20B1 Parkinson's disease with dyskinesia, without mention of fluctuations: Secondary | ICD-10-CM | POA: Diagnosis not present

## 2022-09-20 DIAGNOSIS — G250 Essential tremor: Secondary | ICD-10-CM | POA: Diagnosis not present

## 2022-10-07 DIAGNOSIS — Z23 Encounter for immunization: Secondary | ICD-10-CM | POA: Diagnosis not present

## 2022-10-11 DIAGNOSIS — I351 Nonrheumatic aortic (valve) insufficiency: Secondary | ICD-10-CM | POA: Diagnosis not present

## 2022-10-11 DIAGNOSIS — E785 Hyperlipidemia, unspecified: Secondary | ICD-10-CM | POA: Diagnosis not present

## 2022-10-11 DIAGNOSIS — R55 Syncope and collapse: Secondary | ICD-10-CM | POA: Diagnosis not present

## 2022-10-11 DIAGNOSIS — I2510 Atherosclerotic heart disease of native coronary artery without angina pectoris: Secondary | ICD-10-CM | POA: Diagnosis not present

## 2022-10-11 DIAGNOSIS — R0602 Shortness of breath: Secondary | ICD-10-CM | POA: Diagnosis not present

## 2022-10-11 DIAGNOSIS — I10 Essential (primary) hypertension: Secondary | ICD-10-CM | POA: Diagnosis not present

## 2022-10-11 DIAGNOSIS — I340 Nonrheumatic mitral (valve) insufficiency: Secondary | ICD-10-CM | POA: Diagnosis not present

## 2022-10-19 ENCOUNTER — Encounter: Payer: Self-pay | Admitting: Urology

## 2022-10-19 ENCOUNTER — Ambulatory Visit (INDEPENDENT_AMBULATORY_CARE_PROVIDER_SITE_OTHER): Payer: Medicare Other | Admitting: Urology

## 2022-10-19 VITALS — BP 130/77 | HR 73 | Ht 74.0 in | Wt 207.0 lb

## 2022-10-19 DIAGNOSIS — R350 Frequency of micturition: Secondary | ICD-10-CM

## 2022-10-19 DIAGNOSIS — R3912 Poor urinary stream: Secondary | ICD-10-CM

## 2022-10-19 DIAGNOSIS — N401 Enlarged prostate with lower urinary tract symptoms: Secondary | ICD-10-CM

## 2022-10-19 DIAGNOSIS — R3914 Feeling of incomplete bladder emptying: Secondary | ICD-10-CM

## 2022-10-19 DIAGNOSIS — N400 Enlarged prostate without lower urinary tract symptoms: Secondary | ICD-10-CM

## 2022-10-19 LAB — BLADDER SCAN AMB NON-IMAGING: Scan Result: 146

## 2022-10-19 MED ORDER — MIRABEGRON ER 25 MG PO TB24: 25.0000 mg | ORAL_TABLET | Freq: Every day | ORAL | 0 refills | Status: DC

## 2022-10-19 MED ORDER — MIRABEGRON ER 50 MG PO TB24: 50.0000 mg | ORAL_TABLET | Freq: Every day | ORAL | 0 refills | Status: DC

## 2022-10-19 NOTE — Progress Notes (Signed)
10/19/2022 9:33 AM   Bruce Wyatt. 1946/07/24 [id number]  Referring provider: Jodi Marble, MD [place],  [place] [address]  Chief Complaint  Patient presents with   Other    HPI: Bruce Wyatt. is a 76 y.o. male referred for evaluation of BPH.  Urinary symptoms include weak urinary stream, urgency, intermittent stream, frequency and sensation of incomplete emptying. IPSS 21/35 Has tried silodosin and tamsulosin and has significant orthostasis.  Presently taking tamsulosin every other day and does feel he gets some improvement in his lower urinary tract symptoms + Parkinson's and intermittent urge incontinence Denies dysuria, gross hematuria No flank, abdominal or pelvic pain  PMH: Past Medical History:  Diagnosis Date   Abnormal colonoscopy    polyps noted, benign, [facility], rec 10 year follow up   Alcoholism ([place])    11 yrs ago    BPH (benign prostatic hyperplasia)    Diabetes mellitus    Diverticulosis    Gallstones    GERD (gastroesophageal reflux disease)    H/O hematuria    Hemorrhoids    Hyperlipidemia    Hypertension    Iron deficiency    Sleep apnea    Vitamin D deficiency     Surgical History: Past Surgical History:  Procedure Laterality Date   CARDIOVASCULAR STRESS TEST  02/12/2009   Negative/AT [facility] Dr Cherre Robins   COLONOSCOPY     CYSTOSCOPY  07/03/2012   RHINOPLASTY     soft palate reduction for apnea   Soft palate reduction for Sleep Apnea     [facility]     VASECTOMY      Home Medications:  Allergies as of 10/19/2022       Reactions   Ace Inhibitors    COUGH        Medication List        Accurate as of October 19, 2022  9:33 AM. If you have any questions, ask your nurse or doctor.          losartan-hydrochlorothiazide 50-12.5 MG tablet Commonly known as: HYZAAR TAKE ONE TABLET BY MOUTH EVERY DAY   metFORMIN 500 MG tablet Commonly  known as: GLUCOPHAGE TAKE 1 TABLET TWICE DAILY WITH MEALS   multivitamin with minerals tablet Take 1 tablet by mouth daily.   Rapaflo 8 MG Caps capsule Generic drug: silodosin TAKE ONE CAPSULE BY MOUTH EVERY DAY WITHBREAKFAST   rosuvastatin 20 MG tablet Commonly known as: CRESTOR TAKE ONE TABLET BY MOUTH EVERY DAY   venlafaxine XR 75 MG 24 hr capsule Commonly known as: EFFEXOR-XR TAKE ONE CAPSULE BY MOUTH EVERY DAY   zolpidem 10 MG tablet Commonly known as: AMBIEN TAKE 1 TABLET AT BEDTIME AS NEEDED FOR SLEEP        Allergies:  Allergies  Allergen Reactions   Ace Inhibitors     COUGH    Family History: Family History  Problem Relation Age of Onset   Breast cancer Mother    Heart disease Father        Heart Attack   Colon cancer Maternal Grandfather    Diabetes Paternal Grandfather    Heart disease Paternal Grandfather    Coronary artery disease Father    Nephrolithiasis Daughter     Social History:  reports that he has never smoked. He has never used smokeless tobacco. He reports that he does not drink alcohol and does not use drugs.   Physical  Exam: BP 130/77   Pulse 73   Ht '6\' 2"'$  (1.88 m)   Wt 207 lb (93.9 kg)   BMI 26.58 kg/m   Constitutional:  Alert and oriented, No acute distress. HEENT: [place] AT Respiratory: Normal respiratory effort, no increased work of breathing. GU: Prostate 45 g, smooth without nodules Skin: No rashes, bruises or suspicious lesions. Psychiatric: Normal mood and affect.   Assessment & Plan:    1.  Lower urinary tract symptoms Most likely multifactorial with BPH and Parkinson's contributing factors PVR 146 mL Continue tamsulosin Add Myrbetriq 25 mg daily-samples given 1 month follow-up for symptom reassessment and PVR   Abbie Sons, MD  [facility] [address] [place], [place] [address] [phone]

## 2022-10-20 ENCOUNTER — Encounter: Payer: Self-pay | Admitting: Urology

## 2022-11-18 ENCOUNTER — Encounter: Payer: Self-pay | Admitting: Urology

## 2022-11-18 ENCOUNTER — Ambulatory Visit (INDEPENDENT_AMBULATORY_CARE_PROVIDER_SITE_OTHER): Payer: Medicare Other | Admitting: Urology

## 2022-11-18 VITALS — BP 104/73 | HR 73 | Ht 74.0 in | Wt 207.0 lb

## 2022-11-18 DIAGNOSIS — R399 Unspecified symptoms and signs involving the genitourinary system: Secondary | ICD-10-CM

## 2022-11-18 DIAGNOSIS — N3281 Overactive bladder: Secondary | ICD-10-CM | POA: Diagnosis not present

## 2022-11-18 DIAGNOSIS — N401 Enlarged prostate with lower urinary tract symptoms: Secondary | ICD-10-CM | POA: Diagnosis not present

## 2022-11-18 LAB — BLADDER SCAN AMB NON-IMAGING: Scan Result: 86

## 2022-11-18 MED ORDER — MIRABEGRON ER 50 MG PO TB24: 50.0000 mg | ORAL_TABLET | Freq: Every day | ORAL | 0 refills | Status: DC

## 2022-11-18 NOTE — Progress Notes (Signed)
11/18/2022 9:59 AM   Bruce Wyatt. 1946-05-27 [id number]  Referring provider: Jodi Marble, MD [address] [place],  [place] [address]  Chief Complaint  Patient presents with   Benign Prostatic Hypertrophy    HPI: Cael Worth. is a 76 y.o. male referred for evaluation of BPH.  Refer to prior note 10/19/2022 PVR was 146 mL Given a trial of Myrbetriq 25 mg and presents for follow-up Did not see any significant improvement in his storage later voiding symptoms on Myrbetriq 25 mg.  No side effects Remains on silodosin which she is taking without dizziness  PMH: Past Medical History:  Diagnosis Date   Abnormal colonoscopy    polyps noted, benign, [facility], rec 10 year follow up   Alcoholism ([place])    11 yrs ago    BPH (benign prostatic hyperplasia)    Diabetes mellitus    Diverticulosis    Gallstones    GERD (gastroesophageal reflux disease)    H/O hematuria    Hemorrhoids    Hyperlipidemia    Hypertension    Iron deficiency    Sleep apnea    Vitamin D deficiency     Surgical History: Past Surgical History:  Procedure Laterality Date   CARDIOVASCULAR STRESS TEST  02/12/2009   Negative/AT [facility] Dr Cherre Robins   COLONOSCOPY     CYSTOSCOPY  07/03/2012   RHINOPLASTY     soft palate reduction for apnea   Soft palate reduction for Sleep Apnea     [place] [facility]     VASECTOMY      Home Medications:  Allergies as of 11/18/2022       Reactions   Ace Inhibitors    COUGH        Medication List        Accurate as of November 18, 2022  9:59 AM. If you have any questions, ask your nurse or doctor.          losartan-hydrochlorothiazide 50-12.5 MG tablet Commonly known as: HYZAAR TAKE ONE TABLET BY MOUTH EVERY DAY   metFORMIN 500 MG tablet Commonly known as: GLUCOPHAGE TAKE 1 TABLET TWICE DAILY WITH MEALS   mirabegron ER 25 MG Tb24 tablet Commonly known as: MYRBETRIQ Take 1 tablet  (25 mg total) by mouth daily.   multivitamin with minerals tablet Take 1 tablet by mouth daily.   Rapaflo 8 MG Caps capsule Generic drug: silodosin TAKE ONE CAPSULE BY MOUTH EVERY DAY WITHBREAKFAST   rosuvastatin 20 MG tablet Commonly known as: CRESTOR TAKE ONE TABLET BY MOUTH EVERY DAY   venlafaxine XR 75 MG 24 hr capsule Commonly known as: EFFEXOR-XR TAKE ONE CAPSULE BY MOUTH EVERY DAY   zolpidem 10 MG tablet Commonly known as: AMBIEN TAKE 1 TABLET AT BEDTIME AS NEEDED FOR SLEEP        Allergies:  Allergies  Allergen Reactions   Ace Inhibitors     COUGH    Family History: Family History  Problem Relation Age of Onset   Breast cancer Mother    Heart disease Father        Heart Attack   Colon cancer Maternal Grandfather    Diabetes Paternal Grandfather    Heart disease Paternal Grandfather    Coronary artery disease Father    Nephrolithiasis Daughter     Social History:  reports that he has never smoked. He has never used smokeless tobacco. He reports that he does not  drink alcohol and does not use drugs.   Physical Exam: BP 104/73   Pulse 73   Ht '6\' 2"'$  (1.88 m)   Wt 207 lb (93.9 kg)   BMI 26.58 kg/m   Constitutional:  Alert and oriented, No acute distress. HEENT: [place] AT Respiratory: Normal respiratory effort, no increased work of breathing. GU: Prostate 45 g, smooth without nodules Skin: No rashes, bruises or suspicious lesions. Psychiatric: Normal mood and affect.   Assessment & Plan:    1.  Lower urinary tract symptoms Most likely multifactorial with BPH and Parkinson's contributing factors PVR better at 86 mL Continue alpha-blocker Titrate Myrbetriq 50 mg mg daily-samples given Call back regarding efficacy   Abbie Sons, MD  [facility] [address] [place], [place] [address] [phone]

## 2022-11-29 DIAGNOSIS — I2510 Atherosclerotic heart disease of native coronary artery without angina pectoris: Secondary | ICD-10-CM | POA: Diagnosis not present

## 2022-11-29 DIAGNOSIS — N401 Enlarged prostate with lower urinary tract symptoms: Secondary | ICD-10-CM | POA: Diagnosis not present

## 2022-11-29 DIAGNOSIS — E1169 Type 2 diabetes mellitus with other specified complication: Secondary | ICD-10-CM | POA: Diagnosis not present

## 2022-12-02 DIAGNOSIS — R55 Syncope and collapse: Secondary | ICD-10-CM | POA: Diagnosis not present

## 2022-12-02 DIAGNOSIS — G4700 Insomnia, unspecified: Secondary | ICD-10-CM | POA: Diagnosis not present

## 2022-12-02 DIAGNOSIS — E785 Hyperlipidemia, unspecified: Secondary | ICD-10-CM | POA: Diagnosis not present

## 2022-12-02 DIAGNOSIS — Z1331 Encounter for screening for depression: Secondary | ICD-10-CM | POA: Diagnosis not present

## 2022-12-02 DIAGNOSIS — E1140 Type 2 diabetes mellitus with diabetic neuropathy, unspecified: Secondary | ICD-10-CM | POA: Diagnosis not present

## 2022-12-02 DIAGNOSIS — I10 Essential (primary) hypertension: Secondary | ICD-10-CM | POA: Diagnosis not present

## 2022-12-02 DIAGNOSIS — I2510 Atherosclerotic heart disease of native coronary artery without angina pectoris: Secondary | ICD-10-CM | POA: Diagnosis not present

## 2022-12-02 DIAGNOSIS — E1169 Type 2 diabetes mellitus with other specified complication: Secondary | ICD-10-CM | POA: Diagnosis not present

## 2022-12-02 DIAGNOSIS — F0280 Dementia in other diseases classified elsewhere without behavioral disturbance: Secondary | ICD-10-CM | POA: Diagnosis not present

## 2022-12-02 DIAGNOSIS — N401 Enlarged prostate with lower urinary tract symptoms: Secondary | ICD-10-CM | POA: Diagnosis not present

## 2022-12-14 ENCOUNTER — Other Ambulatory Visit: Payer: Self-pay | Admitting: Family Medicine

## 2022-12-14 MED ORDER — MIRABEGRON ER 50 MG PO TB24: 50.0000 mg | ORAL_TABLET | Freq: Every day | ORAL | 3 refills | Status: DC

## 2023-02-04 ENCOUNTER — Other Ambulatory Visit: Payer: Self-pay | Admitting: Internal Medicine

## 2023-02-20 DIAGNOSIS — G250 Essential tremor: Secondary | ICD-10-CM | POA: Diagnosis not present

## 2023-02-20 DIAGNOSIS — G20B1 Parkinson's disease with dyskinesia, without mention of fluctuations: Secondary | ICD-10-CM | POA: Diagnosis not present

## 2023-02-20 DIAGNOSIS — E119 Type 2 diabetes mellitus without complications: Secondary | ICD-10-CM | POA: Diagnosis not present

## 2023-02-20 DIAGNOSIS — F5101 Primary insomnia: Secondary | ICD-10-CM | POA: Diagnosis not present

## 2023-02-20 DIAGNOSIS — R42 Dizziness and giddiness: Secondary | ICD-10-CM | POA: Diagnosis not present

## 2023-02-27 ENCOUNTER — Other Ambulatory Visit: Payer: Medicare Other

## 2023-02-27 DIAGNOSIS — E785 Hyperlipidemia, unspecified: Secondary | ICD-10-CM

## 2023-02-27 DIAGNOSIS — I10 Essential (primary) hypertension: Secondary | ICD-10-CM | POA: Diagnosis not present

## 2023-02-27 DIAGNOSIS — E119 Type 2 diabetes mellitus without complications: Secondary | ICD-10-CM

## 2023-02-28 LAB — LIPID PANEL
Chol/HDL Ratio: 1.9 ratio (ref 0.0–5.0)
Cholesterol, Total: 115 mg/dL (ref 100–199)
HDL: 61 mg/dL (ref >39)
LDL Chol Calc (NIH): 37 mg/dL (ref 0–99)
Triglycerides: 88 mg/dL (ref 0–149)
VLDL Cholesterol Cal: 17 mg/dL (ref 5–40)

## 2023-02-28 LAB — CBC WITH DIFFERENTIAL/PLATELET
Basophils Absolute: 0.1 10*3/uL (ref 0.0–0.2)
Basos: 1 %
EOS (ABSOLUTE): 0.3 10*3/uL (ref 0.0–0.4)
Eos: 4 %
Hematocrit: 42.5 % (ref 37.5–51.0)
Hemoglobin: 14.5 g/dL (ref 13.0–17.7)
Immature Grans (Abs): 0 10*3/uL (ref 0.0–0.1)
Immature Granulocytes: 0 %
Lymphocytes Absolute: 1.7 10*3/uL (ref 0.7–3.1)
Lymphs: 22 %
MCH: 29.9 pg (ref 26.6–33.0)
MCHC: 34.1 g/dL (ref 31.5–35.7)
MCV: 88 fL (ref 79–97)
Monocytes Absolute: 0.7 10*3/uL (ref 0.1–0.9)
Monocytes: 9 %
Neutrophils Absolute: 4.9 10*3/uL (ref 1.4–7.0)
Neutrophils: 64 %
Platelets: 160 10*3/uL (ref 150–450)
RBC: 4.85 x10E6/uL (ref 4.14–5.80)
RDW: 12.9 % (ref 11.6–15.4)
WBC: 7.5 10*3/uL (ref 3.4–10.8)

## 2023-02-28 LAB — CMP14+EGFR
ALT: 13 IU/L (ref 0–44)
AST: 13 IU/L (ref 0–40)
Albumin/Globulin Ratio: 2.2 (ref 1.2–2.2)
Albumin: 4.3 g/dL (ref 3.8–4.8)
Alkaline Phosphatase: 90 IU/L (ref 44–121)
BUN/Creatinine Ratio: 13 (ref 10–24)
BUN: 13 mg/dL (ref 8–27)
Bilirubin Total: 0.5 mg/dL (ref 0.0–1.2)
CO2: 22 mmol/L (ref 20–29)
Calcium: 9.3 mg/dL (ref 8.6–10.2)
Chloride: 103 mmol/L (ref 96–106)
Creatinine, Ser: 0.97 mg/dL (ref 0.76–1.27)
Globulin, Total: 2 g/dL (ref 1.5–4.5)
Glucose: 139 mg/dL — ABNORMAL HIGH (ref 70–99)
Potassium: 4.3 mmol/L (ref 3.5–5.2)
Sodium: 140 mmol/L (ref 134–144)
Total Protein: 6.3 g/dL (ref 6.0–8.5)
eGFR: 81 mL/min/{1.73_m2} (ref >59)

## 2023-02-28 LAB — HEMOGLOBIN A1C
Est. average glucose Bld gHb Est-mCnc: 140 mg/dL
Hgb A1c MFr Bld: 6.5 % — ABNORMAL HIGH (ref 4.8–5.6)

## 2023-03-01 ENCOUNTER — Ambulatory Visit (INDEPENDENT_AMBULATORY_CARE_PROVIDER_SITE_OTHER): Payer: Medicare Other | Admitting: Internal Medicine

## 2023-03-01 ENCOUNTER — Encounter: Payer: Self-pay | Admitting: Internal Medicine

## 2023-03-01 VITALS — BP 128/72 | HR 66 | Ht 74.0 in | Wt 217.2 lb

## 2023-03-01 DIAGNOSIS — Z1331 Encounter for screening for depression: Secondary | ICD-10-CM

## 2023-03-01 DIAGNOSIS — E119 Type 2 diabetes mellitus without complications: Secondary | ICD-10-CM | POA: Diagnosis not present

## 2023-03-01 DIAGNOSIS — F5101 Primary insomnia: Secondary | ICD-10-CM | POA: Diagnosis not present

## 2023-03-01 DIAGNOSIS — I10 Essential (primary) hypertension: Secondary | ICD-10-CM

## 2023-03-01 DIAGNOSIS — E782 Mixed hyperlipidemia: Secondary | ICD-10-CM | POA: Diagnosis not present

## 2023-03-01 DIAGNOSIS — Z0001 Encounter for general adult medical examination with abnormal findings: Secondary | ICD-10-CM

## 2023-03-01 DIAGNOSIS — Z0000 Encounter for general adult medical examination without abnormal findings: Secondary | ICD-10-CM

## 2023-03-01 LAB — GLUCOSE, POCT (MANUAL RESULT ENTRY): POC Glucose: 135 mg/dl — AB (ref 70–99)

## 2023-03-01 MED ORDER — RYBELSUS 14 MG PO TABS: 14.0000 mg | ORAL_TABLET | Freq: Every day | ORAL | 0 refills | Status: DC

## 2023-03-01 MED ORDER — BELSOMRA 10 MG PO TABS: 10.0000 mg | ORAL_TABLET | Freq: Every day | ORAL | 2 refills | Status: DC

## 2023-03-01 MED ORDER — ZOLPIDEM TARTRATE 10 MG PO TABS: 10.0000 mg | ORAL_TABLET | Freq: Every day | ORAL | 2 refills | Status: DC

## 2023-03-01 NOTE — Progress Notes (Signed)
Established Patient Office Visit  Subjective:  Patient ID: Bruce Hughart., male    DOB: [birth year]  Age: 77 y.o. MRN: [id number]  Chief Complaint  Patient presents with   Annual Exam   Follow-up    AWV and 3 month follow up    No new complaints, here AWV and refer to scanned documents. Labs reviewed and notable for well controlled diabetes, A1c at target and lipids at target with unremarkable cmp. Denies any hypoglycemic episodes and home bg readings have been at target although only checks them rarely.      Past Medical History:  Diagnosis Date   Abnormal colonoscopy    polyps noted, benign, [facility], rec 10 year follow up   Alcoholism ([place])    11 yrs ago    BPH (benign prostatic hyperplasia)    Diabetes mellitus    Diverticulosis    Gallstones    GERD (gastroesophageal reflux disease)    H/O hematuria    Hemorrhoids    Hyperlipidemia    Hypertension    Iron deficiency    Sleep apnea    Vitamin D deficiency     Past Surgical History:  Procedure Laterality Date   CARDIOVASCULAR STRESS TEST  02/12/2009   Negative/AT [facility] Dr Cherre Robins   COLONOSCOPY     CYSTOSCOPY  07/03/2012   RHINOPLASTY     soft palate reduction for apnea   Soft palate reduction for Sleep Apnea     [facility]   TONSILLECTOMY     [facility] GASTROINTESTINAL ENDOSCOPY     VASECTOMY      Social History   Socioeconomic History   Marital status: Married    Spouse name: Not on file   Number of children: 2   Years of education: Not on file   Highest education level: Not on file  Occupational History   Occupation: VP of HR    Employer: [employer]  Tobacco Use   Smoking status: Never   Smokeless tobacco: Never  Substance and Sexual Activity   Alcohol use: No    Comment: quit 2002   Drug use: No   Sexual activity: Not Currently  Other Topics Concern   Not on file  Social History Narrative   Lives in [place], [place] with wife.    2 daughters   No pets. Hobbies: fishing.   Advertising copywriter, [employer]   Regular Exercise -  NO   Daily Caffeine Use:  2 -4 coffee    No current alcohol history of alcoholism   No tobacco   Social Determinants of Radio broadcast assistant Strain: Not on file  Food Insecurity: Not on file  Transportation Needs: Not on file  Physical Activity: Not on file  Stress: Not on file  Social Connections: Not on file  Intimate Partner Violence: Not on file    Family History  Problem Relation Age of Onset   Breast cancer Mother    Heart disease Father        Heart Attack   Colon cancer Maternal Grandfather    Diabetes Paternal Grandfather    Heart disease Paternal Grandfather    Coronary artery disease Father    Nephrolithiasis Daughter     Allergies  Allergen Reactions   Ace Inhibitors     COUGH    Review of Systems  Constitutional: Negative.        Gained 10 lbs  HENT: Negative.    Eyes: Negative.   Respiratory: Negative.  Cardiovascular: Negative.   Gastrointestinal: Negative.   Genitourinary:        Nocturia  Musculoskeletal: Negative.   Skin: Negative.   Neurological: Negative.   Endo/Heme/Allergies: Negative.        Objective:   BP 128/72   Pulse 66   Ht '6\' 2"'$  (1.88 m)   Wt 217 lb 3.2 oz (98.5 kg)   SpO2 95%   BMI 27.89 kg/m   Vitals:   03/01/23 0823  BP: 128/72  Pulse: 66  Height: '6\' 2"'$  (1.88 m)  Weight: 217 lb 3.2 oz (98.5 kg)  SpO2: 95%  BMI (Calculated): 27.87    Physical Exam Vitals reviewed.  Constitutional:      Appearance: Normal appearance. He is overweight.  HENT:     Head: Normocephalic.     Left Ear: There is no impacted cerumen.     Nose: Nose normal.     Mouth/Throat:     Mouth: Mucous membranes are moist.     Pharynx: No posterior oropharyngeal erythema.  Eyes:     Extraocular Movements: Extraocular movements intact.     Pupils: Pupils are equal, round, and reactive to light.  Cardiovascular:     Rate and Rhythm: Regular rhythm.     Chest Wall: PMI is not  displaced.     Pulses: Normal pulses.     Heart sounds: Normal heart sounds. No murmur heard. Pulmonary:     Effort: Pulmonary effort is normal.     Breath sounds: Normal air entry. No rhonchi or rales.  Abdominal:     General: Abdomen is flat. Bowel sounds are normal. There is no distension.     Palpations: Abdomen is soft. There is no hepatomegaly, splenomegaly or mass.     Tenderness: There is no abdominal tenderness.  Musculoskeletal:        General: Normal range of motion.     Cervical back: Normal range of motion and neck supple.     Right lower leg: No edema.     Left lower leg: No edema.  Skin:    General: Skin is warm and dry.  Neurological:     General: No focal deficit present.     Mental Status: He is alert and oriented to person, place, and time.     Cranial Nerves: No cranial nerve deficit.     Motor: Tremor present. No weakness.  Psychiatric:        Mood and Affect: Mood normal.        Behavior: Behavior normal.      Results for orders placed or performed in visit on 03/01/23  POCT Glucose (CBG)  Result Value Ref Range   POC Glucose 135 (A) 70 - 99 mg/dl    Recent Results (from the past 2160 hour(Pheng Prokop))  CMP14+EGFR     Status: Abnormal   Collection Time: 02/27/23  9:07 AM  Result Value Ref Range   Glucose 139 (H) 70 - 99 mg/dL   BUN 13 8 - 27 mg/dL   Creatinine, Ser 0.97 0.76 - 1.27 mg/dL   eGFR 81 >59 mL/min/1.73   BUN/Creatinine Ratio 13 10 - 24   Sodium 140 134 - 144 mmol/L   Potassium 4.3 3.5 - 5.2 mmol/L   Chloride 103 96 - 106 mmol/L   CO2 22 20 - 29 mmol/L   Calcium 9.3 8.6 - 10.2 mg/dL   Total Protein 6.3 6.0 - 8.5 g/dL   Albumin 4.3 3.8 - 4.8 g/dL   Globulin, Total 2.0  1.5 - 4.5 g/dL   Albumin/Globulin Ratio 2.2 1.2 - 2.2   Bilirubin Total 0.5 0.0 - 1.2 mg/dL   Alkaline Phosphatase 90 44 - 121 IU/L   AST 13 0 - 40 IU/L   ALT 13 0 - 44 IU/L  CBC with Diff     Status: None   Collection Time: 02/27/23  9:07 AM  Result Value Ref Range   WBC  7.5 3.4 - 10.8 x10E3/uL   RBC 4.85 4.14 - 5.80 x10E6/uL   Hemoglobin 14.5 13.0 - 17.7 g/dL   Hematocrit 42.5 37.5 - 51.0 %   MCV 88 79 - 97 fL   MCH 29.9 26.6 - 33.0 pg   MCHC 34.1 31.5 - 35.7 g/dL   RDW 12.9 11.6 - 15.4 %   Platelets 160 150 - 450 x10E3/uL   Neutrophils 64 Not Estab. %   Lymphs 22 Not Estab. %   Monocytes 9 Not Estab. %   Eos 4 Not Estab. %   Basos 1 Not Estab. %   Neutrophils Absolute 4.9 1.4 - 7.0 x10E3/uL   Lymphocytes Absolute 1.7 0.7 - 3.1 x10E3/uL   Monocytes Absolute 0.7 0.1 - 0.9 x10E3/uL   EOS (ABSOLUTE) 0.3 0.0 - 0.4 x10E3/uL   Basophils Absolute 0.1 0.0 - 0.2 x10E3/uL   Immature Granulocytes 0 Not Estab. %   Immature Grans (Abs) 0.0 0.0 - 0.1 x10E3/uL  Hemoglobin A1c     Status: Abnormal   Collection Time: 02/27/23  9:07 AM  Result Value Ref Range   Hgb A1c MFr Bld 6.5 (H) 4.8 - 5.6 %    Comment:          Prediabetes: 5.7 - 6.4          Diabetes: >6.4          Glycemic control for adults with diabetes: <7.0    Est. average glucose Bld gHb Est-mCnc 140 mg/dL  Lipid Profile     Status: None   Collection Time: 02/27/23  9:07 AM  Result Value Ref Range   Cholesterol, Total 115 100 - 199 mg/dL   Triglycerides 88 0 - 149 mg/dL   HDL 61 >39 mg/dL   VLDL Cholesterol Cal 17 5 - 40 mg/dL   LDL Chol Calc (NIH) 37 0 - 99 mg/dL   Chol/HDL Ratio 1.9 0.0 - 5.0 ratio    Comment:                                   T. Chol/HDL Ratio                                             Men  Women                               1/2 Avg.Risk  3.4    3.3                                   Avg.Risk  5.0    4.4  2X Avg.Risk  9.6    7.1                                3X Avg.Risk 23.4   11.0   POCT Glucose (CBG)     Status: Abnormal   Collection Time: 03/01/23  8:40 AM  Result Value Ref Range   POC Glucose 135 (A) 70 - 99 mg/dl      Assessment & Plan:   Problem List Items Addressed This Visit       Cardiovascular and Mediastinum    Hypertension     Other   Hyperlipidemia   Medicare annual wellness visit, subsequent   Relevant Orders   Ambulatory referral to Gastroenterology   Other Visit Diagnoses     Type 2 diabetes mellitus without complication, without long-term current use of insulin (HCC)    -  Primary   Relevant Medications   Semaglutide (RYBELSUS) 14 MG TABS   Other Relevant Orders   POCT Glucose (CBG) (Completed)   Hemoglobin A1c   Lipid panel   Primary insomnia       Relevant Medications   Suvorexant (BELSOMRA) 10 MG TABS      Offered Belsomra but prefers to remain on Ambien No follow-ups on file.   Total time spent: 30 minutes  Volanda Napoleon, MD  03/01/2023

## 2023-03-02 ENCOUNTER — Other Ambulatory Visit: Payer: Self-pay | Admitting: Internal Medicine

## 2023-03-03 ENCOUNTER — Ambulatory Visit: Payer: Medicare Other | Admitting: Internal Medicine

## 2023-03-13 DIAGNOSIS — H0288A Meibomian gland dysfunction right eye, upper and lower eyelids: Secondary | ICD-10-CM | POA: Diagnosis not present

## 2023-03-13 DIAGNOSIS — H0288B Meibomian gland dysfunction left eye, upper and lower eyelids: Secondary | ICD-10-CM | POA: Diagnosis not present

## 2023-03-13 DIAGNOSIS — Z961 Presence of intraocular lens: Secondary | ICD-10-CM | POA: Diagnosis not present

## 2023-03-13 DIAGNOSIS — H43393 Other vitreous opacities, bilateral: Secondary | ICD-10-CM | POA: Diagnosis not present

## 2023-04-08 ENCOUNTER — Other Ambulatory Visit: Payer: Self-pay | Admitting: Urology

## 2023-04-08 ENCOUNTER — Other Ambulatory Visit: Payer: Self-pay | Admitting: Cardiovascular Disease

## 2023-04-10 ENCOUNTER — Other Ambulatory Visit: Payer: Self-pay | Admitting: Internal Medicine

## 2023-04-13 ENCOUNTER — Other Ambulatory Visit: Payer: Self-pay | Admitting: Internal Medicine

## 2023-05-09 ENCOUNTER — Other Ambulatory Visit: Payer: Self-pay | Admitting: Cardiovascular Disease

## 2023-05-22 ENCOUNTER — Other Ambulatory Visit: Payer: Self-pay | Admitting: Internal Medicine

## 2023-05-22 DIAGNOSIS — E119 Type 2 diabetes mellitus without complications: Secondary | ICD-10-CM

## 2023-05-29 ENCOUNTER — Other Ambulatory Visit: Payer: Self-pay

## 2023-05-29 ENCOUNTER — Other Ambulatory Visit: Payer: Medicare Other

## 2023-05-29 DIAGNOSIS — E119 Type 2 diabetes mellitus without complications: Secondary | ICD-10-CM | POA: Diagnosis not present

## 2023-05-30 LAB — LIPID PANEL
Chol/HDL Ratio: 2 ratio (ref 0.0–5.0)
Cholesterol, Total: 105 mg/dL (ref 100–199)
HDL: 52 mg/dL (ref >39)
LDL Chol Calc (NIH): 33 mg/dL (ref 0–99)
Triglycerides: 106 mg/dL (ref 0–149)
VLDL Cholesterol Cal: 20 mg/dL (ref 5–40)

## 2023-05-30 LAB — HEMOGLOBIN A1C
Est. average glucose Bld gHb Est-mCnc: 151 mg/dL
Hgb A1c MFr Bld: 6.9 % — ABNORMAL HIGH (ref 4.8–5.6)

## 2023-05-31 ENCOUNTER — Encounter: Payer: Self-pay | Admitting: Internal Medicine

## 2023-05-31 ENCOUNTER — Ambulatory Visit (INDEPENDENT_AMBULATORY_CARE_PROVIDER_SITE_OTHER): Payer: Medicare Other | Admitting: Internal Medicine

## 2023-05-31 VITALS — BP 108/68 | HR 82 | Ht 74.0 in | Wt 215.2 lb

## 2023-05-31 DIAGNOSIS — E119 Type 2 diabetes mellitus without complications: Secondary | ICD-10-CM

## 2023-05-31 DIAGNOSIS — I10 Essential (primary) hypertension: Secondary | ICD-10-CM | POA: Diagnosis not present

## 2023-05-31 DIAGNOSIS — F5101 Primary insomnia: Secondary | ICD-10-CM

## 2023-05-31 DIAGNOSIS — E782 Mixed hyperlipidemia: Secondary | ICD-10-CM | POA: Diagnosis not present

## 2023-05-31 LAB — POCT CBG (FASTING - GLUCOSE)-MANUAL ENTRY: Glucose Fasting, POC: 168 mg/dL — AB (ref 70–99)

## 2023-05-31 MED ORDER — ZOLPIDEM TARTRATE 10 MG PO TABS: 10.0000 mg | ORAL_TABLET | Freq: Every day | ORAL | 2 refills | Status: DC

## 2023-05-31 NOTE — Progress Notes (Signed)
Established Patient Office Visit  Subjective:  Patient ID: Bruce Heinig., male    DOB: Apr 27, 1946  Age: 77 y.o. MRN: [id number]  Chief Complaint  Patient presents with   Follow-up    3 month follow up, discuss lab results.    No new complaints, here for lab review and medication refills. Labs reviewed and notable for well controlled diabetes, A1c higher but still at target, lipids at target with unremarkable cmp. Denies any hypoglycemic symptoms but admits to dietary indiscretion. No further dizziness.     No other concerns at this time.   Past Medical History:  Diagnosis Date   Abnormal colonoscopy    polyps noted, benign, [facility], rec 10 year follow up   Alcoholism (HCC)    11 yrs ago    BPH (benign prostatic hyperplasia)    Diabetes mellitus    Diverticulosis    Gallstones    GERD (gastroesophageal reflux disease)    H/O hematuria    Hemorrhoids    Hyperlipidemia    Hypertension    Iron deficiency    Sleep apnea    Vitamin D deficiency     Past Surgical History:  Procedure Laterality Date   CARDIOVASCULAR STRESS TEST  02/12/2009   Negative/AT [facility] Dr Domenic Polite   COLONOSCOPY     CYSTOSCOPY  07/03/2012   RHINOPLASTY     soft palate reduction for apnea   Soft palate reduction for Sleep Apnea     [facility]   TONSILLECTOMY     UPPER GASTROINTESTINAL ENDOSCOPY     VASECTOMY      Social History   Socioeconomic History   Marital status: Married    Spouse name: Not on file   Number of children: 2   Years of education: Not on file   Highest education level: Not on file  Occupational History   Occupation: VP of HR    Employer: [employer]  Tobacco Use   Smoking status: Never   Smokeless tobacco: Never  Substance and Sexual Activity   Alcohol use: No    Comment: quit 2002   Drug use: No   Sexual activity: Not Currently  Other Topics Concern   Not on file  Social History Narrative   Lives in [place], [place] with wife.    2 daughters   No  pets. Hobbies: fishing.   Consulting civil engineer, [employer]   Regular Exercise -  NO   Daily Caffeine Use:  2 -4 coffee    No current alcohol history of alcoholism   No tobacco   Social Determinants of Corporate investment banker Strain: Not on file  Food Insecurity: Not on file  Transportation Needs: Not on file  Physical Activity: Not on file  Stress: Not on file  Social Connections: Not on file  Intimate Partner Violence: Not on file    Family History  Problem Relation Age of Onset   Breast cancer Mother    Heart disease Father        Heart Attack   Colon cancer Maternal Grandfather    Diabetes Paternal Grandfather    Heart disease Paternal Grandfather    Coronary artery disease Father    Nephrolithiasis Daughter     Allergies  Allergen Reactions   Ace Inhibitors     COUGH    Review of Systems  Constitutional: Negative.   HENT: Negative.    Eyes: Negative.   Respiratory: Negative.    Cardiovascular: Negative.   Gastrointestinal: Negative.  Genitourinary:        Nocturia  Musculoskeletal: Negative.   Skin: Negative.   Neurological: Negative.   Endo/Heme/Allergies: Negative.        Objective:   BP 108/68   Pulse 82   Ht 6\' 2"  (1.88 m)   Wt 215 lb 3.2 oz (97.6 kg)   SpO2 95%   BMI 27.63 kg/m   Vitals:   05/31/23 0830  BP: 108/68  Pulse: 82  Height: 6\' 2"  (1.88 m)  Weight: 215 lb 3.2 oz (97.6 kg)  SpO2: 95%  BMI (Calculated): 27.62    Physical Exam Vitals and nursing note reviewed.  Constitutional:      Appearance: Normal appearance. He is overweight.  HENT:     Head: Normocephalic.     Left Ear: There is no impacted cerumen.     Nose: Nose normal.     Mouth/Throat:     Mouth: Mucous membranes are moist.     Pharynx: No posterior oropharyngeal erythema.  Eyes:     Extraocular Movements: Extraocular movements intact.     Pupils: Pupils are equal, round, and reactive to light.  Cardiovascular:     Rate and Rhythm: Regular rhythm.      Chest Wall: PMI is not displaced.     Pulses: Normal pulses.     Heart sounds: Normal heart sounds. No murmur heard. Pulmonary:     Effort: Pulmonary effort is normal.     Breath sounds: Normal air entry. No rhonchi or rales.  Abdominal:     General: Abdomen is flat. Bowel sounds are normal. There is no distension.     Palpations: Abdomen is soft. There is no hepatomegaly, splenomegaly or mass.     Tenderness: There is no abdominal tenderness.  Musculoskeletal:        General: Normal range of motion.     Cervical back: Normal range of motion and neck supple.     Right lower leg: No edema.     Left lower leg: No edema.  Skin:    General: Skin is warm and dry.  Neurological:     General: No focal deficit present.     Mental Status: He is alert and oriented to person, place, and time.     Cranial Nerves: No cranial nerve deficit.     Motor: Tremor present. No weakness.  Psychiatric:        Mood and Affect: Mood normal.        Behavior: Behavior normal.      Results for orders placed or performed in visit on 05/31/23  POCT CBG (Fasting - Glucose)  Result Value Ref Range   Glucose Fasting, POC 168 (A) 70 - 99 mg/dL    Recent Results (from the past 2160 hour(Margi Edmundson))  Lipid panel     Status: None   Collection Time: 05/29/23  8:33 AM  Result Value Ref Range   Cholesterol, Total 105 100 - 199 mg/dL   Triglycerides 409 0 - 149 mg/dL   HDL 52 >81 mg/dL   VLDL Cholesterol Cal 20 5 - 40 mg/dL   LDL Chol Calc (NIH) 33 0 - 99 mg/dL   LDL CALC COMMENT: CANCELED     Comment: Test not performed  Result canceled by the ancillary.    Chol/HDL Ratio 2.0 0.0 - 5.0 ratio    Comment:  T. Chol/HDL Ratio                                             Men  Women                               1/2 Avg.Risk  3.4    3.3                                   Avg.Risk  5.0    4.4                                2X Avg.Risk  9.6    7.1                                3X  Avg.Risk 23.4   11.0   Hemoglobin A1c     Status: Abnormal   Collection Time: 05/29/23  8:33 AM  Result Value Ref Range   Hgb A1c MFr Bld 6.9 (H) 4.8 - 5.6 %    Comment:          Prediabetes: 5.7 - 6.4          Diabetes: >6.4          Glycemic control for adults with diabetes: <7.0    Est. average glucose Bld gHb Est-mCnc 151 mg/dL  POCT CBG (Fasting - Glucose)     Status: Abnormal   Collection Time: 05/31/23  8:36 AM  Result Value Ref Range   Glucose Fasting, POC 168 (A) 70 - 99 mg/dL      Assessment & Plan:  As per problem list Stricter low calorie diet and exercise as much as possible. Continue to hold ARB as BP still low.  Problem List Items Addressed This Visit       Cardiovascular and Mediastinum   Hypertension   Relevant Medications   zolpidem (AMBIEN) 10 MG tablet     Endocrine   Type 2 diabetes mellitus without complication, without long-term current use of insulin (HCC) - Primary   Relevant Medications   zolpidem (AMBIEN) 10 MG tablet   Other Relevant Orders   POCT CBG (Fasting - Glucose) (Completed)   Hemoglobin A1c   Lipid panel     Other   Hyperlipidemia   Relevant Orders   Lipid panel   Comprehensive metabolic panel   CK   Other Visit Diagnoses     Primary insomnia       Relevant Medications   zolpidem (AMBIEN) 10 MG tablet       Return in about 3 months (around 08/31/2023) for fu with labs prior.   Total time spent: 20 minutes  Luna Fuse, MD  05/31/2023   This document may have been prepared by [facility] Voice Recognition software and as such may include unintentional dictation errors.

## 2023-06-06 ENCOUNTER — Telehealth: Payer: Self-pay | Admitting: Urology

## 2023-06-06 NOTE — Telephone Encounter (Signed)
Yes please schedule annual in December 2024.

## 2023-06-06 NOTE — Telephone Encounter (Signed)
Patient called to see if he has an upcoming appt, or if he had missed one. There aren't any missed or upcoming appointments scheduled. Last visit note didn't mention follow up appt. Does he need to schedule an annual follow up for December? Please advise.

## 2023-06-15 ENCOUNTER — Other Ambulatory Visit: Payer: Self-pay | Admitting: Cardiovascular Disease

## 2023-06-20 DIAGNOSIS — K219 Gastro-esophageal reflux disease without esophagitis: Secondary | ICD-10-CM | POA: Diagnosis not present

## 2023-06-20 DIAGNOSIS — Z1211 Encounter for screening for malignant neoplasm of colon: Secondary | ICD-10-CM | POA: Diagnosis not present

## 2023-07-13 ENCOUNTER — Other Ambulatory Visit: Payer: Self-pay | Admitting: Nurse Practitioner

## 2023-07-14 ENCOUNTER — Other Ambulatory Visit: Payer: Self-pay | Admitting: Internal Medicine

## 2023-07-24 ENCOUNTER — Other Ambulatory Visit: Payer: Self-pay | Admitting: Cardiovascular Disease

## 2023-07-27 ENCOUNTER — Other Ambulatory Visit: Payer: Self-pay | Admitting: Urology

## 2023-08-24 ENCOUNTER — Other Ambulatory Visit: Payer: Self-pay | Admitting: Urology

## 2023-08-24 ENCOUNTER — Other Ambulatory Visit: Payer: Self-pay | Admitting: Internal Medicine

## 2023-08-24 DIAGNOSIS — E119 Type 2 diabetes mellitus without complications: Secondary | ICD-10-CM

## 2023-08-24 DIAGNOSIS — I10 Essential (primary) hypertension: Secondary | ICD-10-CM

## 2023-08-24 DIAGNOSIS — F5101 Primary insomnia: Secondary | ICD-10-CM

## 2023-08-25 ENCOUNTER — Other Ambulatory Visit: Payer: Self-pay

## 2023-08-25 ENCOUNTER — Other Ambulatory Visit: Payer: Self-pay | Admitting: Cardiovascular Disease

## 2023-09-04 ENCOUNTER — Other Ambulatory Visit: Payer: Medicare Other

## 2023-09-04 DIAGNOSIS — E782 Mixed hyperlipidemia: Secondary | ICD-10-CM

## 2023-09-04 DIAGNOSIS — E119 Type 2 diabetes mellitus without complications: Secondary | ICD-10-CM | POA: Diagnosis not present

## 2023-09-05 LAB — COMPREHENSIVE METABOLIC PANEL
ALT: 15 IU/L (ref 0–44)
AST: 17 IU/L (ref 0–40)
Albumin: 4.3 g/dL (ref 3.8–4.8)
Alkaline Phosphatase: 78 IU/L (ref 44–121)
BUN/Creatinine Ratio: 13 (ref 10–24)
BUN: 13 mg/dL (ref 8–27)
Bilirubin Total: 0.6 mg/dL (ref 0.0–1.2)
CO2: 22 mmol/L (ref 20–29)
Calcium: 9 mg/dL (ref 8.6–10.2)
Chloride: 106 mmol/L (ref 96–106)
Creatinine, Ser: 1 mg/dL (ref 0.76–1.27)
Globulin, Total: 1.9 g/dL (ref 1.5–4.5)
Glucose: 112 mg/dL — ABNORMAL HIGH (ref 70–99)
Potassium: 4.6 mmol/L (ref 3.5–5.2)
Sodium: 142 mmol/L (ref 134–144)
Total Protein: 6.2 g/dL (ref 6.0–8.5)
eGFR: 78 mL/min/{1.73_m2} (ref >59)

## 2023-09-05 LAB — CK: Total CK: 56 U/L (ref 41–331)

## 2023-09-05 LAB — LIPID PANEL
Chol/HDL Ratio: 2.1 ratio (ref 0.0–5.0)
Cholesterol, Total: 123 mg/dL (ref 100–199)
HDL: 59 mg/dL (ref >39)
LDL Chol Calc (NIH): 46 mg/dL (ref 0–99)
Triglycerides: 95 mg/dL (ref 0–149)
VLDL Cholesterol Cal: 18 mg/dL (ref 5–40)

## 2023-09-05 LAB — HEMOGLOBIN A1C
Est. average glucose Bld gHb Est-mCnc: 137 mg/dL
Hgb A1c MFr Bld: 6.4 % — ABNORMAL HIGH (ref 4.8–5.6)

## 2023-09-06 ENCOUNTER — Encounter: Payer: Self-pay | Admitting: Internal Medicine

## 2023-09-06 ENCOUNTER — Ambulatory Visit (INDEPENDENT_AMBULATORY_CARE_PROVIDER_SITE_OTHER): Payer: Medicare Other | Admitting: Internal Medicine

## 2023-09-06 VITALS — BP 114/76 | HR 68 | Ht 74.0 in | Wt 210.0 lb

## 2023-09-06 DIAGNOSIS — I10 Essential (primary) hypertension: Secondary | ICD-10-CM

## 2023-09-06 DIAGNOSIS — E119 Type 2 diabetes mellitus without complications: Secondary | ICD-10-CM | POA: Diagnosis not present

## 2023-09-06 DIAGNOSIS — F5101 Primary insomnia: Secondary | ICD-10-CM | POA: Diagnosis not present

## 2023-09-06 DIAGNOSIS — E782 Mixed hyperlipidemia: Secondary | ICD-10-CM

## 2023-09-06 LAB — POC CREATINE & ALBUMIN,URINE
Albumin/Creatinine Ratio, Urine, POC: >33
Creatinine, POC: 300 mg/dL
Microalbumin Ur, POC: 150 mg/L

## 2023-09-06 LAB — POCT CBG (FASTING - GLUCOSE)-MANUAL ENTRY: Glucose Fasting, POC: 124 mg/dL — AB (ref 70–99)

## 2023-09-06 MED ORDER — METFORMIN HCL 500 MG PO TABS
500.0000 mg | ORAL_TABLET | Freq: Two times a day (BID) | ORAL | 1 refills | Status: DC
Start: 2023-09-06 — End: 2024-04-10

## 2023-09-06 MED ORDER — ZOLPIDEM TARTRATE 10 MG PO TABS
10.0000 mg | ORAL_TABLET | Freq: Every day | ORAL | 2 refills | Status: DC
Start: 2023-09-06 — End: 2024-03-18

## 2023-09-06 MED ORDER — ROSUVASTATIN CALCIUM 40 MG PO TABS
40.0000 mg | ORAL_TABLET | Freq: Every day | ORAL | 0 refills | Status: DC
Start: 2023-09-06 — End: 2024-01-16

## 2023-09-06 MED ORDER — RYBELSUS 14 MG PO TABS: 1.0000 | ORAL_TABLET | Freq: Every day | ORAL | 0 refills | Status: DC

## 2023-09-06 NOTE — Progress Notes (Signed)
Established Patient Office Visit  Subjective:  Patient ID: Bruce Betsill., male    DOB: [birth year]  Age: 77 y.o. MRN: [id number]  Chief Complaint  Patient presents with   Follow-up    3 month follow up    No new complaints, here for lab review and medication refills. Labs reviewed and notable for well controlled diabetes, A1c improved and remains at target, lipids at target with unremarkable cmp. Denies any hypoglycemic episodes and home bg readings have been at target.   No other concerns at this time.   Past Medical History:  Diagnosis Date   Abnormal colonoscopy    polyps noted, benign, [facility], rec 10 year follow up   Alcoholism (HCC)    11 yrs ago    BPH (benign prostatic hyperplasia)    Diabetes mellitus    Diverticulosis    Gallstones    GERD (gastroesophageal reflux disease)    H/O hematuria    Hemorrhoids    Hyperlipidemia    Hypertension    Iron deficiency    Sleep apnea    Vitamin D deficiency     Past Surgical History:  Procedure Laterality Date   CARDIOVASCULAR STRESS TEST  02/12/2009   Negative/AT [facility] Dr Domenic Polite   COLONOSCOPY     CYSTOSCOPY  07/03/2012   RHINOPLASTY     soft palate reduction for apnea   Soft palate reduction for Sleep Apnea     [facility]   TONSILLECTOMY     UPPER GASTROINTESTINAL ENDOSCOPY     VASECTOMY      Social History   Socioeconomic History   Marital status: Married    Spouse name: Not on file   Number of children: 2   Years of education: Not on file   Highest education level: Not on file  Occupational History   Occupation: VP of HR    Employer: [employer]  Tobacco Use   Smoking status: Never   Smokeless tobacco: Never  Substance and Sexual Activity   Alcohol use: No    Comment: quit 2002   Drug use: No   Sexual activity: Not Currently  Other Topics Concern   Not on file  Social History Narrative   Lives in [place], [place] with wife.    2 daughters   No pets. Hobbies: fishing.   Engineer, manufacturing systems, [employer]   Regular Exercise -  NO   Daily Caffeine Use:  2 -4 coffee    No current alcohol history of alcoholism   No tobacco   Social Determinants of Corporate investment banker Strain: Not on file  Food Insecurity: Not on file  Transportation Needs: Not on file  Physical Activity: Not on file  Stress: Not on file  Social Connections: Not on file  Intimate Partner Violence: Not on file    Family History  Problem Relation Age of Onset   Breast cancer Mother    Heart disease Father        Heart Attack   Colon cancer Maternal Grandfather    Diabetes Paternal Grandfather    Heart disease Paternal Grandfather    Coronary artery disease Father    Nephrolithiasis Daughter     Allergies  Allergen Reactions   Ace Inhibitors     COUGH    Review of Systems  Constitutional: Negative.   HENT: Negative.    Eyes: Negative.   Respiratory: Negative.    Cardiovascular: Negative.   Gastrointestinal: Negative.   Genitourinary:  Nocturia  Musculoskeletal: Negative.   Skin: Negative.   Neurological: Negative.   Endo/Heme/Allergies: Negative.        Objective:   BP 114/76   Pulse 68   Ht 6\' 2"  (1.88 m)   Wt 210 lb (95.3 kg)   SpO2 96%   BMI 26.96 kg/m   Vitals:   09/06/23 0817  BP: 114/76  Pulse: 68  Height: 6\' 2"  (1.88 m)  Weight: 210 lb (95.3 kg)  SpO2: 96%  BMI (Calculated): 26.95    Physical Exam Vitals and nursing note reviewed.  Constitutional:      Appearance: Normal appearance. He is overweight.  HENT:     Head: Normocephalic.     Left Ear: There is no impacted cerumen.     Nose: Nose normal.     Mouth/Throat:     Mouth: Mucous membranes are moist.     Pharynx: No posterior oropharyngeal erythema.  Eyes:     Extraocular Movements: Extraocular movements intact.     Pupils: Pupils are equal, round, and reactive to light.  Cardiovascular:     Rate and Rhythm: Regular rhythm.     Chest Wall: PMI is not displaced.     Pulses:  Normal pulses.     Heart sounds: Normal heart sounds. No murmur heard. Pulmonary:     Effort: Pulmonary effort is normal.     Breath sounds: Normal air entry. No rhonchi or rales.  Abdominal:     General: Abdomen is flat. Bowel sounds are normal. There is no distension.     Palpations: Abdomen is soft. There is no hepatomegaly, splenomegaly or mass.     Tenderness: There is no abdominal tenderness.  Musculoskeletal:        General: Normal range of motion.     Cervical back: Normal range of motion and neck supple.     Right lower leg: No edema.     Left lower leg: No edema.  Skin:    General: Skin is warm and dry.  Neurological:     General: No focal deficit present.     Mental Status: He is alert and oriented to person, place, and time.     Cranial Nerves: No cranial nerve deficit.     Motor: Tremor present. No weakness.  Psychiatric:        Mood and Affect: Mood normal.        Behavior: Behavior normal.      Results for orders placed or performed in visit on 09/06/23  POCT CBG (Fasting - Glucose)  Result Value Ref Range   Glucose Fasting, POC 124 (A) 70 - 99 mg/dL    Recent Results (from the past 2160 hour(Vergene Marland))  CK     Status: None   Collection Time: 09/04/23  9:16 AM  Result Value Ref Range   Total CK 56 41 - 331 U/L  Comprehensive metabolic panel     Status: Abnormal   Collection Time: 09/04/23  9:16 AM  Result Value Ref Range   Glucose 112 (H) 70 - 99 mg/dL   BUN 13 8 - 27 mg/dL   Creatinine, Ser 1.61 0.76 - 1.27 mg/dL   eGFR 78 >09 UE/AVW/0.98   BUN/Creatinine Ratio 13 10 - 24   Sodium 142 134 - 144 mmol/L   Potassium 4.6 3.5 - 5.2 mmol/L   Chloride 106 96 - 106 mmol/L   CO2 22 20 - 29 mmol/L   Calcium 9.0 8.6 - 10.2 mg/dL   Total Protein  6.2 6.0 - 8.5 g/dL   Albumin 4.3 3.8 - 4.8 g/dL   Globulin, Total 1.9 1.5 - 4.5 g/dL   Bilirubin Total 0.6 0.0 - 1.2 mg/dL   Alkaline Phosphatase 78 44 - 121 IU/L   AST 17 0 - 40 IU/L   ALT 15 0 - 44 IU/L  Lipid panel      Status: None   Collection Time: 09/04/23  9:16 AM  Result Value Ref Range   Cholesterol, Total 123 100 - 199 mg/dL   Triglycerides 95 0 - 149 mg/dL   HDL 59 >46 mg/dL   VLDL Cholesterol Cal 18 5 - 40 mg/dL   LDL Chol Calc (NIH) 46 0 - 99 mg/dL   Chol/HDL Ratio 2.1 0.0 - 5.0 ratio    Comment:                                   T. Chol/HDL Ratio                                             Men  Women                               1/2 Avg.Risk  3.4    3.3                                   Avg.Risk  5.0    4.4                                2X Avg.Risk  9.6    7.1                                3X Avg.Risk 23.4   11.0   Hemoglobin A1c     Status: Abnormal   Collection Time: 09/04/23  9:16 AM  Result Value Ref Range   Hgb A1c MFr Bld 6.4 (H) 4.8 - 5.6 %    Comment:          Prediabetes: 5.7 - 6.4          Diabetes: >6.4          Glycemic control for adults with diabetes: <7.0    Est. average glucose Bld gHb Est-mCnc 137 mg/dL  POCT CBG (Fasting - Glucose)     Status: Abnormal   Collection Time: 09/06/23  8:28 AM  Result Value Ref Range   Glucose Fasting, POC 124 (A) 70 - 99 mg/dL      Assessment & Plan:  As per problem list  Problem List Items Addressed This Visit       Endocrine   Type 2 diabetes mellitus without complication, without long-term current use of insulin (HCC) - Primary   Relevant Orders   POCT CBG (Fasting - Glucose) (Completed)    No follow-ups on file.   Total time spent: 20 minutes  Luna Fuse, MD  09/06/2023   This document may have been prepared by [facility] Voice Recognition software and as such may include unintentional dictation errors.

## 2023-10-12 DIAGNOSIS — Z23 Encounter for immunization: Secondary | ICD-10-CM | POA: Diagnosis not present

## 2023-10-21 ENCOUNTER — Other Ambulatory Visit: Payer: Self-pay | Admitting: Internal Medicine

## 2023-11-22 ENCOUNTER — Ambulatory Visit: Payer: Medicare Other | Admitting: Urology

## 2023-11-22 ENCOUNTER — Encounter: Payer: Self-pay | Admitting: Urology

## 2023-11-22 VITALS — BP 123/82 | HR 90 | Ht 74.0 in | Wt 210.0 lb

## 2023-11-22 DIAGNOSIS — R399 Unspecified symptoms and signs involving the genitourinary system: Secondary | ICD-10-CM

## 2023-11-22 DIAGNOSIS — N401 Enlarged prostate with lower urinary tract symptoms: Secondary | ICD-10-CM | POA: Diagnosis not present

## 2023-11-22 LAB — MICROSCOPIC EXAMINATION: WBC, UA: >30 /[HPF] — AB (ref 0–5)

## 2023-11-22 LAB — URINALYSIS, COMPLETE
Bilirubin, UA: NEGATIVE
Glucose, UA: NEGATIVE
Ketones, UA: NEGATIVE
Nitrite, UA: NEGATIVE
Specific Gravity, UA: >1.03 — ABNORMAL HIGH (ref 1.005–1.030)
Urobilinogen, Ur: 1 mg/dL (ref 0.2–1.0)
pH, UA: 6 (ref 5.0–7.5)

## 2023-11-22 LAB — BLADDER SCAN AMB NON-IMAGING: Scan Result: 14

## 2023-11-22 MED ORDER — GEMTESA 75 MG PO TABS: 75.0000 mg | ORAL_TABLET | Freq: Every day | ORAL | Status: DC

## 2023-11-22 NOTE — Progress Notes (Signed)
I, Maysun Anabel Bene, acting as a scribe for Riki Altes, MD., have documented all relevant documentation on the behalf of Riki Altes, MD, as directed by Riki Altes, MD while in the presence of Riki Altes, MD.  11/22/2023 2:50 PM   Bruce Wyatt. 06/04/1946 [id number]  Referring provider: Sherron Monday, MD [address] [place],  [place] [address]  Chief Complaint  Patient presents with   Benign Prostatic Hypertrophy   Urologic history: 1. Lower urinary tract symptoms Multifactorial-BPH and Parkinson's. Both obstructive and storage-related voiding symptoms with IPSS 21/35 on initial presentation. Mild PVR elevation 146 initial presentation and 86 on 1 month follow-up.  HPI: Bruce Bota. is a 77 y.o. male presents for annual follow-up.   Stable symptoms since last year's visit. He has intermittent worsening of his symptoms.  No dysuria or gross hematuria.  No flank, abdominal, or pelvic pain.  He is not really sure if he saw improvement on titration of Myrbetriq from 25-50 mg.  He was taking silodosin every other day, however is presently taking it daily without orthostatic symptoms.   PMH: Past Medical History:  Diagnosis Date   Abnormal colonoscopy    polyps noted, benign, [facility], rec 10 year follow up   Alcoholism (HCC)    11 yrs ago    BPH (benign prostatic hyperplasia)    Diabetes mellitus    Diverticulosis    Gallstones    GERD (gastroesophageal reflux disease)    H/O hematuria    Hemorrhoids    Hyperlipidemia    Hypertension    Iron deficiency    Sleep apnea    Vitamin D deficiency     Surgical History: Past Surgical History:  Procedure Laterality Date   CARDIOVASCULAR STRESS TEST  02/12/2009   Negative/AT [facility] Dr Domenic Polite   COLONOSCOPY     CYSTOSCOPY  07/03/2012   RHINOPLASTY     soft palate reduction for apnea   Soft palate reduction for Sleep Apnea     [facility]   TONSILLECTOMY     UPPER  GASTROINTESTINAL ENDOSCOPY     VASECTOMY      Home Medications:  Allergies as of 11/22/2023       Reactions   Ace Inhibitors    COUGH        Medication List        Accurate as of November 22, 2023  2:50 PM. If you have any questions, ask your nurse or doctor.          STOP taking these medications    losartan-hydrochlorothiazide 50-12.5 MG tablet Commonly known as: HYZAAR Stopped by: Riki Altes   Myrbetriq 50 MG Tb24 tablet Generic drug: mirabegron ER Stopped by: Riki Altes       TAKE these medications    carbidopa-levodopa 25-100 MG tablet Commonly known as: SINEMET IR Take 2 tablets by mouth 3 (three) times daily.   cyanocobalamin 1000 MCG tablet Commonly known as: VITAMIN B12 Take 1,000 mcg by mouth daily.   Gemtesa 75 MG Tabs Generic drug: Vibegron Take 1 tablet (75 mg total) by mouth daily. Started by: Riki Altes   metFORMIN 500 MG tablet Commonly known as: GLUCOPHAGE Take 1 tablet (500 mg total) by mouth 2 (two) times daily.   multivitamin with minerals tablet Take 1 tablet by mouth daily.   mupirocin ointment 2 % Commonly known as: BACTROBAN Apply 1 Application topically as needed.   pantoprazole 40  MG tablet Commonly known as: PROTONIX TAKE 1 TABLET BY MOUTH TWICE DAILY   rosuvastatin 40 MG tablet Commonly known as: CRESTOR Take 1 tablet (40 mg total) by mouth daily.   Rybelsus 14 MG Tabs Generic drug: Semaglutide Take 1 tablet (14 mg total) by mouth daily.   silodosin 8 MG Caps capsule Commonly known as: RAPAFLO TAKE 1 CAPSULE BY MOUTH EVERY OTHER DAY   venlafaxine XR 75 MG 24 hr capsule Commonly known as: EFFEXOR-XR TAKE 1 CAPSULE BY MOUTH ONCE DAILY   zolpidem 10 MG tablet Commonly known as: AMBIEN Take 1 tablet (10 mg total) by mouth at bedtime.        Allergies:  Allergies  Allergen Reactions   Ace Inhibitors     COUGH    Family History: Family History  Problem Relation Age of Onset   Breast  cancer Mother    Heart disease Father        Heart Attack   Colon cancer Maternal Grandfather    Diabetes Paternal Grandfather    Heart disease Paternal Grandfather    Coronary artery disease Father    Nephrolithiasis Daughter     Social History:  reports that he has never smoked. He has never used smokeless tobacco. He reports that he does not drink alcohol and does not use drugs.   Physical Exam: BP 123/82   Pulse 90   Ht 6\' 2"  (1.88 m)   Wt 210 lb (95.3 kg)   BMI 26.96 kg/m   Constitutional:  Alert and oriented, No acute distress. HEENT: [place] AT Respiratory: Normal respiratory effort, no increased work of breathing. Psychiatric: Normal mood and affect.   Assessment & Plan:    1. Lower urinary tract symptoms Multifactorial with BPH and Parkinson's  PVR today 14 mL  Continue silodosin  DC Myrbetriq and he was given samples Gemtesa 75 mg daily; he will contact the office in 1 month to let us know the efficacy of Gemtesa.  1 year follow-up and instruct to call earlier for any significant change in his voiding pattern.   I have reviewed the above documentation for accuracy and completeness, and I agree with the above.   Riki Altes, MD  [facility] [address] [place], [place] [address] [phone]

## 2023-11-23 ENCOUNTER — Other Ambulatory Visit: Payer: Self-pay | Admitting: *Deleted

## 2023-11-23 DIAGNOSIS — R399 Unspecified symptoms and signs involving the genitourinary system: Secondary | ICD-10-CM

## 2023-12-06 ENCOUNTER — Ambulatory Visit: Payer: Medicare Other | Admitting: Internal Medicine

## 2023-12-08 ENCOUNTER — Ambulatory Visit (INDEPENDENT_AMBULATORY_CARE_PROVIDER_SITE_OTHER): Payer: Medicare Other | Admitting: Internal Medicine

## 2023-12-08 ENCOUNTER — Encounter: Payer: Self-pay | Admitting: Internal Medicine

## 2023-12-08 VITALS — BP 130/88 | HR 65 | Ht 74.0 in | Wt 214.6 lb

## 2023-12-08 DIAGNOSIS — R300 Dysuria: Secondary | ICD-10-CM | POA: Diagnosis not present

## 2023-12-08 DIAGNOSIS — N3000 Acute cystitis without hematuria: Secondary | ICD-10-CM

## 2023-12-08 DIAGNOSIS — E119 Type 2 diabetes mellitus without complications: Secondary | ICD-10-CM | POA: Diagnosis not present

## 2023-12-08 DIAGNOSIS — Z0130 Encounter for examination of blood pressure without abnormal findings: Secondary | ICD-10-CM

## 2023-12-08 LAB — POCT URINALYSIS DIPSTICK
Bilirubin, UA: NEGATIVE
Blood, UA: POSITIVE
Glucose, UA: NEGATIVE
Ketones, UA: NEGATIVE
Nitrite, UA: NEGATIVE
Protein, UA: POSITIVE — AB
Spec Grav, UA: 1.02 (ref 1.010–1.025)
Urobilinogen, UA: 0.2 U/dL
pH, UA: 6 (ref 5.0–8.0)

## 2023-12-08 LAB — POCT CBG (FASTING - GLUCOSE)-MANUAL ENTRY: Glucose Fasting, POC: 82 mg/dL (ref 70–99)

## 2023-12-08 MED ORDER — PHENAZOPYRIDINE HCL 100 MG PO TABS: 100.0000 mg | ORAL_TABLET | Freq: Three times a day (TID) | ORAL | 0 refills | Status: AC | PRN

## 2023-12-08 MED ORDER — CIPROFLOXACIN HCL 500 MG PO TABS: 500.0000 mg | ORAL_TABLET | Freq: Two times a day (BID) | ORAL | 0 refills | Status: AC

## 2023-12-08 NOTE — Progress Notes (Signed)
Established Patient Office Visit  Subjective:  Patient ID: Bruce Monterrosa., male    DOB: 1946/06/16  Age: 77 y.o. MRN: [id number]  Chief Complaint  Patient presents with  . Dysuria    Difficulty urinating and pain when urinating.    SUBJECTIVE: Bruce Carby. is a 77 y.o. male who complains of urinary frequency, urgency and dysuria x 5 days, suprapubic pain without flank pain, fever, chills, or abnormal vaginal discharge or bleeding.      No other concerns at this time.   Past Medical History:  Diagnosis Date  . Abnormal colonoscopy    polyps noted, benign, [facility], rec 10 year follow up  . Alcoholism (HCC)    11 yrs ago   . BPH (benign prostatic hyperplasia)   . Diabetes mellitus   . Diverticulosis   . Gallstones   . GERD (gastroesophageal reflux disease)   . H/O hematuria   . Hemorrhoids   . Hyperlipidemia   . Hypertension   . Iron deficiency   . Sleep apnea   . Vitamin D deficiency     Past Surgical History:  Procedure Laterality Date  . CARDIOVASCULAR STRESS TEST  02/12/2009   Negative/AT [facility] Dr Domenic Polite  . COLONOSCOPY    . CYSTOSCOPY  07/03/2012  . RHINOPLASTY     soft palate reduction for apnea  . Soft palate reduction for Sleep Apnea     [facility]  . TONSILLECTOMY    . UPPER GASTROINTESTINAL ENDOSCOPY    . VASECTOMY      Social History   Socioeconomic History  . Marital status: Married    Spouse name: Not on file  . Number of children: 2  . Years of education: Not on file  . Highest education level: Not on file  Occupational History  . Occupation: VP of HR    Employer: [employer]  Tobacco Use  . Smoking status: Never  . Smokeless tobacco: Never  Substance and Sexual Activity  . Alcohol use: No    Comment: quit 2002  . Drug use: No  . Sexual activity: Not Currently  Other Topics Concern  . Not on file  Social History Narrative   Lives in [place], [place] with wife.    2 daughters   No pets. Hobbies: fishing.    Textile management, [employer]   Regular Exercise -  NO   Daily Caffeine Use:  2 -4 coffee    No current alcohol history of alcoholism   No tobacco   Social Drivers of Corporate investment banker Strain: Not on file  Food Insecurity: Not on file  Transportation Needs: Not on file  Physical Activity: Not on file  Stress: Not on file  Social Connections: Not on file  Intimate Partner Violence: Not on file    Family History  Problem Relation Age of Onset  . Breast cancer Mother   . Heart disease Father        Heart Attack  . Colon cancer Maternal Grandfather   . Diabetes Paternal Grandfather   . Heart disease Paternal Grandfather   . Coronary artery disease Father   . Nephrolithiasis Daughter     Allergies  Allergen Reactions  . Ace Inhibitors     COUGH    Outpatient Medications Prior to Visit  Medication Sig  . carbidopa-levodopa (SINEMET IR) 25-100 MG tablet Take 2 tablets by mouth 3 (three) times daily.  . cyanocobalamin (VITAMIN B12) 1000 MCG tablet Take 1,000 mcg  by mouth daily.  . metFORMIN (GLUCOPHAGE) 500 MG tablet Take 1 tablet (500 mg total) by mouth 2 (two) times daily.  . Multiple Vitamins-Minerals (MULTIVITAMIN WITH MINERALS) tablet Take 1 tablet by mouth daily.  . mupirocin ointment (BACTROBAN) 2 % Apply 1 Application topically as needed.  . pantoprazole (PROTONIX) 40 MG tablet TAKE 1 TABLET BY MOUTH TWICE DAILY  . rosuvastatin (CRESTOR) 40 MG tablet Take 1 tablet (40 mg total) by mouth daily.  . Semaglutide (RYBELSUS) 14 MG TABS Take 1 tablet (14 mg total) by mouth daily.  . silodosin (RAPAFLO) 8 MG CAPS capsule TAKE 1 CAPSULE BY MOUTH EVERY OTHER DAY  . venlafaxine XR (EFFEXOR-XR) 75 MG 24 hr capsule TAKE 1 CAPSULE BY MOUTH ONCE DAILY  . Vibegron (GEMTESA) 75 MG TABS Take 1 tablet (75 mg total) by mouth daily.  Marland Kitchen zolpidem (AMBIEN) 10 MG tablet Take 1 tablet (10 mg total) by mouth at bedtime.   No facility-administered medications prior to visit.     Review of Systems  All other systems reviewed and are negative.      Objective:   BP 130/88   Pulse 65   Ht 6\' 2"  (1.88 m)   Wt 214 lb 9.6 oz (97.3 kg)   SpO2 96%   BMI 27.55 kg/m   Vitals:   12/08/23 1155  BP: 130/88  Pulse: 65  Height: 6\' 2"  (1.88 m)  Weight: 214 lb 9.6 oz (97.3 kg)  SpO2: 96%  BMI (Calculated): 27.54   OBJECTIVE: Appears well, in no apparent distress.  Vital signs are normal. The abdomen is soft with suprapubic  tenderness, no  guarding, mass, rebound or organomegaly. No CVA tenderness or inguinal adenopathy noted. Urine dipstick shows positive for WBC'Cal[place] Sachse, positive for protein, and positive for leukocytes.  Micro exam: not done.   Physical Exam Vitals reviewed.     Results for orders placed or performed in visit on 12/08/23  POCT CBG (Fasting - Glucose)  Result Value Ref Range   Glucose Fasting, POC 82 70 - 99 mg/dL  POCT Urinalysis Dipstick (81002)  Result Value Ref Range   Color, UA yellow    Clarity, UA cloudy    Glucose, UA Negative Negative   Bilirubin, UA neg    Ketones, UA neg    Spec Grav, UA 1.020 1.010 - 1.025   Blood, UA pos    pH, UA 6.0 5.0 - 8.0   Protein, UA Positive (A) Negative   Urobilinogen, UA 0.2 0.2 or 1.0 E.U./dL   Nitrite, UA neg    Leukocytes, UA Moderate (2+) (A) Negative   Appearance cloudy    Odor yes     Recent Results (from the past 2160 hours)  Bladder Scan (Post Void Residual) in office     Status: None   Collection Time: 11/22/23 10:32 AM  Result Value Ref Range   Scan Result 14   Urinalysis, Complete     Status: Abnormal   Collection Time: 11/22/23 10:45 AM  Result Value Ref Range   Specific Gravity, UA >1.030 (H) 1.005 - 1.030   pH, UA 6.0 5.0 - 7.5   Color, UA Amber (A) Yellow   Appearance Ur Cloudy (A) Clear   Leukocytes,UA 3+ (A) Negative   Protein,UA 3+ (A) Negative/Trace   Glucose, UA Negative Negative   Ketones, UA Negative Negative   RBC, UA 2+ (A) Negative   Bilirubin, UA  Negative Negative   Urobilinogen, Ur 1.0 0.2 - 1.0 mg/dL   Nitrite,  UA Negative Negative   Microscopic Examination See below:   Microscopic Examination     Status: Abnormal   Collection Time: 11/22/23 10:45 AM   Urine  Result Value Ref Range   WBC, UA >30 (A) 0 - 5 /hpf   RBC, Urine 11-30 (A) 0 - 2 /hpf   Epithelial Cells (non renal) 0-10 0 - 10 /hpf   Mucus, UA Present (A) Not Estab.   Bacteria, UA Many (A) None seen/Few  POCT CBG (Fasting - Glucose)     Status: None   Collection Time: 12/08/23 12:00 PM  Result Value Ref Range   Glucose Fasting, POC 82 70 - 99 mg/dL  POCT Urinalysis Dipstick (56213)     Status: Abnormal   Collection Time: 12/08/23 12:04 PM  Result Value Ref Range   Color, UA yellow    Clarity, UA cloudy    Glucose, UA Negative Negative   Bilirubin, UA neg    Ketones, UA neg    Spec Grav, UA 1.020 1.010 - 1.025   Blood, UA pos    pH, UA 6.0 5.0 - 8.0   Protein, UA Positive (A) Negative   Urobilinogen, UA 0.2 0.2 or 1.0 E.U./dL   Nitrite, UA neg    Leukocytes, UA Moderate (2+) (A) Negative   Appearance cloudy    Odor yes       Assessment & Plan:  As per problem list  ASSESSMENT: UTI uncomplicated without evidence of pyelonephritis  PLAN: Treatment per orders - also push fluids, may use Pyridium OTC prn. Call or return to clinic prn if these symptoms worsen or fail to improve as anticipated.  Problem List Items Addressed This Visit       Endocrine   Type 2 diabetes mellitus without complication, without long-term current use of insulin (HCC) - Primary   Relevant Orders   POCT CBG (Fasting - Glucose) (Completed)   Other Visit Diagnoses       Dysuria       Relevant Medications   phenazopyridine (PYRIDIUM) 100 MG tablet   Other Relevant Orders   POCT Urinalysis Dipstick (08657) (Completed)     Acute cystitis without hematuria       Relevant Medications   ciprofloxacin (CIPRO) 500 MG tablet       Return if symptoms worsen or fail to improve.    Total time spent: 30 minutes  Luna Fuse, MD  12/08/2023   This document may have been prepared by [facility] Voice Recognition software and as such may include unintentional dictation errors.

## 2023-12-21 ENCOUNTER — Other Ambulatory Visit: Payer: Self-pay | Admitting: Internal Medicine

## 2023-12-21 DIAGNOSIS — E119 Type 2 diabetes mellitus without complications: Secondary | ICD-10-CM

## 2023-12-22 ENCOUNTER — Telehealth: Payer: Self-pay

## 2023-12-22 ENCOUNTER — Other Ambulatory Visit: Payer: Medicare Other

## 2023-12-22 ENCOUNTER — Other Ambulatory Visit: Payer: Self-pay | Admitting: *Deleted

## 2023-12-22 ENCOUNTER — Other Ambulatory Visit: Payer: Self-pay | Admitting: Internal Medicine

## 2023-12-22 DIAGNOSIS — E782 Mixed hyperlipidemia: Secondary | ICD-10-CM | POA: Diagnosis not present

## 2023-12-22 DIAGNOSIS — E119 Type 2 diabetes mellitus without complications: Secondary | ICD-10-CM | POA: Diagnosis not present

## 2023-12-22 MED ORDER — GEMTESA 75 MG PO TABS
75.0000 mg | ORAL_TABLET | Freq: Every day | ORAL | 11 refills | Status: AC
Start: 2023-12-22 — End: ?

## 2023-12-22 NOTE — Telephone Encounter (Signed)
 Patient is requesting to get PSA added to his lab work

## 2023-12-23 LAB — LIPID PANEL
Chol/HDL Ratio: 1.9 {ratio} (ref 0.0–5.0)
Cholesterol, Total: 108 mg/dL (ref 100–199)
HDL: 56 mg/dL (ref >39)
LDL Chol Calc (NIH): 35 mg/dL (ref 0–99)
Triglycerides: 90 mg/dL (ref 0–149)
VLDL Cholesterol Cal: 17 mg/dL (ref 5–40)

## 2023-12-23 LAB — HEMOGLOBIN A1C
Est. average glucose Bld gHb Est-mCnc: 143 mg/dL
Hgb A1c MFr Bld: 6.6 % — ABNORMAL HIGH (ref 4.8–5.6)

## 2023-12-26 ENCOUNTER — Other Ambulatory Visit: Payer: Medicare Other

## 2023-12-26 DIAGNOSIS — R399 Unspecified symptoms and signs involving the genitourinary system: Secondary | ICD-10-CM | POA: Diagnosis not present

## 2023-12-26 LAB — URINALYSIS, COMPLETE
Bilirubin, UA: NEGATIVE
Glucose, UA: NEGATIVE
Nitrite, UA: NEGATIVE
Specific Gravity, UA: >1.03 — ABNORMAL HIGH (ref 1.005–1.030)
Urobilinogen, Ur: 1 mg/dL (ref 0.2–1.0)
pH, UA: 5.5 (ref 5.0–7.5)

## 2023-12-26 LAB — MICROSCOPIC EXAMINATION: WBC, UA: >30 /[HPF] — AB (ref 0–5)

## 2023-12-27 ENCOUNTER — Ambulatory Visit: Payer: Medicare Other | Admitting: Internal Medicine

## 2023-12-27 ENCOUNTER — Encounter: Payer: Self-pay | Admitting: Internal Medicine

## 2023-12-27 VITALS — BP 116/66 | HR 79 | Ht 74.0 in | Wt 205.8 lb

## 2023-12-27 DIAGNOSIS — R300 Dysuria: Secondary | ICD-10-CM | POA: Diagnosis not present

## 2023-12-27 DIAGNOSIS — I10 Essential (primary) hypertension: Secondary | ICD-10-CM

## 2023-12-27 DIAGNOSIS — E782 Mixed hyperlipidemia: Secondary | ICD-10-CM | POA: Diagnosis not present

## 2023-12-27 DIAGNOSIS — E119 Type 2 diabetes mellitus without complications: Secondary | ICD-10-CM

## 2023-12-27 LAB — POCT CBG (FASTING - GLUCOSE)-MANUAL ENTRY: Glucose Fasting, POC: 124 mg/dL — AB (ref 70–99)

## 2023-12-27 NOTE — Progress Notes (Signed)
 Established Patient Office Visit  Subjective:  Patient ID: Bruce Amores., male    DOB: 04/04/1946  Age: 78 y.o. MRN: [id number]  Chief Complaint  Patient presents with   Follow-up    3 month follow up, discuss lab results.    C/o recurrence of dysuria a few days after completing his antibiotics. Had a ua at his urologist yesterday which was negative for nitrates.Also here for lab review and medication refills. Labs reviewed and notable for well controlled diabetes, A1c at target although higher, lipids also at target. Denies any hypoglycemic episodes and home bg readings have been at target.     No other concerns at this time.   Past Medical History:  Diagnosis Date   Abnormal colonoscopy    polyps noted, benign, [facility], rec 10 year follow up   Alcoholism (HCC)    11 yrs ago    BPH (benign prostatic hyperplasia)    Diabetes mellitus    Diverticulosis    Gallstones    GERD (gastroesophageal reflux disease)    H/O hematuria    Hemorrhoids    Hyperlipidemia    Hypertension    Iron deficiency    Sleep apnea    Vitamin D  deficiency     Past Surgical History:  Procedure Laterality Date   CARDIOVASCULAR STRESS TEST  02/12/2009   Negative/AT [facility] Dr Lynwood Bare   COLONOSCOPY     CYSTOSCOPY  07/03/2012   RHINOPLASTY     soft palate reduction for apnea   [facility]   TONSILLECTOM[facility]     UPPER GASTROINTESTINAL ENDOSCOPY     VASECTOMY      Social History   Socioeconomic History   Marital status: Married    Spouse name: Not on file   Number of children: 2   Years of education: Not on file   Highest education level: Not on file  Occupational History   Occupation: VP of HR    Employer: [employer]  Tobacco Use   Smoking status: Never   Smokeless tobacco: Never  Substance and Sexual Activity   Alcohol use: No    Comment: quit 2002   Drug use: No   Sexual activity: Not Currently  Other Topics Concern   Not on file   Social History Narrative   Lives in [place], [place] with wife.    2 daughters   No pets. Hobbies: fishing.   Consulting civil engineer, [employer]   Regular Exercise -  NO   Daily Caffeine Use:  2 -4 coffee    No current alcohol history of alcoholism   No tobacco   Social Drivers of Corporate Investment Banker Strain: Not on file  Food Insecurity: Not on file  Transportation Needs: Not on file  Physical Activity: Not on file  Stress: Not on file  Social Connections: Not on file  Intimate Partner Violence: Not on file    Family History  Problem Relation Age of Onset   Breast cancer Mother    Heart disease Father        Heart Attack   Colon cancer Maternal Grandfather    Diabetes Paternal Grandfather    Heart disease Paternal Grandfather    Coronary artery disease Father    Nephrolithiasis Daughter     Allergies  Allergen Reactions   Ace Inhibitors     COUGH    Outpatient Medications Prior to Visit  Medication Sig   carbidopa -levodopa  (  SINEMET  IR) 25-100 MG tablet TAKE 2 TABLETS BY MOUTH THREE TIMES DAILY   cyanocobalamin  (VITAMIN B12) 1000 MCG tablet Take 1,000 mcg by mouth daily.   metFORMIN  (GLUCOPHAGE ) 500 MG tablet Take 1 tablet (500 mg total) by mouth 2 (two) times daily.   Multiple Vitamins-Minerals (MULTIVITAMIN WITH MINERALS) tablet Take 1 tablet by mouth daily.   pantoprazole  (PROTONIX ) 40 MG tablet TAKE 1 TABLET BY MOUTH TWICE DAILY   rosuvastatin  (CRESTOR ) 40 MG tablet Take 1 tablet (40 mg total) by mouth daily.   RYBELSUS  14 MG TABS TAKE 1 TABLET BY MOUTH DAILY   silodosin  (RAPAFLO ) 8 MG CAPS capsule TAKE 1 CAPSULE BY MOUTH EVERY OTHER DAY   venlafaxine  XR (EFFEXOR -XR) 75 MG 24 hr capsule TAKE 1 CAPSULE BY MOUTH ONCE DAILY   Vibegron  (GEMTESA ) 75 MG TABS Take 1 tablet (75 mg total) by mouth daily.   zolpidem  (AMBIEN ) 10 MG tablet Take 1 tablet (10 mg total) by mouth at bedtime.   mupirocin ointment (BACTROBAN) 2 % Apply 1 Application topically as needed.  (Patient not taking: Reported on 12/27/2023)   No facility-administered medications prior to visit.    Review of Systems  Constitutional: Negative.   HENT: Negative.    Eyes: Negative.   Respiratory: Negative.    Cardiovascular: Negative.   Gastrointestinal: Negative.   Genitourinary:  Positive for dysuria, frequency and urgency.       Nocturia  Musculoskeletal: Negative.   Skin: Negative.   Neurological: Negative.   Endo/Heme/Allergies: Negative.        Objective:   BP 116/66   Pulse 79   Ht 6' 2 (1.88 m)   Wt 205 lb 12.8 oz (93.4 kg)   SpO2 96%   BMI 26.42 kg/m   Vitals:   12/27/23 0832  BP: 116/66  Pulse: 79  Height: 6' 2 (1.88 m)  Weight: 205 lb 12.8 oz (93.4 kg)  SpO2: 96%  BMI (Calculated): 26.41    Physical Exam Vitals and nursing note reviewed.  Constitutional:      Appearance: Normal appearance. He is overweight.  HENT:     Head: Normocephalic.     Left Ear: There is no impacted cerumen.     Nose: Nose normal.     Mouth/Throat:     Mouth: Mucous membranes are moist.     Pharynx: No posterior oropharyngeal erythema.  Eyes:     Extraocular Movements: Extraocular movements intact.     Pupils: Pupils are equal, round, and reactive to light.  Cardiovascular:     Rate and Rhythm: Regular rhythm.     Chest Wall: PMI is not displaced.     Pulses: Normal pulses.     Heart sounds: Normal heart sounds. No murmur heard. Pulmonary:     Effort: Pulmonary effort is normal.     Breath sounds: Normal air entry. No rhonchi or rales.  Abdominal:     General: Abdomen is flat. Bowel sounds are normal. There is no distension.     Palpations: Abdomen is soft. There is no hepatomegaly, splenomegaly or mass.     Tenderness: There is no abdominal tenderness.  Musculoskeletal:        General: Normal range of motion.     Cervical back: Normal range of motion and neck supple.     Right lower leg: No edema.     Left lower leg: No edema.  Skin:    General: Skin is  warm and dry.  Neurological:     General:  No focal deficit present.     Mental Status: He is alert and oriented to person, place, and time.     Cranial Nerves: No cranial nerve deficit.     Motor: Tremor present. No weakness.  Psychiatric:        Mood and Affect: Mood normal.        Behavior: Behavior normal.      Results for orders placed or performed in visit on 12/27/23  POCT CBG (Fasting - Glucose)  Result Value Ref Range   Glucose Fasting, POC 124 (A) 70 - 99 mg/dL    Recent Results (from the past 2160 hours)  Bladder Scan (Post Void Residual) in office     Status: None   Collection Time: 11/22/23 10:32 AM  Result Value Ref Range   Scan Result 14   Urinalysis, Complete     Status: Abnormal   Collection Time: 11/22/23 10:45 AM  Result Value Ref Range   Specific Gravity, UA >1.030 (H) 1.005 - 1.030   pH, UA 6.0 5.0 - 7.5   Color, UA Amber (A) Yellow   Appearance Ur Cloudy (A) Clear   Leukocytes,UA 3+ (A) Negative   Protein,UA 3+ (A) Negative/Trace   Glucose, UA Negative Negative   Ketones, UA Negative Negative   RBC, UA 2+ (A) Negative   Bilirubin, UA Negative Negative   Urobilinogen, Ur 1.0 0.2 - 1.0 mg/dL   Nitrite, UA Negative Negative   Microscopic Examination See below:   Microscopic Examination     Status: Abnormal   Collection Time: 11/22/23 10:45 AM   Urine  Result Value Ref Range   WBC, UA >30 (A) 0 - 5 /hpf   RBC, Urine 11-30 (A) 0 - 2 /hpf   Epithelial Cells (non renal) 0-10 0 - 10 /hpf   Mucus, UA Present (A) Not Estab.   Bacteria, UA Many (A) None seen/Few  POCT CBG (Fasting - Glucose)     Status: None   Collection Time: 12/08/23 12:00 PM  Result Value Ref Range   Glucose Fasting, POC 82 70 - 99 mg/dL  POCT Urinalysis Dipstick (18997)     Status: Abnormal   Collection Time: 12/08/23 12:04 PM  Result Value Ref Range   Color, UA yellow    Clarity, UA cloudy    Glucose, UA Negative Negative   Bilirubin, UA neg    Ketones, UA neg    Spec Grav,  UA 1.020 1.010 - 1.025   Blood, UA pos    pH, UA 6.0 5.0 - 8.0   Protein, UA Positive (A) Negative   Urobilinogen, UA 0.2 0.2 or 1.0 E.U./dL   Nitrite, UA neg    Leukocytes, UA Moderate (2+) (A) Negative   Appearance cloudy    Odor yes   Lipid panel     Status: None   Collection Time: 12/22/23 12:00 PM  Result Value Ref Range   Cholesterol, Total 108 100 - 199 mg/dL   Triglycerides 90 0 - 149 mg/dL   HDL 56 >60 mg/dL   VLDL Cholesterol Cal 17 5 - 40 mg/dL   LDL Chol Calc (NIH) 35 0 - 99 mg/dL   Chol/HDL Ratio 1.9 0.0 - 5.0 ratio    Comment:                                   T. Chol/HDL Ratio  Men  Women                               1/2 Avg.Risk  3.4    3.3                                   Avg.Risk  5.0    4.4                                2X Avg.Risk  9.6    7.1                                3X Avg.Risk 23.4   11.0   Hemoglobin A1c     Status: Abnormal   Collection Time: 12/22/23 12:05 PM  Result Value Ref Range   Hgb A1c MFr Bld 6.6 (H) 4.8 - 5.6 %    Comment:          Prediabetes: 5.7 - 6.4          Diabetes: >6.4          Glycemic control for adults with diabetes: <7.0    Est. average glucose Bld gHb Est-mCnc 143 mg/dL  Urinalysis, Complete     Status: Abnormal   Collection Time: 12/26/23  9:38 AM  Result Value Ref Range   Specific Gravity, UA >1.030 (H) 1.005 - 1.030   pH, UA 5.5 5.0 - 7.5   Color, UA Yellow Yellow   Appearance Ur Cloudy (A) Clear   Leukocytes,UA 3+ (A) Negative   Protein,UA 3+ (A) Negative/Trace   Glucose, UA Negative Negative   Ketones, UA Trace (A) Negative   RBC, UA 2+ (A) Negative   Bilirubin, UA Negative Negative   Urobilinogen, Ur 1.0 0.2 - 1.0 mg/dL   Nitrite, UA Negative Negative   Microscopic Examination See below:   Microscopic Examination     Status: Abnormal   Collection Time: 12/26/23  9:38 AM   Urine  Result Value Ref Range   WBC, UA >30 (A) 0 - 5 /hpf   RBC, Urine 3-10 (A) 0 - 2  /hpf   Epithelial Cells (non renal) 0-10 0 - 10 /hpf   Bacteria, UA Moderate (A) None seen/Few  POCT CBG (Fasting - Glucose)     Status: Abnormal   Collection Time: 12/27/23  8:36 AM  Result Value Ref Range   Glucose Fasting, POC 124 (A) 70 - 99 mg/dL      Assessment & Plan:  As per problem list  Problem List Items Addressed This Visit       Cardiovascular and Mediastinum   Hypertension     Endocrine   Type 2 diabetes mellitus without complication, without long-term current use of insulin  (HCC) - Primary   Relevant Orders   POCT CBG (Fasting - Glucose) (Completed)     Other   Hyperlipidemia   Other Visit Diagnoses       Dysuria           Return in about 3 months (around 03/26/2024) for awv with labs prior.   Total time spent: 20 minutes  Sherrill Cinderella Perry, MD  12/27/2023   This document may have been prepared by [facility] Voice Recognition software and as such may  include unintentional dictation errors.

## 2023-12-29 LAB — CULTURE, URINE COMPREHENSIVE

## 2023-12-30 LAB — URINE CULTURE

## 2024-01-02 ENCOUNTER — Other Ambulatory Visit: Payer: Self-pay | Admitting: Internal Medicine

## 2024-01-02 DIAGNOSIS — R300 Dysuria: Secondary | ICD-10-CM

## 2024-01-02 MED ORDER — NITROFURANTOIN MONOHYD MACRO 100 MG PO CAPS: 100.0000 mg | ORAL_CAPSULE | Freq: Two times a day (BID) | ORAL | 0 refills | Status: AC

## 2024-01-03 ENCOUNTER — Encounter: Payer: Self-pay | Admitting: Internal Medicine

## 2024-01-16 ENCOUNTER — Other Ambulatory Visit: Payer: Self-pay | Admitting: Internal Medicine

## 2024-01-16 DIAGNOSIS — E119 Type 2 diabetes mellitus without complications: Secondary | ICD-10-CM

## 2024-01-16 DIAGNOSIS — E782 Mixed hyperlipidemia: Secondary | ICD-10-CM

## 2024-01-26 ENCOUNTER — Other Ambulatory Visit: Payer: Self-pay | Admitting: Internal Medicine

## 2024-03-11 ENCOUNTER — Other Ambulatory Visit: Payer: Self-pay

## 2024-03-11 MED ORDER — SILODOSIN 8 MG PO CAPS: 8.0000 mg | ORAL_CAPSULE | Freq: Every day | ORAL | 1 refills | Status: DC

## 2024-03-18 ENCOUNTER — Other Ambulatory Visit: Payer: Self-pay | Admitting: Internal Medicine

## 2024-03-18 DIAGNOSIS — E119 Type 2 diabetes mellitus without complications: Secondary | ICD-10-CM

## 2024-03-18 DIAGNOSIS — F5101 Primary insomnia: Secondary | ICD-10-CM

## 2024-03-18 DIAGNOSIS — I10 Essential (primary) hypertension: Secondary | ICD-10-CM

## 2024-03-25 ENCOUNTER — Other Ambulatory Visit: Payer: Self-pay | Admitting: Internal Medicine

## 2024-03-25 ENCOUNTER — Other Ambulatory Visit

## 2024-03-25 DIAGNOSIS — E782 Mixed hyperlipidemia: Secondary | ICD-10-CM | POA: Diagnosis not present

## 2024-03-25 DIAGNOSIS — E119 Type 2 diabetes mellitus without complications: Secondary | ICD-10-CM | POA: Diagnosis not present

## 2024-03-25 DIAGNOSIS — Z139 Encounter for screening, unspecified: Secondary | ICD-10-CM | POA: Diagnosis not present

## 2024-03-26 LAB — LIPID PANEL
Chol/HDL Ratio: 2.1 ratio (ref 0.0–5.0)
Cholesterol, Total: 115 mg/dL (ref 100–199)
HDL: 56 mg/dL (ref >39)
LDL Chol Calc (NIH): 38 mg/dL (ref 0–99)
Triglycerides: 116 mg/dL (ref 0–149)
VLDL Cholesterol Cal: 21 mg/dL (ref 5–40)

## 2024-03-26 LAB — HEMOGLOBIN A1C
Est. average glucose Bld gHb Est-mCnc: 140 mg/dL
Hgb A1c MFr Bld: 6.5 % — ABNORMAL HIGH (ref 4.8–5.6)

## 2024-03-26 LAB — PSA: Prostate Specific Ag, Serum: 2 ng/mL (ref 0.0–4.0)

## 2024-03-27 ENCOUNTER — Ambulatory Visit: Payer: Medicare Other | Admitting: Internal Medicine

## 2024-04-09 DIAGNOSIS — E119 Type 2 diabetes mellitus without complications: Secondary | ICD-10-CM | POA: Diagnosis not present

## 2024-04-09 DIAGNOSIS — H02883 Meibomian gland dysfunction of right eye, unspecified eyelid: Secondary | ICD-10-CM | POA: Diagnosis not present

## 2024-04-09 DIAGNOSIS — H0288A Meibomian gland dysfunction right eye, upper and lower eyelids: Secondary | ICD-10-CM | POA: Diagnosis not present

## 2024-04-09 DIAGNOSIS — D3131 Benign neoplasm of right choroid: Secondary | ICD-10-CM | POA: Diagnosis not present

## 2024-04-09 DIAGNOSIS — H0288B Meibomian gland dysfunction left eye, upper and lower eyelids: Secondary | ICD-10-CM | POA: Diagnosis not present

## 2024-04-09 DIAGNOSIS — H43393 Other vitreous opacities, bilateral: Secondary | ICD-10-CM | POA: Diagnosis not present

## 2024-04-09 DIAGNOSIS — H02886 Meibomian gland dysfunction of left eye, unspecified eyelid: Secondary | ICD-10-CM | POA: Diagnosis not present

## 2024-04-09 DIAGNOSIS — Z961 Presence of intraocular lens: Secondary | ICD-10-CM | POA: Diagnosis not present

## 2024-04-10 ENCOUNTER — Encounter: Payer: Self-pay | Admitting: Internal Medicine

## 2024-04-10 ENCOUNTER — Ambulatory Visit (INDEPENDENT_AMBULATORY_CARE_PROVIDER_SITE_OTHER): Payer: Medicare Other | Admitting: Internal Medicine

## 2024-04-10 VITALS — BP 110/68 | HR 73 | Temp 97.0°F | Ht 74.0 in | Wt 205.6 lb

## 2024-04-10 DIAGNOSIS — N400 Enlarged prostate without lower urinary tract symptoms: Secondary | ICD-10-CM

## 2024-04-10 DIAGNOSIS — E782 Mixed hyperlipidemia: Secondary | ICD-10-CM

## 2024-04-10 DIAGNOSIS — Z0130 Encounter for examination of blood pressure without abnormal findings: Secondary | ICD-10-CM

## 2024-04-10 DIAGNOSIS — E119 Type 2 diabetes mellitus without complications: Secondary | ICD-10-CM

## 2024-04-10 LAB — GLUCOSE, POCT (MANUAL RESULT ENTRY): POC Glucose: 141 mg/dL — AB (ref 70–99)

## 2024-04-10 MED ORDER — ROSUVASTATIN CALCIUM 40 MG PO TABS: 40.0000 mg | ORAL_TABLET | Freq: Every day | ORAL | 0 refills | Status: DC

## 2024-04-10 MED ORDER — RYBELSUS 14 MG PO TABS: 1.0000 | ORAL_TABLET | Freq: Every day | ORAL | 0 refills | Status: DC

## 2024-04-10 MED ORDER — METFORMIN HCL 500 MG PO TABS: 500.0000 mg | ORAL_TABLET | Freq: Two times a day (BID) | ORAL | 1 refills | Status: DC

## 2024-04-10 NOTE — Progress Notes (Signed)
 Established Patient Office Visit  Subjective:  [name], male    DOB: Oct 25, 1946  Age: 78 y.o. MRN: [id number]  Chief Complaint  Patient presents with   Follow-up    3 month    No new complaints, here for lab review and medication refills. Labs reviewed and notable for well controlled diabetes, A1c at target and lipids also at target. Denies any hypoglycemic episodes and doesn't check home bg.     No other concerns at this time.   Past Medical History:  Diagnosis Date   Abnormal colonoscopy    polyps noted, benign, [facility], rec 10 year follow up   Alcoholism (HCC)    11 yrs ago    BPH (benign prostatic hyperplasia)    Diabetes mellitus    Diverticulosis    Gallstones    GERD (gastroesophageal reflux disease)    H/O hematuria    Hemorrhoids    Hyperlipidemia    Hypertension    Iron deficiency    Sleep apnea    Vitamin D deficiency     Past Surgical History:  Procedure Laterality Date   CARDIOVASCULAR STRESS TEST  02/12/2009   Negative/AT [facility] Dr Townsend Freud   COLONOSCOPY     CYSTOSCOPY  07/03/2012   RHINOPLASTY     soft palate reduction for apnea   Soft palate reduction for Sleep Apnea     [facility]   TONSILLECTOMY     UPPER GASTROINTESTINAL ENDOSCOPY     VASECTOMY      Social History   Socioeconomic History   Marital status: Married    Spouse name: Not on file   Number of children: 2   Years of education: Not on file   Highest education level: Not on file  Occupational History   Occupation: VP of HR    Employer: [employer]  Tobacco Use   Smoking status: Never   Smokeless tobacco: Never  Substance and Sexual Activity   Alcohol use: No    Comment: quit 2002   Drug use: No   Sexual activity: Not Currently  Other Topics Concern   Not on file  Social History Narrative   Lives in [place], [place] with wife.    2 daughters   No pets. Hobbies: fishing.   Consulting civil engineer, [employer]   Regular Exercise -  NO   Daily  Caffeine Use:  2 -4 coffee    No current alcohol history of alcoholism   No tobacco   Social Drivers of Corporate investment banker Strain: Not on file  Food Insecurity: Not on file  Transportation Needs: Not on file  Physical Activity: Not on file  Stress: Not on file  Social Connections: Not on file  Intimate Partner Violence: Not on file    Family History  Problem Relation Age of Onset   Breast cancer Mother    Heart disease Father        Heart Attack   Colon cancer Maternal Grandfather    Diabetes Paternal Grandfather    Heart disease Paternal Grandfather    Coronary artery disease Father    Nephrolithiasis Daughter     Allergies  Allergen Reactions   Ace Inhibitors     COUGH    Outpatient Medications Prior to Visit  Medication Sig   carbidopa-levodopa (SINEMET IR) 25-100 MG tablet TAKE 2 TABLETS BY MOUTH THREE TIMES DAILY   cyanocobalamin  (VITAMIN B12) 1000 MCG tablet Take 1,000 mcg by mouth daily.  Multiple Vitamins-Minerals (MULTIVITAMIN WITH MINERALS) tablet Take 1 tablet by mouth daily.   pantoprazole (PROTONIX) 40 MG tablet TAKE 1 TABLET BY MOUTH TWICE DAILY   silodosin  (RAPAFLO ) 8 MG CAPS capsule Take 1 capsule (8 mg total) by mouth daily with breakfast.   venlafaxine  XR (EFFEXOR -XR) 75 MG 24 hr capsule TAKE 1 CAPSULE BY MOUTH ONCE DAILY   Vibegron  (GEMTESA ) 75 MG TABS Take 1 tablet (75 mg total) by mouth daily.   zolpidem  (AMBIEN ) 10 MG tablet TAKE ONE TABLET BY MOUTH AT BEDTIME   [DISCONTINUED] metFORMIN  (GLUCOPHAGE ) 500 MG tablet Take 1 tablet (500 mg total) by mouth 2 (two) times daily.   [DISCONTINUED] rosuvastatin  (CRESTOR ) 40 MG tablet TAKE ONE TABLET BY MOUTH EVERY DAY   [DISCONTINUED] RYBELSUS  14 MG TABS TAKE 1 TABLET BY MOUTH DAILY   mupirocin ointment (BACTROBAN) 2 % Apply 1 Application topically as needed. (Patient not taking: Reported on 04/10/2024)   No facility-administered medications prior to visit.    Review of Systems  Constitutional:  Negative.   HENT: Negative.    Eyes: Negative.   Respiratory: Negative.    Cardiovascular: Negative.   Gastrointestinal: Negative.   Genitourinary:  Positive for dysuria, frequency and urgency.       Nocturia  Musculoskeletal: Negative.   Skin: Negative.   Neurological: Negative.   Endo/Heme/Allergies: Negative.        Objective:   BP 110/68   Pulse 73   Temp (!) 97 F (36.1 C)   Ht 6\' 2"  (1.88 m)   Wt 205 lb 9.6 oz (93.3 kg)   SpO2 96%   BMI 26.40 kg/m   Vitals:   04/10/24 0846  BP: 110/68  Pulse: 73  Temp: (!) 97 F (36.1 C)  Height: 6\' 2"  (1.88 m)  Weight: 205 lb 9.6 oz (93.3 kg)  SpO2: 96%  BMI (Calculated): 26.39    Physical Exam Vitals and nursing note reviewed.  Constitutional:      Appearance: Normal appearance. He is overweight.  HENT:     Head: Normocephalic.     Left Ear: There is no impacted cerumen.     Nose: Nose normal.     Mouth/Throat:     Mouth: Mucous membranes are moist.     Pharynx: No posterior oropharyngeal erythema.  Eyes:     Extraocular Movements: Extraocular movements intact.     Pupils: Pupils are equal, round, and reactive to light.  Cardiovascular:     Rate and Rhythm: Regular rhythm.     Chest Wall: PMI is not displaced.     Pulses: Normal pulses.     Heart sounds: Normal heart sounds. No murmur heard. Pulmonary:     Effort: Pulmonary effort is normal.     Breath sounds: Normal air entry. No rhonchi or rales.  Abdominal:     General: Abdomen is flat. Bowel sounds are normal. There is no distension.     Palpations: Abdomen is soft. There is no hepatomegaly, splenomegaly or mass.     Tenderness: There is no abdominal tenderness.  Musculoskeletal:        General: Normal range of motion.     Cervical back: Normal range of motion and neck supple.     Right lower leg: No edema.     Left lower leg: No edema.  Skin:    General: Skin is warm and dry.  Neurological:     General: No focal deficit present.     Mental  Status: He is alert  and oriented to person, place, and time.     Cranial Nerves: No cranial nerve deficit.     Motor: Tremor present. No weakness.  Psychiatric:        Mood and Affect: Mood normal.        Behavior: Behavior normal.      Results for orders placed or performed in visit on 04/10/24  POCT Glucose (CBG)  Result Value Ref Range   POC Glucose 141 (A) 70 - 99 mg/dl    Recent Results (from the past 2160 hours)  Lipid panel     Status: None   Collection Time: 03/25/24  8:25 AM  Result Value Ref Range   Cholesterol, Total 115 100 - 199 mg/dL   Triglycerides 098 0 - 149 mg/dL   HDL 56 >11 mg/dL   VLDL Cholesterol Cal 21 5 - 40 mg/dL   LDL Chol Calc (NIH) 38 0 - 99 mg/dL   Chol/HDL Ratio 2.1 0.0 - 5.0 ratio    Comment:                                   T. Chol/HDL Ratio                                             Men  Women                               1/2 Avg.Risk  3.4    3.3                                   Avg.Risk  5.0    4.4                                2X Avg.Risk  9.6    7.1                                3X Avg.Risk 23.4   11.0   Hemoglobin A1c     Status: Abnormal   Collection Time: 03/25/24  8:25 AM  Result Value Ref Range   Hgb A1c MFr Bld 6.5 (H) 4.8 - 5.6 %    Comment:          Prediabetes: 5.7 - 6.4          Diabetes: >6.4          Glycemic control for adults with diabetes: <7.0    Est. average glucose Bld gHb Est-mCnc 140 mg/dL  PSA     Status: None   Collection Time: 03/25/24  8:28 AM  Result Value Ref Range   Prostate Specific Ag, Serum 2.0 0.0 - 4.0 ng/mL    Comment: Roche ECLIA methodology. According to the American Urological Association, Serum PSA should decrease and remain at undetectable levels after radical prostatectomy. The AUA defines biochemical recurrence as an initial PSA value 0.2 ng/mL or greater followed by a subsequent confirmatory PSA value 0.2 ng/mL or greater. Values obtained with different assay methods or kits cannot  be used interchangeably. Results cannot be  interpreted as absolute evidence of the presence or absence of malignant disease.   POCT Glucose (CBG)     Status: Abnormal   Collection Time: 04/10/24  8:50 AM  Result Value Ref Range   POC Glucose 141 (A) 70 - 99 mg/dl      Assessment & Plan:  As per problem list. The current medical regimen is effective;  continue present plan and medications.   Problem List Items Addressed This Visit       Endocrine   Type 2 diabetes mellitus without complication, without long-term current use of insulin (HCC) - Primary   Relevant Medications   rosuvastatin  (CRESTOR ) 40 MG tablet   Semaglutide  (RYBELSUS ) 14 MG TABS   metFORMIN  (GLUCOPHAGE ) 500 MG tablet   Other Relevant Orders   POCT Glucose (CBG) (Completed)     Other   Hyperlipidemia   Relevant Medications   rosuvastatin  (CRESTOR ) 40 MG tablet   Enlarged prostate   Relevant Orders   PSA    Return in about 3 months (around 07/10/2024) for awv with labs prior.   Total time spent: 20 minutes  Arzella Bitters, MD  04/10/2024   This document may have been prepared by [facility] Voice Recognition software and as such may include unintentional dictation errors.

## 2024-04-16 ENCOUNTER — Other Ambulatory Visit: Payer: Self-pay

## 2024-04-16 DIAGNOSIS — I10 Essential (primary) hypertension: Secondary | ICD-10-CM

## 2024-04-16 DIAGNOSIS — E119 Type 2 diabetes mellitus without complications: Secondary | ICD-10-CM

## 2024-04-16 DIAGNOSIS — F5101 Primary insomnia: Secondary | ICD-10-CM

## 2024-04-16 MED ORDER — ZOLPIDEM TARTRATE 10 MG PO TABS: 10.0000 mg | ORAL_TABLET | Freq: Every day | ORAL | 2 refills | Status: DC

## 2024-04-23 ENCOUNTER — Other Ambulatory Visit: Payer: Self-pay | Admitting: Internal Medicine

## 2024-06-14 ENCOUNTER — Other Ambulatory Visit: Payer: Self-pay | Admitting: Internal Medicine

## 2024-07-04 ENCOUNTER — Other Ambulatory Visit

## 2024-07-04 ENCOUNTER — Other Ambulatory Visit: Payer: Self-pay | Admitting: Internal Medicine

## 2024-07-04 DIAGNOSIS — E782 Mixed hyperlipidemia: Secondary | ICD-10-CM | POA: Diagnosis not present

## 2024-07-04 DIAGNOSIS — E119 Type 2 diabetes mellitus without complications: Secondary | ICD-10-CM | POA: Diagnosis not present

## 2024-07-04 DIAGNOSIS — N400 Enlarged prostate without lower urinary tract symptoms: Secondary | ICD-10-CM | POA: Diagnosis not present

## 2024-07-05 LAB — LIPID PANEL
Chol/HDL Ratio: 2 ratio (ref 0.0–5.0)
Cholesterol, Total: 120 mg/dL (ref 100–199)
HDL: 61 mg/dL (ref >39)
LDL Chol Calc (NIH): 42 mg/dL (ref 0–99)
Triglycerides: 87 mg/dL (ref 0–149)
VLDL Cholesterol Cal: 17 mg/dL (ref 5–40)

## 2024-07-05 LAB — HEMOGLOBIN A1C
Est. average glucose Bld gHb Est-mCnc: 131 mg/dL
Hgb A1c MFr Bld: 6.2 % — ABNORMAL HIGH (ref 4.8–5.6)

## 2024-07-05 LAB — PSA: Prostate Specific Ag, Serum: 1.8 ng/mL (ref 0.0–4.0)

## 2024-07-08 ENCOUNTER — Other Ambulatory Visit: Payer: Self-pay | Admitting: Internal Medicine

## 2024-07-08 DIAGNOSIS — E782 Mixed hyperlipidemia: Secondary | ICD-10-CM

## 2024-07-10 ENCOUNTER — Ambulatory Visit: Payer: Self-pay | Admitting: Internal Medicine

## 2024-07-10 ENCOUNTER — Ambulatory Visit (INDEPENDENT_AMBULATORY_CARE_PROVIDER_SITE_OTHER): Admitting: Internal Medicine

## 2024-07-10 ENCOUNTER — Encounter: Payer: Self-pay | Admitting: Internal Medicine

## 2024-07-10 VITALS — BP 119/78 | HR 57 | Temp 97.5°F | Ht 74.0 in | Wt 205.4 lb

## 2024-07-10 DIAGNOSIS — E119 Type 2 diabetes mellitus without complications: Secondary | ICD-10-CM

## 2024-07-10 DIAGNOSIS — Z0001 Encounter for general adult medical examination with abnormal findings: Secondary | ICD-10-CM

## 2024-07-10 DIAGNOSIS — Z1331 Encounter for screening for depression: Secondary | ICD-10-CM | POA: Diagnosis not present

## 2024-07-10 DIAGNOSIS — F5101 Primary insomnia: Secondary | ICD-10-CM | POA: Diagnosis not present

## 2024-07-10 DIAGNOSIS — I10 Essential (primary) hypertension: Secondary | ICD-10-CM | POA: Diagnosis not present

## 2024-07-10 LAB — POCT CBG (FASTING - GLUCOSE)-MANUAL ENTRY: Glucose Fasting, POC: 110 mg/dL — AB (ref 70–99)

## 2024-07-10 MED ORDER — RYBELSUS 14 MG PO TABS
1.0000 | ORAL_TABLET | Freq: Every day | ORAL | 0 refills | Status: DC
Start: 2024-07-10 — End: 2024-10-22

## 2024-07-10 MED ORDER — ZOLPIDEM TARTRATE 10 MG PO TABS: 10.0000 mg | ORAL_TABLET | Freq: Every day | ORAL | 2 refills | Status: DC

## 2024-07-10 NOTE — Progress Notes (Signed)
 Established Patient Office Visit  Subjective:  [name], male    DOB: 04/22/1946  Age: 78 y.o. MRN: [id number]  Chief Complaint  Patient presents with   Annual Exam    No new complaints, here for AWV refer to quality metrics and scanned documents. 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  No new complaints, here for lab review and medication refills. Labs reviewed and notable for well controlled diabetes, A1c improved and at target, lipids at target with normal psa.     No other concerns at this time.   Past Medical History:  Diagnosis Date   Abnormal colonoscopy    polyps noted, benign, [facility], rec 10 year follow up   Alcoholism (HCC)    11 yrs ago    BPH (benign prostatic hyperplasia)    Diabetes mellitus    Diverticulosis    Gallstones    GERD (gastroesophageal reflux disease)    H/O hematuria    Hemorrhoids    Hyperlipidemia    Hypertension    Iron deficiency    Sleep apnea    Vitamin D deficiency     Past Surgical History:  Procedure Laterality Date   CARDIOVASCULAR STRESS TEST  02/12/2009   Negative/AT [facility] Dr Lynwood Bare   COLONOSCOPY     CYSTOSCOPY  07/03/2012   RHINOPLASTY     soft palate reduction for apnea   Soft palate reduction for Sleep Apnea     [facility]   TONSILLECTOMY     UPPER GASTROINTESTINAL ENDOSCOPY     VASECTOMY      Social History   Socioeconomic History   Marital status: Married    Spouse name: Not on file   Number of children: 2   Years of education: Not on file   Highest education level: Not on file  Occupational History   Occupation: VP of HR    Employer: [employer]  Tobacco Use   Smoking status: Never   Smokeless tobacco: Never  Substance and Sexual Activity   Alcohol use: No    Comment: quit 2002   Drug use: No   Sexual activity: Not Currently  Other Topics Concern   Not on file  Social History Narrative   Lives in [place], [place] with wife.    2 daughters   No pets.  Hobbies: fishing.   Consulting civil engineer, [employer]   Regular Exercise -  NO   Daily Caffeine Use:  2 -4 coffee    No current alcohol history of alcoholism   No tobacco   Social Drivers of Corporate investment banker Strain: Not on file  Food Insecurity: Not on file  Transportation Needs: Not on file  Physical Activity: Not on file  Stress: Not on file  Social Connections: Not on file  Intimate Partner Violence: Not on file    Family History  Problem Relation Age of Onset   Breast cancer Mother    Heart disease Father        Heart Attack   Colon cancer Maternal Grandfather    Diabetes Paternal Grandfather    Heart disease Paternal Grandfather    Coronary artery disease Father    Nephrolithiasis Daughter     Allergies  Allergen Reactions   Ace Inhibitors     COUGH    Outpatient Medications Prior to Visit  Medication Sig   carbidopa-levodopa (SINEMET IR) 25-100 MG tablet TAKE 2 TABLETS BY MOUTH THREE TIMES DAILY   cyanocobalamin  (VITAMIN B12)  1000 MCG tablet Take 1,000 mcg by mouth daily.   metFORMIN  (GLUCOPHAGE ) 500 MG tablet Take 1 tablet (500 mg total) by mouth 2 (two) times daily.   Multiple Vitamins-Minerals (MULTIVITAMIN WITH MINERALS) tablet Take 1 tablet by mouth daily.   mupirocin ointment (BACTROBAN) 2 % Apply 1 Application topically as needed.   pantoprazole (PROTONIX) 40 MG tablet TAKE 1 TABLET BY MOUTH TWICE DAILY   rosuvastatin  (CRESTOR ) 40 MG tablet TAKE 1 TABLET BY MOUTH DAILY   silodosin  (RAPAFLO ) 8 MG CAPS capsule Take 1 capsule (8 mg total) by mouth daily with breakfast.   venlafaxine  XR (EFFEXOR -XR) 75 MG 24 hr capsule TAKE 1 CAPSULE BY MOUTH ONCE DAILY   Vibegron  (GEMTESA ) 75 MG TABS Take 1 tablet (75 mg total) by mouth daily.   [DISCONTINUED] Semaglutide  (RYBELSUS ) 14 MG TABS Take 1 tablet (14 mg total) by mouth daily.   [DISCONTINUED] zolpidem  (AMBIEN ) 10 MG tablet Take 1 tablet (10 mg total) by mouth at bedtime.   No facility-administered  medications prior to visit.    Review of Systems  Constitutional: Negative.  Negative for weight loss.  HENT: Negative.    Eyes: Negative.   Respiratory: Negative.    Cardiovascular: Negative.   Gastrointestinal: Negative.   Genitourinary:  Positive for dysuria, frequency and urgency.       Nocturia  Musculoskeletal: Negative.   Skin: Negative.   Neurological: Negative.   Endo/Heme/Allergies: Negative.        Objective:   BP 119/78   Pulse (!) 57   Temp (!) 97.5 F (36.4 C)   Ht 6' 2 (1.88 m)   Wt 205 lb 6.4 oz (93.2 kg)   SpO2 97%   BMI 26.37 kg/m   Vitals:   07/10/24 0921  BP: 119/78  Pulse: (!) 57  Temp: (!) 97.5 F (36.4 C)  Height: 6' 2 (1.88 m)  Weight: 205 lb 6.4 oz (93.2 kg)  SpO2: 97%  BMI (Calculated): 26.36    Physical Exam Vitals and nursing note reviewed.  Constitutional:      Appearance: Normal appearance. He is overweight.  HENT:     Head: Normocephalic.     Left Ear: There is no impacted cerumen.     Nose: Nose normal.     Mouth/Throat:     Mouth: Mucous membranes are moist.     Pharynx: No posterior oropharyngeal erythema.  Eyes:     Extraocular Movements: Extraocular movements intact.     Pupils: Pupils are equal, round, and reactive to light.  Cardiovascular:     Rate and Rhythm: Regular rhythm.     Chest Wall: PMI is not displaced.     Pulses: Normal pulses.     Heart sounds: Normal heart sounds. No murmur heard. Pulmonary:     Effort: Pulmonary effort is normal.     Breath sounds: Normal air entry. No rhonchi or rales.  Abdominal:     General: Abdomen is flat. Bowel sounds are normal. There is no distension.     Palpations: Abdomen is soft. There is no hepatomegaly, splenomegaly or mass.     Tenderness: There is no abdominal tenderness.  Musculoskeletal:        General: Normal range of motion.     Cervical back: Normal range of motion and neck supple.     Right lower leg: No edema.     Left lower leg: No edema.  Skin:     General: Skin is warm and dry.  Neurological:  General: No focal deficit present.     Mental Status: He is alert and oriented to person, place, and time.     Cranial Nerves: No cranial nerve deficit.     Motor: Tremor present. No weakness.  Psychiatric:        Mood and Affect: Mood normal.        Behavior: Behavior normal.      Results for orders placed or performed in visit on 07/10/24  POCT CBG (Fasting - Glucose)  Result Value Ref Range   Glucose Fasting, POC 110 (A) 70 - 99 mg/dL    Recent Results (from the past 2160 hours)  Lipid panel     Status: None   Collection Time: 07/04/24  8:12 AM  Result Value Ref Range   Cholesterol, Total 120 100 - 199 mg/dL   Triglycerides 87 0 - 149 mg/dL   HDL 61 >60 mg/dL   VLDL Cholesterol Cal 17 5 - 40 mg/dL   LDL Chol Calc (NIH) 42 0 - 99 mg/dL   Chol/HDL Ratio 2.0 0.0 - 5.0 ratio    Comment:                                   T. Chol/HDL Ratio                                             Men  Women                               1/2 Avg.Risk  3.4    3.3                                   Avg.Risk  5.0    4.4                                2X Avg.Risk  9.6    7.1                                3X Avg.Risk 23.4   11.0   Hemoglobin A1c     Status: Abnormal   Collection Time: 07/04/24  8:12 AM  Result Value Ref Range   Hgb A1c MFr Bld 6.2 (H) 4.8 - 5.6 %    Comment:          Prediabetes: 5.7 - 6.4          Diabetes: >6.4          Glycemic control for adults with diabetes: <7.0    Est. average glucose Bld gHb Est-mCnc 131 mg/dL  PSA     Status: None   Collection Time: 07/04/24  8:12 AM  Result Value Ref Range   Prostate Specific Ag, Serum 1.8 0.0 - 4.0 ng/mL    Comment: Roche ECLIA methodology. According to the American Urological Association, Serum PSA should decrease and remain at undetectable levels after radical prostatectomy. The AUA defines biochemical recurrence as an initial PSA value 0.2 ng/mL or greater followed by a  subsequent confirmatory PSA value 0.2 ng/mL  or greater. Values obtained with different assay methods or kits cannot be used interchangeably. Results cannot be interpreted as absolute evidence of the presence or absence of malignant disease.   POCT CBG (Fasting - Glucose)     Status: Abnormal   Collection Time: 07/10/24  9:30 AM  Result Value Ref Range   Glucose Fasting, POC 110 (A) 70 - 99 mg/dL      Assessment & Plan:  As per problem list  Problem List Items Addressed This Visit       Cardiovascular and Mediastinum   Hypertension   Relevant Medications   zolpidem  (AMBIEN ) 10 MG tablet     Endocrine   Type 2 diabetes mellitus without complication, without long-term current use of insulin (HCC) - Primary   Relevant Medications   zolpidem  (AMBIEN ) 10 MG tablet   Semaglutide  (RYBELSUS ) 14 MG TABS   Other Relevant Orders   POCT CBG (Fasting - Glucose) (Completed)   Hemoglobin A1c   Lipid panel     Other   Primary insomnia   Relevant Medications   zolpidem  (AMBIEN ) 10 MG tablet    Return in 3 months (on 10/10/2024) for fu with labs prior.   Total time spent: 35 minutes  Sherrill Cinderella Perry, MD  07/10/2024   This document may have been prepared by [facility] Voice Recognition software and as such may include unintentional dictation errors.

## 2024-07-16 ENCOUNTER — Encounter: Payer: Self-pay | Admitting: Urology

## 2024-07-29 ENCOUNTER — Other Ambulatory Visit: Payer: Self-pay | Admitting: Cardiovascular Disease

## 2024-08-14 ENCOUNTER — Telehealth: Payer: Self-pay

## 2024-08-14 NOTE — Telephone Encounter (Signed)
 Pt requesting a call back from [facility]

## 2024-08-16 NOTE — Telephone Encounter (Signed)
 Patient had questions for a friend of his that needs to have surgery and was asking about surgical centers that I would recommend

## 2024-09-05 ENCOUNTER — Other Ambulatory Visit: Payer: Self-pay | Admitting: Internal Medicine

## 2024-09-12 ENCOUNTER — Telehealth: Payer: Self-pay

## 2024-09-12 NOTE — Telephone Encounter (Signed)
 Per Dr Fernand patient has tried calling the office on Tuesday 9/23 to schedule and appt with him but did not get an answer or call back unsure if a message was left. I called the patient and LM for pt to call back to get  scheduled by either myself or the front desk.

## 2024-09-20 NOTE — Telephone Encounter (Signed)
 Dr Denyse spoke with patient during coffee and pt states he didn't receive a call back from the office in regards to an appt but also stated that he was offered and appt at the end of the year, OM Bari Gaskins has called the patient and LM for him to call back about getting appt scheduled

## 2024-10-10 ENCOUNTER — Emergency Department

## 2024-10-10 ENCOUNTER — Encounter: Payer: Self-pay | Admitting: Emergency Medicine

## 2024-10-10 ENCOUNTER — Other Ambulatory Visit: Payer: Self-pay

## 2024-10-10 ENCOUNTER — Inpatient Hospital Stay
Admission: EM | Admit: 2024-10-10 | Discharge: 2024-10-13 | DRG: 871 | Disposition: A | Discharge status: Home Health | Attending: Internal Medicine | Admitting: Internal Medicine

## 2024-10-10 DIAGNOSIS — F32A Depression, unspecified: Secondary | ICD-10-CM | POA: Diagnosis present

## 2024-10-10 DIAGNOSIS — D72819 Decreased white blood cell count, unspecified: Secondary | ICD-10-CM | POA: Diagnosis present

## 2024-10-10 DIAGNOSIS — G20B1 Parkinson's disease with dyskinesia, without mention of fluctuations: Secondary | ICD-10-CM | POA: Diagnosis not present

## 2024-10-10 DIAGNOSIS — E119 Type 2 diabetes mellitus without complications: Secondary | ICD-10-CM | POA: Diagnosis present

## 2024-10-10 DIAGNOSIS — Z833 Family history of diabetes mellitus: Secondary | ICD-10-CM | POA: Diagnosis not present

## 2024-10-10 DIAGNOSIS — Z79899 Other long term (current) drug therapy: Secondary | ICD-10-CM | POA: Diagnosis not present

## 2024-10-10 DIAGNOSIS — Z1152 Encounter for screening for COVID-19: Secondary | ICD-10-CM | POA: Diagnosis not present

## 2024-10-10 DIAGNOSIS — N179 Acute kidney failure, unspecified: Secondary | ICD-10-CM | POA: Diagnosis not present

## 2024-10-10 DIAGNOSIS — A419 Sepsis, unspecified organism: Secondary | ICD-10-CM | POA: Diagnosis present

## 2024-10-10 DIAGNOSIS — E872 Acidosis, unspecified: Secondary | ICD-10-CM | POA: Diagnosis present

## 2024-10-10 DIAGNOSIS — E785 Hyperlipidemia, unspecified: Secondary | ICD-10-CM | POA: Diagnosis present

## 2024-10-10 DIAGNOSIS — N401 Enlarged prostate with lower urinary tract symptoms: Secondary | ICD-10-CM | POA: Diagnosis present

## 2024-10-10 DIAGNOSIS — R350 Frequency of micturition: Secondary | ICD-10-CM | POA: Diagnosis present

## 2024-10-10 DIAGNOSIS — G4730 Sleep apnea, unspecified: Secondary | ICD-10-CM | POA: Diagnosis present

## 2024-10-10 DIAGNOSIS — R6521 Severe sepsis with septic shock: Secondary | ICD-10-CM | POA: Diagnosis present

## 2024-10-10 DIAGNOSIS — N390 Urinary tract infection, site not specified: Secondary | ICD-10-CM | POA: Diagnosis not present

## 2024-10-10 DIAGNOSIS — K219 Gastro-esophageal reflux disease without esophagitis: Secondary | ICD-10-CM | POA: Diagnosis present

## 2024-10-10 DIAGNOSIS — R918 Other nonspecific abnormal finding of lung field: Secondary | ICD-10-CM | POA: Diagnosis not present

## 2024-10-10 DIAGNOSIS — I10 Essential (primary) hypertension: Secondary | ICD-10-CM | POA: Diagnosis not present

## 2024-10-10 DIAGNOSIS — Z7984 Long term (current) use of oral hypoglycemic drugs: Secondary | ICD-10-CM | POA: Diagnosis not present

## 2024-10-10 DIAGNOSIS — Z8249 Family history of ischemic heart disease and other diseases of the circulatory system: Secondary | ICD-10-CM | POA: Diagnosis not present

## 2024-10-10 DIAGNOSIS — R3912 Poor urinary stream: Secondary | ICD-10-CM | POA: Diagnosis present

## 2024-10-10 DIAGNOSIS — Z803 Family history of malignant neoplasm of breast: Secondary | ICD-10-CM

## 2024-10-10 DIAGNOSIS — Z9089 Acquired absence of other organs: Secondary | ICD-10-CM

## 2024-10-10 DIAGNOSIS — E538 Deficiency of other specified B group vitamins: Secondary | ICD-10-CM | POA: Diagnosis present

## 2024-10-10 DIAGNOSIS — Z9889 Other specified postprocedural states: Secondary | ICD-10-CM

## 2024-10-10 DIAGNOSIS — Z888 Allergy status to other drugs, medicaments and biological substances status: Secondary | ICD-10-CM

## 2024-10-10 DIAGNOSIS — R338 Other retention of urine: Secondary | ICD-10-CM | POA: Diagnosis present

## 2024-10-10 DIAGNOSIS — Z860100 Personal history of colon polyps, unspecified: Secondary | ICD-10-CM

## 2024-10-10 DIAGNOSIS — Z800 Family history of malignant neoplasm of digestive organs: Secondary | ICD-10-CM

## 2024-10-10 DIAGNOSIS — A4181 Sepsis due to Enterococcus: Principal | ICD-10-CM | POA: Diagnosis present

## 2024-10-10 DIAGNOSIS — N200 Calculus of kidney: Secondary | ICD-10-CM | POA: Diagnosis not present

## 2024-10-10 DIAGNOSIS — R112 Nausea with vomiting, unspecified: Secondary | ICD-10-CM | POA: Diagnosis not present

## 2024-10-10 DIAGNOSIS — R61 Generalized hyperhidrosis: Secondary | ICD-10-CM | POA: Diagnosis not present

## 2024-10-10 DIAGNOSIS — Z8719 Personal history of other diseases of the digestive system: Secondary | ICD-10-CM

## 2024-10-10 DIAGNOSIS — R1084 Generalized abdominal pain: Secondary | ICD-10-CM | POA: Diagnosis not present

## 2024-10-10 LAB — COMPREHENSIVE METABOLIC PANEL WITH GFR
ALT: 10 U/L (ref 0–44)
AST: 27 U/L (ref 15–41)
Albumin: 4.2 g/dL (ref 3.5–5.0)
Alkaline Phosphatase: 80 U/L (ref 38–126)
Anion gap: 15 (ref 5–15)
BUN: 17 mg/dL (ref 8–23)
CO2: 21 mmol/L — ABNORMAL LOW (ref 22–32)
Calcium: 9.2 mg/dL (ref 8.9–10.3)
Chloride: 108 mmol/L (ref 98–111)
Creatinine, Ser: 1.14 mg/dL (ref 0.61–1.24)
GFR, Estimated: >60 mL/min (ref >60)
Glucose, Bld: 96 mg/dL (ref 70–99)
Potassium: 3.7 mmol/L (ref 3.5–5.1)
Sodium: 144 mmol/L (ref 135–145)
Total Bilirubin: 1.2 mg/dL (ref 0.0–1.2)
Total Protein: 7.5 g/dL (ref 6.5–8.1)

## 2024-10-10 LAB — URINALYSIS, W/ REFLEX TO CULTURE (INFECTION SUSPECTED)
Bilirubin Urine: NEGATIVE
Glucose, UA: NEGATIVE mg/dL
Ketones, ur: 5 mg/dL — AB
Nitrite: NEGATIVE
Protein, ur: 100 mg/dL — AB
RBC / HPF: >50 RBC/hpf (ref 0–5)
Specific Gravity, Urine: 1.025 (ref 1.005–1.030)
WBC, UA: >50 WBC/hpf (ref 0–5)
pH: 5 (ref 5.0–8.0)

## 2024-10-10 LAB — RESP PANEL BY RT-PCR (RSV, FLU A&B, COVID)  RVPGX2
Influenza A by PCR: NEGATIVE
Influenza B by PCR: NEGATIVE
Resp Syncytial Virus by PCR: NEGATIVE
SARS Coronavirus 2 by RT PCR: NEGATIVE

## 2024-10-10 LAB — CBC WITH DIFFERENTIAL/PLATELET
Abs Immature Granulocytes: 0.01 K/uL (ref 0.00–0.07)
Basophils Absolute: 0 K/uL (ref 0.0–0.1)
Basophils Relative: 0 %
Eosinophils Absolute: 0 K/uL (ref 0.0–0.5)
Eosinophils Relative: 0 %
HCT: 44.8 % (ref 39.0–52.0)
Hemoglobin: 15 g/dL (ref 13.0–17.0)
Immature Granulocytes: 0 %
Lymphocytes Relative: 4 %
Lymphs Abs: 0.1 K/uL — ABNORMAL LOW (ref 0.7–4.0)
MCH: 29.4 pg (ref 26.0–34.0)
MCHC: 33.5 g/dL (ref 30.0–36.0)
MCV: 87.8 fL (ref 80.0–100.0)
Monocytes Absolute: 0 K/uL — ABNORMAL LOW (ref 0.1–1.0)
Monocytes Relative: 0 %
Neutro Abs: 2.7 K/uL (ref 1.7–7.7)
Neutrophils Relative %: 96 %
Platelets: 126 K/uL — ABNORMAL LOW (ref 150–400)
RBC: 5.1 MIL/uL (ref 4.22–5.81)
RDW: 12.9 % (ref 11.5–15.5)
WBC: 2.8 K/uL — ABNORMAL LOW (ref 4.0–10.5)
nRBC: 0 % (ref 0.0–0.2)

## 2024-10-10 LAB — LACTIC ACID, PLASMA
Lactic Acid, Venous: 5 mmol/L (ref 0.5–1.9)
Lactic Acid, Venous: 5.6 mmol/L (ref 0.5–1.9)
Lactic Acid, Venous: 4.4 mmol/L (ref 0.5–1.9)
Lactic Acid, Venous: 2.6 mmol/L (ref 0.5–1.9)

## 2024-10-10 LAB — GLUCOSE, CAPILLARY
Glucose-Capillary: 103 mg/dL — ABNORMAL HIGH (ref 70–99)
Glucose-Capillary: 116 mg/dL — ABNORMAL HIGH (ref 70–99)

## 2024-10-10 LAB — PROTIME-INR
INR: 1 (ref 0.8–1.2)
Prothrombin Time: 14.2 s (ref 11.4–15.2)

## 2024-10-10 MED ORDER — SODIUM CHLORIDE 0.9 % IV SOLN: INTRAVENOUS | Status: AC

## 2024-10-10 MED ORDER — CARBIDOPA-LEVODOPA 25-100 MG PO TABS
2.0000 | ORAL_TABLET | Freq: Three times a day (TID) | ORAL | Status: DC
  Administered 2024-10-10 – 2024-10-13 (×9): 2 via ORAL
  Filled 2024-10-10 (×9): qty 2

## 2024-10-10 MED ORDER — SODIUM CHLORIDE 0.9 % IV BOLUS
1000.0000 mL | Freq: Once | INTRAVENOUS | Status: AC
  Administered 2024-10-10: 1000 mL via INTRAVENOUS

## 2024-10-10 MED ORDER — SODIUM CHLORIDE 0.9 % IV SOLN
2.0000 g | Freq: Once | INTRAVENOUS | Status: AC
  Administered 2024-10-10: 2 g via INTRAVENOUS
  Filled 2024-10-10: qty 12.5

## 2024-10-10 MED ORDER — ROSUVASTATIN CALCIUM 20 MG PO TABS
40.0000 mg | ORAL_TABLET | Freq: Every day | ORAL | Status: DC
  Administered 2024-10-10 – 2024-10-12 (×3): 40 mg via ORAL
  Filled 2024-10-10: qty 4
  Filled 2024-10-10: qty 2
  Filled 2024-10-10: qty 4
  Filled 2024-10-10: qty 2
  Filled 2024-10-10: qty 4
  Filled 2024-10-10: qty 2

## 2024-10-10 MED ORDER — SODIUM CHLORIDE 0.9 % IV SOLN: 2.0000 g | INTRAVENOUS | Status: DC

## 2024-10-10 MED ORDER — SODIUM CHLORIDE 0.9 % IV SOLN
2.0000 g | INTRAVENOUS | Status: DC
  Administered 2024-10-10 – 2024-10-11 (×2): 2 g via INTRAVENOUS
  Filled 2024-10-10 (×2): qty 20

## 2024-10-10 MED ORDER — INFLUENZA VAC SPLIT HIGH-DOSE 0.5 ML IM SUSY
0.5000 mL | PREFILLED_SYRINGE | INTRAMUSCULAR | Status: DC
Start: 2024-10-11 — End: 2024-10-13
  Filled 2024-10-10: qty 0.5

## 2024-10-10 MED ORDER — SODIUM CHLORIDE 0.9 % IV BOLUS
500.0000 mL | Freq: Once | INTRAVENOUS | Status: AC
  Administered 2024-10-10: 500 mL via INTRAVENOUS

## 2024-10-10 MED ORDER — ONDANSETRON HCL 4 MG/2ML IJ SOLN: 4.0000 mg | Freq: Once | INTRAMUSCULAR | Status: AC

## 2024-10-10 MED ORDER — ACETAMINOPHEN 500 MG PO TABS
1000.0000 mg | ORAL_TABLET | Freq: Once | ORAL | Status: AC
  Administered 2024-10-10: 1000 mg via ORAL
  Filled 2024-10-10: qty 2

## 2024-10-10 MED ORDER — METRONIDAZOLE 500 MG/100ML IV SOLN
500.0000 mg | Freq: Once | INTRAVENOUS | Status: AC
  Administered 2024-10-10: 500 mg via INTRAVENOUS
  Filled 2024-10-10: qty 100

## 2024-10-10 MED ORDER — ONDANSETRON HCL 4 MG/2ML IJ SOLN: 4.0000 mg | Freq: Four times a day (QID) | INTRAMUSCULAR | Status: DC | PRN

## 2024-10-10 MED ORDER — ENOXAPARIN SODIUM 40 MG/0.4ML IJ SOSY
40.0000 mg | PREFILLED_SYRINGE | Freq: Every day | INTRAMUSCULAR | Status: DC
  Administered 2024-10-10 – 2024-10-12 (×3): 40 mg via SUBCUTANEOUS
  Filled 2024-10-10 (×3): qty 0.4

## 2024-10-10 MED ORDER — TAMSULOSIN HCL 0.4 MG PO CAPS
0.4000 mg | ORAL_CAPSULE | Freq: Every day | ORAL | Status: DC
Start: 2024-10-11 — End: 2024-10-13
  Administered 2024-10-11 – 2024-10-13 (×3): 0.4 mg via ORAL
  Filled 2024-10-10 (×3): qty 1

## 2024-10-10 MED ORDER — VENLAFAXINE HCL ER 75 MG PO CP24
75.0000 mg | ORAL_CAPSULE | Freq: Every day | ORAL | Status: DC
  Administered 2024-10-11 – 2024-10-13 (×3): 75 mg via ORAL
  Filled 2024-10-10 (×3): qty 1

## 2024-10-10 MED ORDER — LACTATED RINGERS IV BOLUS
1000.0000 mL | Freq: Once | INTRAVENOUS | Status: AC
  Administered 2024-10-10: 1000 mL via INTRAVENOUS

## 2024-10-10 MED ORDER — MIRABEGRON ER 25 MG PO TB24
25.0000 mg | ORAL_TABLET | Freq: Every day | ORAL | Status: DC
  Administered 2024-10-11 – 2024-10-13 (×3): 25 mg via ORAL
  Filled 2024-10-10 (×3): qty 1

## 2024-10-10 MED ORDER — ONDANSETRON HCL 4 MG PO TABS: 4.0000 mg | ORAL_TABLET | Freq: Four times a day (QID) | ORAL | Status: DC | PRN

## 2024-10-10 MED ORDER — INSULIN ASPART 100 UNIT/ML IJ SOLN: 0.0000 [IU] | Freq: Three times a day (TID) | INTRAMUSCULAR | Status: DC

## 2024-10-10 MED ORDER — ZOLPIDEM TARTRATE 5 MG PO TABS
10.0000 mg | ORAL_TABLET | Freq: Every day | ORAL | Status: DC
  Administered 2024-10-10 – 2024-10-12 (×3): 10 mg via ORAL
  Filled 2024-10-10 (×3): qty 2

## 2024-10-10 MED ORDER — IOHEXOL 300 MG/ML  SOLN
100.0000 mL | Freq: Once | INTRAMUSCULAR | Status: AC | PRN
  Administered 2024-10-10: 100 mL via INTRAVENOUS

## 2024-10-10 MED ORDER — PANTOPRAZOLE SODIUM 40 MG PO TBEC
40.0000 mg | DELAYED_RELEASE_TABLET | Freq: Two times a day (BID) | ORAL | Status: DC
  Administered 2024-10-10 – 2024-10-13 (×6): 40 mg via ORAL
  Filled 2024-10-10 (×6): qty 1

## 2024-10-10 MED ORDER — ACETAMINOPHEN 325 MG PO TABS
650.0000 mg | ORAL_TABLET | Freq: Four times a day (QID) | ORAL | Status: DC | PRN
  Administered 2024-10-10: 650 mg via ORAL
  Filled 2024-10-10: qty 2

## 2024-10-10 MED ORDER — VANCOMYCIN HCL IN DEXTROSE 1-5 GM/200ML-% IV SOLN
1000.0000 mg | Freq: Once | INTRAVENOUS | Status: AC
  Administered 2024-10-10: 1000 mg via INTRAVENOUS
  Filled 2024-10-10: qty 200

## 2024-10-10 MED ORDER — ACETAMINOPHEN 650 MG RE SUPP: 650.0000 mg | Freq: Four times a day (QID) | RECTAL | Status: DC | PRN

## 2024-10-10 MED ORDER — ONDANSETRON HCL 4 MG/2ML IJ SOLN
INTRAMUSCULAR | Status: AC
  Administered 2024-10-10: 4 mg via INTRAVENOUS
  Filled 2024-10-10: qty 2

## 2024-10-10 NOTE — Sepsis Progress Note (Signed)
 Elink monitoring for the code sepsis protocol.

## 2024-10-10 NOTE — Sepsis Progress Note (Signed)
 Notified provider of need to order repeat lactic acid.   Lactic acid #1 5.0 Lactic acid #2 5.6

## 2024-10-10 NOTE — Consult Note (Signed)
 CODE SEPSIS - PHARMACY COMMUNICATION  **Broad Spectrum Antibiotics should be administered within 1 hour of Sepsis diagnosis**  Time Code Sepsis Called/Page Received: 1211  Antibiotics Ordered: Cefepime 2G x1, Flagyl 500mg  x1, Vancomycin 1G x1   Time of 1st antibiotic administration: 1248  Additional action taken by pharmacy: none   Annabella LOISE Banks ,PharmD Clinical Pharmacist  10/10/2024  12:36 PM

## 2024-10-10 NOTE — ED Provider Notes (Signed)
 Care of this patient assumed from prior physician at 1500 pending CT, UA, and disposition. Please see prior physician note for further details.  Briefly this is a 78 year old male presenting with lower abdominal pain.  Labs with leukopenia with white blood cell count of 2.8, CMP without critical derangements.  Negative viral swab.  Significant lactic acidosis at 5, did uptrend, ordered for 30 cc/kg of fluid as well as broad-spectrum antibiotics.  Signed out to me pending CT scan and urine.  High concern for UTI.  Anticipated admission.   Clinical Course as of 10/10/24 1725  Thu Oct 10, 2024  1552 CT scan demonstrated thickened bladder concerning for cystitis.  No obstructing renal calculi or hydronephrosis noted.  Urinalysis did result concerning for infection with greater than 50 white blood cells, white blood cell clumps, large leukocyte esterase.  Patient has already received broad-spectrum antibiotics.  Lactate did uptrend to 5.6, repeat ordered.  Patient reassessed.  Denies any complaints.  Updated on results of workup.  Agreeable with plan for admission.  Will check to hospitalist team. [NR]  1559 Case discussed with Dr. Laurita. He will evaluate for anticipated admission.  [NR]    Clinical Course User Index [NR] Levander Slate, MD       Levander Slate, MD 10/10/24 [phone]

## 2024-10-10 NOTE — H&P (Signed)
 History and Physical    Bruce Wyatt. FMW:[id number] DOB: 1946-05-20 DOA: 10/10/2024  PCP: Albina Wyatt Dine, MD (Confirm with patient/family/NH records and if not entered, this has to be entered at [facility] point of entry) Patient coming from: Home  I have personally briefly reviewed patient's old medical records in [facility] Link  Chief Complaint: Fever, chills, belly hurts, burning when urinate  HPI: Bruce Wieber. is a 78 y.o. male with medical history significant of poorly controlled BPH, IIDM, HTN, HLD, Parkinson's disease, presented with multiple complaints including suprapubic abdominal pain, dysuria, fever and chills.  Symptoms started 3 days ago patient started develop urinary frequency, burning sensation while urinating and suprapubic sharp like abdominal pain.  He also had subjective fever and chills but denied nauseous vomiting, no chest pain no cough no shortness of breath.  Patient has a poorly controlled BPH, described as weak stream and broken stream, he follows with urology once a year. ED Course: Temperature 101.9, borderline tachycardia blood pressure 130/90 O2 saturation 96% on room air.  UA showed 3+ WBC, 2+ RBC, lactic acid 5.0> 5.6, BUN 17 creatinine 1.0 WBC 2.8 hemoglobin 15.  Patient was given IV bolus and started on broad-spectrum antibiotics including cefepime and vancomycin.  Review of Systems: As per HPI otherwise 14 point review of systems negative.    Past Medical History:  Diagnosis Date   Abnormal colonoscopy    polyps noted, benign, [facility], rec 10 year follow up   Alcoholism (HCC)    11 yrs ago    BPH (benign prostatic hyperplasia)    Diabetes mellitus    Diverticulosis    Gallstones    GERD (gastroesophageal reflux disease)    H/O hematuria    Hemorrhoids    Hyperlipidemia    Hypertension    Iron deficiency    Sleep apnea    Vitamin D deficiency     Past Surgical History:  Procedure Laterality Date   CARDIOVASCULAR STRESS TEST   02/12/2009   Negative/AT [facility] Dr Lynwood Bare   COLONOSCOPY     CYSTOSCOPY  07/03/2012   RHINOPLASTY     soft palate reduction for apnea   Soft palate reduction for Sleep Apnea     [facility]   TONSILLECTOMY     UPPER GASTROINTESTINAL ENDOSCOPY     VASECTOMY       reports that he has never smoked. He has never used smokeless tobacco. He reports that he does not drink alcohol and does not use drugs.  Allergies  Allergen Reactions   Ace Inhibitors     COUGH    Family History  Problem Relation Age of Onset   Breast cancer Mother    Heart disease Father        Heart Attack   Colon cancer Maternal Grandfather    Diabetes Paternal Grandfather    Heart disease Paternal Grandfather    Coronary artery disease Father    Nephrolithiasis Daughter      Prior to Admission medications   Medication Sig Start Date End Date Taking? Authorizing Provider  carbidopa-levodopa (SINEMET IR) 25-100 MG tablet TAKE 2 TABLETS BY MOUTH THREE TIMES DAILY 06/14/24   Albina Wyatt Dine, MD  cyanocobalamin  (VITAMIN B12) 1000 MCG tablet Take 1,000 mcg by mouth daily.    [provider]  metFORMIN  (GLUCOPHAGE ) 500 MG tablet Take 1 tablet (500 mg total) by mouth 2 (two) times daily. 04/10/24   Tejan-Sie, S Ahmed, MD  Multiple Vitamins-Minerals (MULTIVITAMIN WITH MINERALS)  tablet Take 1 tablet by mouth daily.    [provider]  mupirocin ointment (BACTROBAN) 2 % Apply 1 Application topically as needed. 11/16/21   [provider]  pantoprazole (PROTONIX) 40 MG tablet TAKE 1 TABLET BY MOUTH TWICE DAILY 07/29/24   Tejan-Sie, S Ahmed, MD  rosuvastatin  (CRESTOR ) 40 MG tablet TAKE 1 TABLET BY MOUTH DAILY 07/08/24   Albina Wyatt Dine, MD  Semaglutide  (RYBELSUS ) 14 MG TABS Take 1 tablet (14 mg total) by mouth daily. 07/10/24   Albina Wyatt Dine, MD  silodosin  (RAPAFLO ) 8 MG CAPS capsule TAKE 1 CAPSULE BY MOUTH ONCE DAILY WITH BREAKFAST 09/05/24   Bruce Fredy GORMAN, MD  venlafaxine  XR  (EFFEXOR -XR) 75 MG 24 hr capsule TAKE 1 CAPSULE BY MOUTH ONCE DAILY 04/23/24   Tejan-Sie, S Ahmed, MD  Vibegron  (GEMTESA ) 75 MG TABS Take 1 tablet (75 mg total) by mouth daily. 12/22/23   Stoioff, Glendia BROCKS, MD  zolpidem  (AMBIEN ) 10 MG tablet Take 1 tablet (10 mg total) by mouth at bedtime. 07/10/24 10/08/24  Albina Wyatt Dine, MD    Physical Exam: Vitals:   10/10/24 1415 10/10/24 1430 10/10/24 1445 10/10/24 1535  BP: 136/79 118/83 (!) 178/161 (!) 132/90  Pulse:    96  Resp: (!) 25 (!) 21 (!) 24 20  Temp:    98.4 F (36.9 C)  TempSrc:    Oral  SpO2: 100% 95% 96% 94%  Weight:      Height:        Constitutional: NAD, calm, comfortable Vitals:   10/10/24 1415 10/10/24 1430 10/10/24 1445 10/10/24 1535  BP: 136/79 118/83 (!) 178/161 (!) 132/90  Pulse:    96  Resp: (!) 25 (!) 21 (!) 24 20  Temp:    98.4 F (36.9 C)  TempSrc:    Oral  SpO2: 100% 95% 96% 94%  Weight:      Height:       Eyes: PERRL, lids and conjunctivae normal ENMT: Mucous membranes are moist. Posterior pharynx clear of any exudate or lesions.Normal dentition.  Neck: normal, supple, no masses, no thyromegaly Respiratory: clear to auscultation bilaterally, no wheezing, no crackles. Normal respiratory effort. No accessory muscle use.  Cardiovascular: Regular rate and rhythm, no murmurs / rubs / gallops. No extremity edema. 2+ pedal pulses. No carotid bruits.  Abdomen: Tenderness on suprapubic area, no rebound no guarding, no masses palpated. No hepatosplenomegaly. Bowel sounds positive.  Musculoskeletal: no clubbing / cyanosis. No joint deformity upper and lower extremities. Good ROM, no contractures. Normal muscle tone.  Skin: no rashes, lesions, ulcers. No induration Neurologic: CN 2-12 grossly intact. Sensation intact, DTR normal. Strength 5/5 in all 4.  Psychiatric: Normal judgment and insight. Alert and oriented x 3. Normal mood.     Labs on Admission: I have personally reviewed following labs and imaging  studies  CBC: Recent Labs  Lab 10/10/24 1211  WBC 2.8*  NEUTROABS 2.7  HGB 15.0  HCT 44.8  MCV 87.8  PLT 126*   Basic Metabolic Panel: Recent Labs  Lab 10/10/24 1211  NA 144  K 3.7  CL 108  CO2 21*  GLUCOSE 96  BUN 17  CREATININE 1.14  CALCIUM  9.2   GFR: Estimated Creatinine Clearance: 62.1 mL/min (by C-G formula based on SCr of 1.14 mg/dL). Liver Function Tests: Recent Labs  Lab 10/10/24 1211  AST 27  ALT 10  ALKPHOS 80  BILITOT 1.2  PROT 7.5  ALBUMIN 4.2   No results for input(s): LIPASE, AMYLASE  in the last 168 hours. No results for input(s): AMMONIA in the last 168 hours. Coagulation Profile: Recent Labs  Lab 10/10/24 1211  INR 1.0   Cardiac Enzymes: No results for input(s): CKTOTAL, CKMB, CKMBINDEX, TROPONINI in the last 168 hours. BNP (last 3 results) No results for input(s): PROBNP in the last 8760 hours. HbA1C: No results for input(s): HGBA1C in the last 72 hours. CBG: No results for input(s): GLUCAP in the last 168 hours. Lipid Profile: No results for input(s): CHOL, HDL, LDLCALC, TRIG, CHOLHDL, LDLDIRECT in the last 72 hours. Thyroid  Function Tests: No results for input(s): TSH, T4TOTAL, FREET4, T3FREE, THYROIDAB in the last 72 hours. Anemia Panel: No results for input(s): VITAMINB12, FOLATE, FERRITIN, TIBC, IRON, RETICCTPCT in the last 72 hours. Urine analysis:    Component Value Date/Time   COLORURINE AMBER (A) 10/10/2024 1515   APPEARANCEUR TURBID (A) 10/10/2024 1515   APPEARANCEUR Cloudy (A) 12/26/2023 0938   LABSPEC 1.025 10/10/2024 1515   PHURINE 5.0 10/10/2024 1515   GLUCOSEU NEGATIVE 10/10/2024 1515   HGBUR LARGE (A) 10/10/2024 1515   BILIRUBINUR NEGATIVE 10/10/2024 1515   BILIRUBINUR Negative 12/26/2023 0938   KETONESUR 5 (A) 10/10/2024 1515   PROTEINUR 100 (A) 10/10/2024 1515   UROBILINOGEN 0.2 12/08/2023 1204   NITRITE NEGATIVE 10/10/2024 1515   LEUKOCYTESUR LARGE  (A) 10/10/2024 1515    Radiological Exams on Admission: CT ABDOMEN PELVIS W CONTRAST Result Date: 10/10/2024 CLINICAL DATA:  Abdominal pain. EXAM: CT ABDOMEN AND PELVIS WITH CONTRAST TECHNIQUE: Multidetector CT imaging of the abdomen and pelvis was performed using the standard protocol following bolus administration of intravenous contrast. RADIATION DOSE REDUCTION: This exam was performed according to the departmental dose-optimization program which includes automated exposure control, adjustment of the mA and/or kV according to patient size and/or use of iterative reconstruction technique. CONTRAST:  100mL OMNIPAQUE IOHEXOL 300 MG/ML  SOLN COMPARISON:  CT abdomen pelvis dated 07/03/2012. FINDINGS: Evaluation is limited due to streak artifact caused by patient's arms and overlying support wires. Lower chest: The visualized lung bases are clear. There is coronary vascular calcification. Pericardial effusion measures 9 mm in thickness anterior to the heart. No intra-abdominal free air or free fluid. Hepatobiliary: The liver is unremarkable. No biliary ductal dilatation. Multiple gallstones. No pericholecystic fluid or evidence of acute cholecystitis by CT. Pancreas: Unremarkable. No pancreatic ductal dilatation or surrounding inflammatory changes. Spleen: Normal in size without focal abnormality. Adrenals/Urinary Tract: The adrenal glands unremarkable. Nonobstructing renal calculi in the upper pole of the right kidney and inferior pole of the left kidney measure up to 2 mm. There is no hydronephrosis on either side. There is symmetric enhancement and excretion of contrast by both kidneys. The visualized ureters appear unremarkable. 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 Correlation with urinalysis recommended to evaluate for possibility of acute on chronic cystitis. Stomach/Bowel: There is mild sigmoid diverticulosis.  There is no bowel obstruction or active inflammation. The appendix is normal. Vascular/Lymphatic: Mild aortoiliac atherosclerotic disease. The IVC is unremarkable. No portal venous gas. There is no adenopathy. Reproductive: Enlarged prostate gland measuring 6 cm in transverse axial diameter. Other: None Musculoskeletal: Osteopenia with degenerative changes spine. No acute osseous pathology. IMPRESSION: 1. Cholelithiasis. 2. Small nonobstructing bilateral renal calculi. No hydronephrosis. 3. 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 Correlation with urinalysis recommended to evaluate for possibility of acute on chronic cystitis. 4. Enlarged prostate gland.  5.  Aortic Atherosclerosis (ICD10-I70.0). Electronically Signed   By: Vanetta Chou M.D.   On: 10/10/2024 15:21   DG Chest Port 1 View Result Date: 10/10/2024 CLINICAL DATA:  Sepsis EXAM: PORTABLE CHEST 1 VIEW COMPARISON:  None Available. FINDINGS: Normal lung volumes. Lucency of the upper lungs, right-greater-than-left. Left basilar patchy opacity. No pleural effusion or pneumothorax. The heart size and mediastinal contours are within normal limits. No acute osseous abnormality. IMPRESSION: 1. Left basilar patchy opacity, which may represent atelectasis, aspiration, or pneumonia. 2. Lucency of the upper lungs, right-greater-than-left, which may represent emphysematous changes. Electronically Signed   By: Limin  Xu M.D.   On: 10/10/2024 13:26    EKG: Independently reviewed.  Sinus rhythm, no acute ST changes.  Assessment/Plan Principal Problem:   Sepsis (HCC) Active Problems:   Sepsis secondary to UTI [facility])  (please populate well all problems here in Problem List. (For example, if patient is on BP meds at home and you resume or decide to hold them, it is a problem that needs to be her. Same for CAD, COPD, HLD and so on)  Sepsis without acute endorgan  damage - Sepsis as evidenced by new onset of fever, leukopenia, elevated lactic acid, source of infection likely complicated UTI on top of uncontrolled BPH. - Continue IV fluid - Patient received IV bolus in the ED, de-escalate antibiotic coverage to ceftriaxone.  Urine culture sent.  Lactic acidosis - Likely multifactorial sepsis as well as metformin  side effect - On IV hydration, hold off metformin  and recheck lactic acid this evening.  BPH, poorly controlled - Continue Flomax - Outpatient follow-up with urology to consider TURP  Parkinson's disease - Continue Sinemet  IIDM - Hold off metformin  - SSI for now   DVT prophylaxis: Lovenox Code Status: Full code Family Communication: None at bedside Disposition Plan: Patient sick with urosepsis, complicated UTI requiring IV antibiotics, expect more than 2 midnight hospital Consults called: None Admission status: Telemetry admission   Cort ONEIDA Mana MD [facility] Pager [phone]  10/10/2024, 5:05 PM

## 2024-10-10 NOTE — ED Provider Notes (Signed)
 [facility] Provider Note    Event Date/Time   First MD Initiated Contact with Patient 10/10/24 1206     (approximate)   History   No chief complaint on file.   HPI  Ashely Wyatt. is a 78 y.o. male with diabetes who comes in with concerns for sepsis.  Patient had lower abdominal pain that he reports is mostly in the middle associate with a little bit of dysuria.  He reports having history of Parkinson's and so he has a tremor at baseline.  Denies any falls hitting his head, chest pain, shortness of breath or any other concerns.   Physical Exam   Triage Vital Signs: ED Triage Vitals  Encounter Vitals Group     BP      Girls Systolic BP Percentile      Girls Diastolic BP Percentile      Boys Systolic BP Percentile      Boys Diastolic BP Percentile      Pulse      Resp      Temp      Temp src      SpO2      Weight      Height      Head Circumference      Peak Flow      Pain Score      Pain Loc      Pain Education      Exclude from Growth Chart     Most recent vital signs: Vitals:   10/10/24 1200  BP: 114/65  Pulse: 98  Resp: (!) 22  Temp: (!) 101.9 F (38.8 C)  SpO2: 95%     General: Awake, no distress.  CV:  Good peripheral perfusion.  Resp:  Normal effort.  Abd:  No distention.  Tender in the lower abdomen more suprapubic Other:  Baseline tremor noted from Parkinson's ED Results / Procedures / Treatments   Labs (all labs ordered are listed, but only abnormal results are displayed) Labs Reviewed  RESP PANEL BY RT-PCR (RSV, FLU A&B, COVID)  [id number]  CULTURE, BLOOD (ROUTINE X 2)  CULTURE, BLOOD (ROUTINE X 2)  LACTIC ACID, PLASMA  LACTIC ACID, PLASMA  COMPREHENSIVE METABOLIC PANEL WITH GFR  CBC WITH DIFFERENTIAL/PLATELET  PROTIME-INR  URINALYSIS, W/ REFLEX TO CULTURE (INFECTION SUSPECTED)     EKG  My interpretation of EKG:  Sinus with a rate of 98 without any ST elevation or T wave versions, normal  intervals  RADIOLOGY I have reviewed the xray personally and interpreted no evidence of any pneumonia   PROCEDURES:  Critical Care performed: Yes, see critical care procedure note(s)  .1-3 Lead EKG Interpretation  Performed by: Ernest Ronal BRAVO, MD Authorized by: Ernest Ronal BRAVO, MD     Interpretation: normal     ECG rate:  90   ECG rate assessment: normal     Rhythm: sinus rhythm     Ectopy: none     Conduction: normal   .Critical Care  Performed by: Ernest Ronal BRAVO, MD Authorized by: Ernest Ronal BRAVO, MD   Critical care provider statement:    Critical care time (minutes):  30   Critical care was necessary to treat or prevent imminent or life-threatening deterioration of the following conditions:  Sepsis   Critical care was time spent personally by me on the following activities:  Development of treatment plan with patient or surrogate, discussions with consultants, evaluation of patient's response to treatment, examination of patient,  ordering and review of laboratory studies, ordering and review of radiographic studies, ordering and performing treatments and interventions, pulse oximetry, re-evaluation of patient's condition and review of old charts    MEDICATIONS ORDERED IN ED: Medications  ceFEPIme (MAXIPIME) 2 g in sodium chloride  0.9 % 100 mL IVPB (has no administration in time range)  metroNIDAZOLE (FLAGYL) IVPB 500 mg (has no administration in time range)  vancomycin (VANCOCIN) IVPB 1000 mg/200 mL premix (has no administration in time range)  sodium chloride  0.9 % bolus 1,000 mL (has no administration in time range)  acetaminophen (TYLENOL) tablet 1,000 mg (has no administration in time range)     IMPRESSION / MDM / ASSESSMENT AND PLAN / ED COURSE  I reviewed the triage vital signs and the nursing notes.   Patient's presentation is most consistent with acute presentation with potential threat to life or bodily function.   Patient comes in with fevers, meeting sepsis  criteria therefore fluids and broad-spectrum antibiotics and Tylenol were ordered differential includes UTI.  However given patient's age also get CT imaging to make sure not infected kidney stone or diverticulitis, appendicitis given does report some lower abdominal pain.  Lactate significantly elevated at 5.  Full fluid bolus resuscitation was ordered.  CBC shows low white count.  CMP shows normal creatinine.  2:19 PM  Reevaluated patient heart rates have come down blood pressures are stable.  Patient appears well-perfused.  Attempted In-N-Out but was unsuccessful.  Patient will be handed off to oncoming team pending CT imaging, lactate.  Patient will require admission to the hospital for severe sepsis due to elevated lactate.    The patient is on the cardiac monitor to evaluate for evidence of arrhythmia and/or significant heart rate changes.      FINAL CLINICAL IMPRESSION(S) / ED DIAGNOSES   Final diagnoses:  Sepsis, due to unspecified organism, unspecified whether acute organ dysfunction present [facility])     Rx / DC Orders   ED Discharge Orders     None        Note:  This document was prepared using Dragon voice recognition software and may include unintentional dictation errors.   Ernest Ronal BRAVO, MD 10/10/24 [phone]

## 2024-10-11 ENCOUNTER — Inpatient Hospital Stay

## 2024-10-11 DIAGNOSIS — A419 Sepsis, unspecified organism: Secondary | ICD-10-CM | POA: Diagnosis not present

## 2024-10-11 LAB — GLUCOSE, CAPILLARY
Glucose-Capillary: 53 mg/dL — ABNORMAL LOW (ref 70–99)
Glucose-Capillary: 61 mg/dL — ABNORMAL LOW (ref 70–99)
Glucose-Capillary: 110 mg/dL — ABNORMAL HIGH (ref 70–99)
Glucose-Capillary: 72 mg/dL (ref 70–99)
Glucose-Capillary: 110 mg/dL — ABNORMAL HIGH (ref 70–99)
Glucose-Capillary: 107 mg/dL — ABNORMAL HIGH (ref 70–99)

## 2024-10-11 LAB — CBC
HCT: 40.8 % (ref 39.0–52.0)
Hemoglobin: 13.6 g/dL (ref 13.0–17.0)
MCH: 29.3 pg (ref 26.0–34.0)
MCHC: 33.3 g/dL (ref 30.0–36.0)
MCV: 87.9 fL (ref 80.0–100.0)
Platelets: 102 K/uL — ABNORMAL LOW (ref 150–400)
RBC: 4.64 MIL/uL (ref 4.22–5.81)
RDW: 13.4 % (ref 11.5–15.5)
WBC: 23.4 K/uL — ABNORMAL HIGH (ref 4.0–10.5)
nRBC: 0 % (ref 0.0–0.2)

## 2024-10-11 LAB — PHOSPHORUS: Phosphorus: 3 mg/dL (ref 2.5–4.6)

## 2024-10-11 LAB — BASIC METABOLIC PANEL WITH GFR
Anion gap: 11 (ref 5–15)
BUN: 22 mg/dL (ref 8–23)
CO2: 21 mmol/L — ABNORMAL LOW (ref 22–32)
Calcium: 8 mg/dL — ABNORMAL LOW (ref 8.9–10.3)
Chloride: 109 mmol/L (ref 98–111)
Creatinine, Ser: 1.41 mg/dL — ABNORMAL HIGH (ref 0.61–1.24)
GFR, Estimated: 51 mL/min — ABNORMAL LOW (ref >60)
Glucose, Bld: 88 mg/dL (ref 70–99)
Potassium: 4.1 mmol/L (ref 3.5–5.1)
Sodium: 141 mmol/L (ref 135–145)

## 2024-10-11 LAB — FOLATE: Folate: 4.1 ng/mL — ABNORMAL LOW (ref >5.9)

## 2024-10-11 LAB — MAGNESIUM: Magnesium: 1.5 mg/dL — ABNORMAL LOW (ref 1.7–2.4)

## 2024-10-11 MED ORDER — MAGNESIUM SULFATE 2 GM/50ML IV SOLN
2.0000 g | Freq: Once | INTRAVENOUS | Status: AC
  Administered 2024-10-11: 2 g via INTRAVENOUS
  Filled 2024-10-11: qty 50

## 2024-10-11 MED ORDER — OXYCODONE HCL 5 MG PO TABS
5.0000 mg | ORAL_TABLET | Freq: Four times a day (QID) | ORAL | Status: DC | PRN
Start: 2024-10-11 — End: 2024-10-13
  Administered 2024-10-11 – 2024-10-12 (×3): 5 mg via ORAL
  Filled 2024-10-11 (×3): qty 1

## 2024-10-11 MED ORDER — SODIUM CHLORIDE 0.9 % IV SOLN: INTRAVENOUS | Status: AC

## 2024-10-11 MED ORDER — FINASTERIDE 5 MG PO TABS
5.0000 mg | ORAL_TABLET | Freq: Every day | ORAL | Status: DC
  Administered 2024-10-11 – 2024-10-13 (×3): 5 mg via ORAL
  Filled 2024-10-11 (×3): qty 1

## 2024-10-11 NOTE — TOC Initial Note (Signed)
 Transition of Care (TOC) - Initial/Assessment Note    Patient Details  Name: Bruce Wyatt. MRN: [id number] Date of Birth: 1946/05/26  Transition of Care [facility]) CM/SW Contact:    Bruce JONETTA Hamilton, RN Phone Number: 10/11/2024, 4:36 PM  Clinical Narrative:                  Met with patient to discuss discharge planning and therapy recommendations of HH PT. Patient provided with list of [facility] agency options. Patient verbalized he is independent at baseline and isn't sure if he Bruce Wyatt accept [facility] PT. Patient verbalized he has a lot of friends that have therapy and he may possibly just arrange PT on his own.  Patient verbalized his spouse Bruce Wyatt Bruce Wyatt transport him home once he is medically ready for discharge. Advised patient TOC Bruce Wyatt follow up to learn of his [facility] PT decision, patient verbalized agreement to this.   Expected Discharge Plan: Home w Home Health Services Barriers to Discharge: Continued Medical Work up   Patient Goals and CMS Choice            Expected Discharge Plan and Services       Living arrangements for the past 2 months: Single Family Home                                      Prior Living Arrangements/Services Living arrangements for the past 2 months: Single Family Home Lives with:: Spouse Patient language and need for interpreter reviewed:: Yes Do you feel safe going back to the place where you live?: Yes      Need for Family Participation in Patient Care: No (Comment) Care giver support system in place?: Yes (comment)   Criminal Activity/Legal Involvement Pertinent to Current Situation/Hospitalization: No - Comment as needed  Activities of Daily Living   ADL Screening (condition at time of admission) Independently performs ADLs?: Yes (appropriate for developmental age) Is the patient deaf or have difficulty hearing?: No Does the patient have difficulty seeing, even when wearing glasses/contacts?: No Does the patient have difficulty  concentrating, remembering, or making decisions?: No  Permission Sought/Granted                  Emotional Assessment Appearance:: Appears stated age, Well-Groomed Attitude/Demeanor/Rapport: Engaged Affect (typically observed): Accepting Orientation: : Oriented to Self, Oriented to Place, Oriented to  Time, Oriented to Situation Alcohol / Substance Use: Not Applicable Psych Involvement: No (comment)  Admission diagnosis:  Sepsis (HCC) [A41.9] Sepsis, due to unspecified organism, unspecified whether acute organ dysfunction present [facility]) [A41.9] Patient Active Problem List   Diagnosis Date Noted   Sepsis secondary to UTI (HCC) 10/10/2024   Sepsis (HCC) 10/10/2024   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 [place] [address] Phone: [phone] Fax: [phone]     Social Drivers of Health (SDOH) Social History: SDOH Screenings   Food Insecurity: No Food Insecurity (10/10/2024)  Housing: Low Risk  (10/10/2024)  Transportation Needs: No Transportation  Needs (10/10/2024)  Utilities: Not At Risk (10/10/2024)  Depression (PHQ2-9): Low Risk  (07/10/2024)  Social Connections: Socially Integrated (10/10/2024)  Tobacco Use: Low Risk  (10/10/2024)   SDOH Interventions:     Readmission Risk Interventions     No data to display

## 2024-10-11 NOTE — Progress Notes (Signed)
 Mobility Specialist - Progress Note   10/11/24 1052  Mobility  Activity Pivoted/transferred from chair to bed  Level of Assistance Contact guard assist, steadying assist  Assistive Device None  Distance Ambulated (ft) 3 ft  Activity Response Tolerated well  Mobility visit 1 Mobility  Mobility Specialist Start Time (ACUTE ONLY) 0953  Mobility Specialist Stop Time (ACUTE ONLY) 1001  Mobility Specialist Time Calculation (min) (ACUTE ONLY) 8 min   Pt transferred chair to bed, via SPT-- requiring MinA for trunk support upon sitting. Pt left supine with alarm set and needs within reach.  America Silvan Mobility Specialist 10/11/24 10:55 AM

## 2024-10-11 NOTE — Progress Notes (Signed)
 [facility] Progress Note  Patient: Bruce Wyatt.    FMW:[id number]  DOA: 10/10/2024     Date of Service: the patient was seen and examined on 10/11/2024  Chief Complaint  Patient presents with   Fever    PT to ER via EMS from home for c/o nausea, vomiting, and fever x 24 hours   Brief hospital course:  [place]. is a 78 y.o. male with medical history significant of poorly controlled BPH, IIDM, HTN, HLD, Parkinson's disease, presented with multiple complaints including suprapubic abdominal pain, dysuria, fever and chills. Symptoms started 3 days ago patient started develop urinary frequency, burning sensation while urinating and suprapubic sharp like abdominal pain, and subjective fever and chills.  ED Course: Temperature 101.9, borderline tachycardia blood pressure 130/90 O2 saturation 96% on room air.  UA showed 3+ WBC, 2+ RBC, lactic acid 5.0> 5.6, BUN 17 creatinine 1.0 WBC 2.8 hemoglobin 15.   Patient was given IV bolus and started on broad-spectrum antibiotics including cefepime and vancomycin.    Assessment and Plan:  # Septic shock due to UTI, lactic acid 5.6 on admission Sepsis criteria: Fever, leukopenia, elevated lactic acid, source of infection, and UTI Lactic acid trending down - Continue IV fluid - Patient received IV bolus in the ED, de-escalate antibiotic coverage to ceftriaxone.   Urine culture pending    # Urinary retention secondary to BPH - Continue Flomax Started Proscar Foley catheter inserted and 650 mL urine was collected D/w urology, recommended to keep Foley for 7 days and follow-up as an outpatient for    # Parkinson's disease - Continue Sinemet   # IIDM - Hold off metformin  - SSI for now   Body mass index is 26.96 kg/m.  Interventions:  Diet: Carb modified diet DVT Prophylaxis: Subcutaneous Lovenox   Advance goals of care discussion: Full code  Family Communication: family was present at bedside, at the time  of interview.  The pt provided permission to discuss medical plan with the family. Opportunity was given to ask question and all questions were answered satisfactorily.   Disposition:  Pt is from Home, admitted with sepsis, UTI, still on IV antibiotic, which precludes a safe discharge. Discharge to SNF, when stable, may need few days to improve.  Subjective: No significant events overnight, patient feels generalized weakness, and complaining of backache.  No any other complaints.  Physical Exam: General: NAD, lying comfortably Appear in no distress, affect appropriate Eyes: PERRLA ENT: Oral Mucosa Clear, moist  Neck: no JVD,  Cardiovascular: S1 and S2 Present, no Murmur,  Respiratory: good respiratory effort, Bilateral Air entry equal and Decreased, no Crackles, no wheezes Abdomen: Bowel Sound present, Soft and no tenderness,  Skin: no rashes Extremities: no Pedal edema, no calf tenderness Neurologic: without any new focal findings Gait not checked due to patient safety concerns  Vitals:   10/10/24 2132 10/10/24 2300 10/11/24 0451 10/11/24 0746  BP: (!) 86/47 (!) 93/57 (!) 109/94 113/60  Pulse: 74 76 78 81  Resp:   18 18  Temp:   (!) 97.4 F (36.3 C) 98 F (36.7 C)  TempSrc:   Oral Oral  SpO2:   97% 95%  Weight:      Height:        Intake/Output Summary (Last 24 hours) at 10/11/2024 1456 Last data filed at 10/11/2024 1409 Gross per 24 hour  Intake 836.54 ml  Output 700 ml  Net 136.54 ml   Filed Weights   10/10/24 1200  Weight: 95.3 kg    Data Reviewed: I have personally reviewed and interpreted daily labs, tele strips, imagings as discussed above. I reviewed all nursing notes, pharmacy notes, vitals, pertinent old records I have discussed plan of care as described above with RN and patient/family.  CBC: Recent Labs  Lab 10/10/24 1211 10/11/24 0603  WBC 2.8* 23.4*  NEUTROABS 2.7  --   HGB 15.0 13.6  HCT 44.8 40.8  MCV 87.8 87.9  PLT 126* 102*   Basic  Metabolic Panel: Recent Labs  Lab 10/10/24 1211 10/11/24 0603  NA 144 141  K 3.7 4.1  CL 108 109  CO2 21* 21*  GLUCOSE 96 88  BUN 17 22  CREATININE 1.14 1.41*  CALCIUM  9.2 8.0*  MG  --  1.5*  PHOS  --  3.0    Studies: CT ABDOMEN PELVIS W CONTRAST Result Date: 10/10/2024 CLINICAL DATA:  Abdominal pain. EXAM: CT ABDOMEN AND PELVIS WITH CONTRAST TECHNIQUE: Multidetector CT imaging of the abdomen and pelvis was performed using the standard protocol following bolus administration of intravenous contrast. RADIATION DOSE REDUCTION: This exam was performed according to the departmental dose-optimization program which includes automated exposure control, adjustment of the mA and/or kV according to patient size and/or use of iterative reconstruction technique. CONTRAST:  100mL OMNIPAQUE IOHEXOL 300 MG/ML  SOLN COMPARISON:  CT abdomen pelvis dated 07/03/2012. FINDINGS: Evaluation is limited due to streak artifact caused by patient's arms and overlying support wires. Lower chest: The visualized lung bases are clear. There is coronary vascular calcification. Pericardial effusion measures 9 mm in thickness anterior to the heart. No intra-abdominal free air or free fluid. Hepatobiliary: The liver is unremarkable. No biliary ductal dilatation. Multiple gallstones. No pericholecystic fluid or evidence of acute cholecystitis by CT. Pancreas: Unremarkable. No pancreatic ductal dilatation or surrounding inflammatory changes. Spleen: Normal in size without focal abnormality. Adrenals/Urinary Tract: The adrenal glands unremarkable. Nonobstructing renal calculi in the upper pole of the right kidney and inferior pole of the left kidney measure up to 2 mm. There is no hydronephrosis on either side. There is symmetric enhancement and excretion of contrast by both kidneys. The visualized ureters appear unremarkable. Diffusely thickened and trabeculated appearance of the bladder wall suggestive of chronic bladder outlet  obstruction and infection. Correlation with urinalysis recommended to evaluate for possibility of acute on chronic cystitis. Stomach/Bowel: There is mild sigmoid diverticulosis. There is no bowel obstruction or active inflammation. The appendix is normal. Vascular/Lymphatic: Mild aortoiliac atherosclerotic disease. The IVC is unremarkable. No portal venous gas. There is no adenopathy. Reproductive: Enlarged prostate gland measuring 6 cm in transverse axial diameter. Other: None Musculoskeletal: Osteopenia with degenerative changes spine. No acute osseous pathology. IMPRESSION: 1. Cholelithiasis. 2. Small nonobstructing bilateral renal calculi. No hydronephrosis. 3. 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 Correlation with urinalysis recommended to evaluate for possibility of acute on chronic cystitis. 4. Enlarged prostate gland. 5.  Aortic Atherosclerosis (ICD10-I70.0). Electronically Signed   By: Vanetta Chou M.D.   On: 10/10/2024 15:21    Scheduled Meds:  carbidopa-levodopa  2 tablet Oral TID   enoxaparin (LOVENOX) injection  40 mg Subcutaneous QHS   finasteride  5 mg Oral Daily   Influenza vac split trivalent PF  0.5 mL Intramuscular Tomorrow-1000   insulin aspart  0-9 Units Subcutaneous TID WC   mirabegron  ER  25 mg Oral Daily   pantoprazole  40 mg Oral BID   rosuvastatin   40  mg Oral QHS   tamsulosin  0.4 mg Oral Daily   venlafaxine  XR  75 mg Oral Daily   zolpidem   10 mg Oral QHS   Continuous Infusions:  sodium chloride  100 mL/hr at 10/11/24 0853   cefTRIAXone (ROCEPHIN)  IV 2 g (10/10/24 2132)   magnesium  sulfate bolus IVPB 2 g (10/11/24 1413)   PRN Meds: acetaminophen **OR** acetaminophen, ondansetron **OR** ondansetron (ZOFRAN) IV, oxyCODONE  Time spent: 55 minutes  Author: ELVAN SOR. MD [facility] 10/11/2024 2:56 PM  To reach On-call, see care teams to locate the attending and  reach out to them via [email or web address]. If 7PM-7AM, please contact night-coverage If you still have difficulty reaching the attending provider, please page the [facility] (Director on Call) for [facility] on amion for assistance.

## 2024-10-11 NOTE — TOC CM/SW Note (Signed)
 Transition of Care [facility]) - Inpatient Brief Assessment   Patient Details  Name: Bruce Wyatt. MRN: [id number] Date of Birth: 10-02-1946  Transition of Care [facility]) CM/SW Contact:    Daved JONETTA Hamilton, RN Phone Number: 10/11/2024, 10:31 AM   Clinical Narrative:   Transition of Care [facility]) Screening Note   Patient Details  Name: Bruce Wyatt. Date of Birth: 26-Jul-1946   Transition of Care [facility]) CM/SW Contact:    Daved JONETTA Hamilton, RN Phone Number: 10/11/2024, 10:31 AM    Transition of Care Department [facility]) has reviewed patient and no TOC needs have been identified at this time. If new patient transition needs arise, please place a TOC consult.    Transition of Care Asessment: Insurance and Status: Insurance coverage has been reviewed Patient has primary care physician: Yes   Prior level of function:: independent Prior/Current Home Services: No current home services Social Drivers of Health Review: SDOH reviewed no interventions necessary Readmission risk has been reviewed: Yes Transition of care needs: no transition of care needs at this time

## 2024-10-11 NOTE — Evaluation (Addendum)
 Physical Therapy Evaluation Patient Details Name: Aashrith Eves. MRN: [id number] DOB: [birth year] Today's Date: 10/11/2024  History of Present Illness  Crystian Frith. is a 78 y.o. male with medical history significant of poorly controlled BPH, IIDM, HTN, HLD, Parkinson's disease, presented with multiple complaints including suprapubic abdominal pain, dysuria, fever and chills.  Clinical Impression  Pt is pleasant 78 y.o. male patient admitted for sepsis. Prior to hospitalization pt reports independence with amb and ADLs without use of AD. Pt required mod A for supine>sit for trunk elevation. Pt able to amb 40 ft and perform transfers with CGA. Pt demonstrates poor eccentric control with sitting into recliner requiring min A to control. Cueing provided for hand placement and technique. HR up to 86 bpm with amb. Pt demonstrates deficits in strength/balance/activity tolerance. Would benefit from skilled PT to address above deficits and promote optimal return to PLOF.         If plan is discharge home, recommend the following: A little help with walking and/or transfers;Supervision due to cognitive status;A little help with bathing/dressing/bathroom   Can travel by private vehicle        Equipment Recommendations None recommended by PT  Recommendations for Other Services       Functional Status Assessment Patient has had a recent decline in their functional status and demonstrates the ability to make significant improvements in function in a reasonable and predictable amount of time.     Precautions / Restrictions Precautions Precautions: Fall Recall of Precautions/Restrictions: Intact Restrictions Weight Bearing Restrictions Per Provider Order: No      Mobility  Bed Mobility Overal bed mobility: Needs Assistance Bed Mobility: Supine to Sit     Supine to sit: Mod assist     General bed mobility comments: mod A for trunk elevation. VCs for hand placement and  sequencing.    Transfers Overall transfer level: Needs assistance Equipment used: None Transfers: Sit to/from Stand, Bed to chair/wheelchair/BSC Sit to Stand: Min assist   Step pivot transfers: Contact guard assist       General transfer comment: Min A for STS for lift from lowest bed height and for eccentric phase of sitting. Cuing for hand placement.    Ambulation/Gait Ambulation/Gait assistance: Contact guard assist Gait Distance (Feet): 40 Feet Assistive device: None Gait Pattern/deviations: Shuffle, Decreased step length - right, Decreased step length - left, Trunk flexed Gait velocity: dec     General Gait Details: no LOB with amb, however does amb using shuffled gait.  Stairs            Wheelchair Mobility     Tilt Bed    Modified Rankin (Stroke Patients Only)       Balance Overall balance assessment: Needs assistance Sitting-balance support: Feet supported, Bilateral upper extremity supported Sitting balance-Leahy Scale: Good Sitting balance - Comments: able to maintian setaed balance at EOB. Tremors d/t parkinson's disease   Standing balance support: No upper extremity supported, During functional activity Standing balance-Leahy Scale: Fair Standing balance comment: no LOB with static stand                             Pertinent Vitals/Pain Pain Assessment Pain Assessment: No/denies pain    Home Living Family/patient expects to be discharged to:: Private residence Living Arrangements: Spouse/significant other Available Help at Discharge: Family Type of Home: House Home Access: Stairs to enter Entrance Stairs-Rails: Left Entrance Stairs-Number of Steps: 3-4   Home  Layout: One level Home Equipment: Shower seat - built in      Prior Function Prior Level of Function : Independent/Modified Independent             Mobility Comments: independent no AD ADLs Comments: IND     Extremity/Trunk Assessment   Upper Extremity  Assessment Upper Extremity Assessment: Generalized weakness    Lower Extremity Assessment Lower Extremity Assessment: Generalized weakness    Cervical / Trunk Assessment Cervical / Trunk Assessment: Kyphotic  Communication   Communication Communication: No apparent difficulties    Cognition Arousal: Alert Behavior During Therapy: WFL for tasks assessed/performed   PT - Cognitive impairments: No apparent impairments                       PT - Cognition Comments: pleasant and agreeable Following commands: Intact       Cueing Cueing Techniques: Verbal cues, Visual cues     General Comments      Exercises     Assessment/Plan    PT Assessment Patient needs continued PT services  PT Problem List Decreased strength;Decreased balance;Decreased activity tolerance;Decreased mobility;Decreased coordination       PT Treatment Interventions DME instruction;Gait training;Stair training;Functional mobility training;Therapeutic activities;Therapeutic exercise;Balance training;Neuromuscular re-education;Patient/family education    PT Goals (Current goals can be found in the Care Plan section)  Acute Rehab PT Goals Patient Stated Goal: to move better PT Goal Formulation: With patient Time For Goal Achievement: 10/25/24 Potential to Achieve Goals: Good    Frequency Min 1X/week     Co-evaluation               AM-PAC PT 6 Clicks Mobility  Outcome Measure Help needed turning from your back to your side while in a flat bed without using bedrails?: A Little Help needed moving from lying on your back to sitting on the side of a flat bed without using bedrails?: A Little Help needed moving to and from a bed to a chair (including a wheelchair)?: A Little Help needed standing up from a chair using your arms (e.g., wheelchair or bedside chair)?: A Little Help needed to walk in hospital room?: A Little Help needed climbing 3-5 steps with a railing? : A Little 6  Click Score: 18    End of Session Equipment Utilized During Treatment: Gait belt Activity Tolerance: Patient tolerated treatment well Patient left: in chair;with call bell/phone within reach Nurse Communication: Mobility status PT Visit Diagnosis: Unsteadiness on feet (R26.81);Muscle weakness (generalized) (M62.81)    Time: 9070-9052 PT Time Calculation (min) (ACUTE ONLY): 18 min   Charges:                 Nansi Birmingham, SPT   Dearius Hoffmann 10/11/2024, 12:07 PM

## 2024-10-12 DIAGNOSIS — A419 Sepsis, unspecified organism: Secondary | ICD-10-CM | POA: Diagnosis not present

## 2024-10-12 LAB — URINE CULTURE: Culture: >=100000 — AB

## 2024-10-12 LAB — BASIC METABOLIC PANEL WITH GFR
Anion gap: 8 (ref 5–15)
BUN: 22 mg/dL (ref 8–23)
CO2: 22 mmol/L (ref 22–32)
Calcium: 7.8 mg/dL — ABNORMAL LOW (ref 8.9–10.3)
Chloride: 109 mmol/L (ref 98–111)
Creatinine, Ser: 1.03 mg/dL (ref 0.61–1.24)
GFR, Estimated: >60 mL/min (ref >60)
Glucose, Bld: 93 mg/dL (ref 70–99)
Potassium: 4.3 mmol/L (ref 3.5–5.1)
Sodium: 139 mmol/L (ref 135–145)

## 2024-10-12 LAB — CBC
HCT: 36.8 % — ABNORMAL LOW (ref 39.0–52.0)
Hemoglobin: 12.5 g/dL — ABNORMAL LOW (ref 13.0–17.0)
MCH: 29.8 pg (ref 26.0–34.0)
MCHC: 34 g/dL (ref 30.0–36.0)
MCV: 87.6 fL (ref 80.0–100.0)
Platelets: 73 K/uL — ABNORMAL LOW (ref 150–400)
RBC: 4.2 MIL/uL — ABNORMAL LOW (ref 4.22–5.81)
RDW: 13.3 % (ref 11.5–15.5)
WBC: 13.5 K/uL — ABNORMAL HIGH (ref 4.0–10.5)
nRBC: 0 % (ref 0.0–0.2)

## 2024-10-12 LAB — GLUCOSE, CAPILLARY
Glucose-Capillary: 81 mg/dL (ref 70–99)
Glucose-Capillary: 104 mg/dL — ABNORMAL HIGH (ref 70–99)
Glucose-Capillary: 110 mg/dL — ABNORMAL HIGH (ref 70–99)
Glucose-Capillary: 127 mg/dL — ABNORMAL HIGH (ref 70–99)

## 2024-10-12 LAB — PHOSPHORUS: Phosphorus: 2.8 mg/dL (ref 2.5–4.6)

## 2024-10-12 LAB — MAGNESIUM: Magnesium: 2.2 mg/dL (ref 1.7–2.4)

## 2024-10-12 LAB — VITAMIN D 25 HYDROXY (VIT D DEFICIENCY, FRACTURES): Vit D, 25-Hydroxy: 32.22 ng/mL (ref 30–100)

## 2024-10-12 MED ORDER — FOLIC ACID 1 MG PO TABS
1.0000 mg | ORAL_TABLET | Freq: Every day | ORAL | Status: DC
  Administered 2024-10-12: 1 mg via ORAL
  Filled 2024-10-12: qty 1

## 2024-10-12 MED ORDER — AMOXICILLIN 500 MG PO CAPS: 1000.0000 mg | ORAL_CAPSULE | Freq: Three times a day (TID) | ORAL | Status: DC

## 2024-10-12 MED ORDER — AMOXICILLIN 500 MG PO CAPS
1000.0000 mg | ORAL_CAPSULE | Freq: Three times a day (TID) | ORAL | Status: DC
  Administered 2024-10-12 – 2024-10-13 (×3): 1000 mg via ORAL
  Filled 2024-10-12 (×4): qty 2

## 2024-10-12 MED ORDER — CHLORHEXIDINE GLUCONATE CLOTH 2 % EX PADS
6.0000 | MEDICATED_PAD | Freq: Every day | CUTANEOUS | Status: DC
  Administered 2024-10-13: 6 via TOPICAL

## 2024-10-12 NOTE — Progress Notes (Signed)
 [facility] Progress Note  Patient: Bruce Wyatt.    FMW:[id number]  DOA: 10/10/2024     Date of Service: the patient was seen and examined on 10/12/2024  Chief Complaint  Patient presents with   Fever    PT to ER via EMS from home for c/o nausea, vomiting, and fever x 24 hours   Brief hospital course:  Bruce Wolke. is a 78 y.o. male with medical history significant of poorly controlled BPH, IIDM, HTN, HLD, Parkinson's disease, presented with multiple complaints including suprapubic abdominal pain, dysuria, fever and chills. Symptoms started 3 days ago patient started develop urinary frequency, burning sensation while urinating and suprapubic sharp like abdominal pain, and subjective fever and chills.  ED Course: Temperature 101.9, borderline tachycardia blood pressure 130/90 O2 saturation 96% on room air.  UA showed 3+ WBC, 2+ RBC, lactic acid 5.0> 5.6, BUN 17 creatinine 1.0 WBC 2.8 hemoglobin 15.   Patient was given IV bolus and started on broad-spectrum antibiotics including cefepime and vancomycin.    Assessment and Plan:  # Septic shock due to UTI, lactic acid 5.6 on admission Sepsis criteria: Fever, leukopenia, elevated lactic acid, source of infection, and UTI Lactic acid trending down - Continue IV fluid - Patient received IV bolus in the ED, de-escalate antibiotic coverage to ceftriaxone.   Urine culture growing Enterococcus faecalis, pansensitive. 11/25 DC'd ceftriaxone and started amoxicillin  1 g p.o. 3 times daily x 14 days  AKI due to urinary retention Foley catheter was inserted sCr 1.4 > 1.03   # Urinary retention secondary to BPH - Continue Flomax Started Proscar Foley catheter inserted and 650 mL urine was collected D/w urology, recommended to keep Foley for 7 days and follow-up as an outpatient for  # Hypomagnesemia, mag repleted and resolved. Monitor electrolytes replete as needed.  # Parkinson's disease - Continue Sinemet   #  IIDM - Hold off metformin  - SSI for now  # Folic acid  deficiency, folic acid  level 4.1.  Started folic acid  1 mg p.o. daily.  Follow-up with PCP to repeat folic acid  level after 3 to 6 months.   Body mass index is 26.96 kg/m.  Interventions:  Diet: Carb modified diet DVT Prophylaxis: Subcutaneous Lovenox   Advance goals of care discussion: Full code  Family Communication: family was present at bedside, at the time of interview.  The pt provided permission to discuss medical plan with the family. Opportunity was given to ask question and all questions were answered satisfactorily.   Disposition:  Pt is from Home, admitted with sepsis, UTI, still on IV antibiotic, which precludes a safe discharge. Discharge to SNF, when stable, may need few days to improve.  Subjective: No significant events overnight, denied any abdominal pain, no chest pain or pressure, no shortness of breath.  Laying comfortably in the bed. Patient's wife at bedside, patient is motivated to work with physical therapy, Discussed SNF placement if patient is not able to ambulate with walker   Physical Exam: General: NAD, lying comfortably Appear in no distress, affect appropriate Eyes: PERRLA ENT: Oral Mucosa Clear, moist  Neck: no JVD,  Cardiovascular: S1 and S2 Present, no Murmur,  Respiratory: good respiratory effort, Bilateral Air entry equal and Decreased, no Crackles, no wheezes Abdomen: Bowel Sound present, Soft and no tenderness,  Skin: no rashes Extremities: no Pedal edema, no calf tenderness Neurologic: without any new focal findings Gait not checked due to patient safety concerns  Vitals:   10/11/24 1938 10/12/24 0216  10/12/24 0719 10/12/24 1446  BP: (!) 110/59 (!) 105/59 122/67 114/63  Pulse: 81 78 68 64  Resp: 18 18 18 18   Temp: 98.3 F (36.8 C) 98.1 F (36.7 C) 98 F (36.7 C) 98 F (36.7 C)  TempSrc: Oral Oral Oral Oral  SpO2: 95% 92% 95% 97%  Weight:      Height:         Intake/Output Summary (Last 24 hours) at 10/12/2024 1605 Last data filed at 10/12/2024 1546 Gross per 24 hour  Intake 400 ml  Output 1950 ml  Net -1550 ml   Filed Weights   10/10/24 1200  Weight: 95.3 kg    Data Reviewed: I have personally reviewed and interpreted daily labs, tele strips, imagings as discussed above. I reviewed all nursing notes, pharmacy notes, vitals, pertinent old records I have discussed plan of care as described above with RN and patient/family.  CBC: Recent Labs  Lab 10/10/24 1211 10/11/24 0603 10/12/24 0700  WBC 2.8* 23.4* 13.5*  NEUTROABS 2.7  --   --   HGB 15.0 13.6 12.5*  HCT 44.8 40.8 36.8*  MCV 87.8 87.9 87.6  PLT 126* 102* 73*   Basic Metabolic Panel: Recent Labs  Lab 10/10/24 1211 10/11/24 0603 10/12/24 0700  NA 144 141 139  K 3.7 4.1 4.3  CL 108 109 109  CO2 21* 21* 22  GLUCOSE 96 88 93  BUN 17 22 22   CREATININE 1.14 1.41* 1.03  CALCIUM  9.2 8.0* 7.8*  MG  --  1.5* 2.2  PHOS  --  3.0 2.8    Studies: No results found.   Scheduled Meds:  amoxicillin   1,000 mg Oral Q8H   carbidopa-levodopa  2 tablet Oral TID   enoxaparin (LOVENOX) injection  40 mg Subcutaneous QHS   finasteride  5 mg Oral Daily   folic acid   1 mg Oral Daily   Influenza vac split trivalent PF  0.5 mL Intramuscular Tomorrow-1000   insulin aspart  0-9 Units Subcutaneous TID WC   mirabegron  ER  25 mg Oral Daily   pantoprazole  40 mg Oral BID   rosuvastatin   40 mg Oral QHS   tamsulosin  0.4 mg Oral Daily   venlafaxine  XR  75 mg Oral Daily   zolpidem   10 mg Oral QHS   Continuous Infusions:   PRN Meds: acetaminophen **OR** acetaminophen, ondansetron **OR** ondansetron (ZOFRAN) IV, oxyCODONE  Time spent: 40 minutes  Author: ELVAN Wyatt. MD [facility] 10/12/2024 4:05 PM  To reach On-call, see care teams to locate the attending and reach out to them via [email or web address]. If 7PM-7AM, please contact night-coverage If you still have difficulty  reaching the attending provider, please page the [facility] (Director on Call) for [facility] on amion for assistance.

## 2024-10-12 NOTE — Evaluation (Signed)
 Occupational Therapy Evaluation Patient Details Name: Bruce Wyatt. MRN: [id number] DOB: [birth year] Today's Date: 10/12/2024   History of Present Illness   Daquane Aguilar. is a 78 y.o. male with medical history significant of poorly controlled BPH, IIDM, HTN, HLD, Parkinson's disease, presented with multiple complaints including suprapubic abdominal pain, dysuria, fever and chills.     Clinical Impressions Mr. Capshaw was seen for OT evaluation this date. Prior to hospital admission, pt was generally independent with ADL management. He endorses doing yard work, driving, and getting out in the community regularly. Pt lives with his spouse in a 1 level home with 4 semi-steps (recently shortened from 3 steps to 4) to enter the home. Pt presents with deficits in strength, activity tolerance, safety awareness and decreased LB access, affecting safe and optimal ADL completion. Pt currently requires SUPERVISION for LB ADL management from STS and functional transfers.  Pt would benefit from skilled OT services to address noted impairments and functional limitations (see below for any additional details) in order to maximize safety and independence while minimizing future risk of falls, injury, and readmission. Anticipate the need for follow up OT services in the home setting upon acute hospital DC.      If plan is discharge home, recommend the following:   A little help with walking and/or transfers;A little help with bathing/dressing/bathroom     Functional Status Assessment   Patient has had a recent decline in their functional status and demonstrates the ability to make significant improvements in function in a reasonable and predictable amount of time.     Equipment Recommendations   BSC/3in1     Recommendations for Other Services         Precautions/Restrictions   Precautions Precautions: Fall Recall of Precautions/Restrictions:  Intact Restrictions Weight Bearing Restrictions Per Provider Order: No     Mobility Bed Mobility Overal bed mobility: Needs Assistance Bed Mobility: Supine to Sit     Supine to sit: Supervision, HOB elevated, Used rails          Transfers Overall transfer level: Needs assistance Equipment used: 1 person hand held assist Transfers: Sit to/from Stand Sit to Stand: Supervision, Contact guard assist                  Balance Overall balance assessment: Needs assistance Sitting-balance support: Feet supported, No upper extremity supported Sitting balance-Leahy Scale: Good     Standing balance support: During functional activity, Single extremity supported Standing balance-Leahy Scale: Fair Standing balance comment: no LOB with static stand                           ADL either performed or assessed with clinical judgement   ADL Overall ADL's : Needs assistance/impaired                                     Functional mobility during ADLs: Set up;Supervision/safety General ADL Comments: Able to don bilat socks while seated EOB with mod increased time/effort to perform. LB access somewhat limited by foley catheter and discomfort this date. SUPERVISION for bed mobility and STS t/fs with SBA for mgt of lines/leads. No physical assist required for pt to amb around bed to room recliner. Anticipate SUPERVISION for toilet transfer/toileting.     Vision Patient Visual Report: No change from baseline       Perception  Praxis         Pertinent Vitals/Pain Pain Assessment Pain Assessment: No/denies pain     Extremity/Trunk Assessment Upper Extremity Assessment Upper Extremity Assessment: Generalized weakness   Lower Extremity Assessment Lower Extremity Assessment: Generalized weakness   Cervical / Trunk Assessment Cervical / Trunk Assessment: Kyphotic   Communication Communication Communication: No apparent difficulties    Cognition Arousal: Alert                                   Following commands: Intact       Cueing  General Comments   Cueing Techniques: Verbal cues;Visual cues      Exercises Other Exercises Other Exercises: Pt educated on role of OT in acute setting, safety, falls prevention strategies for home and hospital , & DC recs.   Shoulder Instructions      Home Living Family/patient expects to be discharged to:: Private residence Living Arrangements: Spouse/significant other Available Help at Discharge: Family Type of Home: House Home Access: Stairs to enter Secretary/administrator of Steps: 4 semi-steps Entrance Stairs-Rails: Left Home Layout: One level     Bathroom Shower/Tub: Walk-in shower         Home Equipment: Shower seat - built in;Hand held shower head          Prior Functioning/Environment Prior Level of Function : Independent/Modified Independent             Mobility Comments: independent no AD ADLs Comments: IND, meets with his coffee crowd daily for coffee.    OT Problem List: Decreased strength;Decreased coordination;Decreased range of motion;Decreased activity tolerance;Decreased safety awareness;Impaired balance (sitting and/or standing);Decreased knowledge of use of DME or AE   OT Treatment/Interventions: Self-care/ADL training;Therapeutic exercise;Therapeutic activities;DME and/or AE instruction;Patient/family education;Balance training;Energy conservation      OT Goals(Current goals can be found in the care plan section)   Acute Rehab OT Goals Patient Stated Goal: To go home OT Goal Formulation: With patient Time For Goal Achievement: 10/26/24 Potential to Achieve Goals: Good ADL Goals Pt Will Perform Grooming: standing;Independently Pt Will Perform Lower Body Dressing: with modified independence;sit to/from stand;with adaptive equipment Pt Will Transfer to Toilet: ambulating;regular height toilet;with modified  independence Pt Will Perform Toileting - Clothing Manipulation and hygiene: sit to/from stand;with modified independence;with adaptive equipment   OT Frequency:  Min 2X/week    Co-evaluation              AM-PAC OT 6 Clicks Daily Activity     Outcome Measure Help from another person eating meals?: None Help from another person taking care of personal grooming?: A Little Help from another person toileting, which includes using toliet, bedpan, or urinal?: A Little Help from another person bathing (including washing, rinsing, drying)?: A Little Help from another person to put on and taking off regular upper body clothing?: A Little Help from another person to put on and taking off regular lower body clothing?: A Little 6 Click Score: 19   End of Session Equipment Utilized During Treatment: Gait belt;Rolling walker (2 wheels)  Activity Tolerance: Patient tolerated treatment well Patient left: in chair;with call bell/phone within reach;with chair alarm set  OT Visit Diagnosis: Other abnormalities of gait and mobility (R26.89);Muscle weakness (generalized) (M62.81)                Time: 8661-8641 OT Time Calculation (min): 20 min Charges:  OT General Charges $OT Visit: 1 Visit OT Evaluation $OT  Eval Moderate Complexity: 1 Mod OT Treatments $Self Care/Home Management : 8-22 mins  Jhonny Pelton, M.S., OTR/L 10/12/24, 3:03 PM

## 2024-10-12 NOTE — Plan of Care (Signed)

## 2024-10-13 ENCOUNTER — Other Ambulatory Visit: Payer: Self-pay

## 2024-10-13 DIAGNOSIS — G20B1 Parkinson's disease with dyskinesia, without mention of fluctuations: Secondary | ICD-10-CM | POA: Diagnosis not present

## 2024-10-13 DIAGNOSIS — F32A Depression, unspecified: Secondary | ICD-10-CM | POA: Diagnosis not present

## 2024-10-13 DIAGNOSIS — A419 Sepsis, unspecified organism: Secondary | ICD-10-CM | POA: Diagnosis not present

## 2024-10-13 DIAGNOSIS — N390 Urinary tract infection, site not specified: Secondary | ICD-10-CM | POA: Diagnosis not present

## 2024-10-13 LAB — GLUCOSE, CAPILLARY
Glucose-Capillary: 90 mg/dL (ref 70–99)
Glucose-Capillary: 104 mg/dL — ABNORMAL HIGH (ref 70–99)

## 2024-10-13 LAB — BASIC METABOLIC PANEL WITH GFR
Anion gap: 12 (ref 5–15)
BUN: 17 mg/dL (ref 8–23)
CO2: 22 mmol/L (ref 22–32)
Calcium: 8 mg/dL — ABNORMAL LOW (ref 8.9–10.3)
Chloride: 106 mmol/L (ref 98–111)
Creatinine, Ser: 0.9 mg/dL (ref 0.61–1.24)
GFR, Estimated: >60 mL/min (ref >60)
Glucose, Bld: 97 mg/dL (ref 70–99)
Potassium: 3.8 mmol/L (ref 3.5–5.1)
Sodium: 140 mmol/L (ref 135–145)

## 2024-10-13 LAB — CBC
HCT: 33.8 % — ABNORMAL LOW (ref 39.0–52.0)
Hemoglobin: 11.7 g/dL — ABNORMAL LOW (ref 13.0–17.0)
MCH: 29.8 pg (ref 26.0–34.0)
MCHC: 34.6 g/dL (ref 30.0–36.0)
MCV: 86.2 fL (ref 80.0–100.0)
Platelets: 61 K/uL — ABNORMAL LOW (ref 150–400)
RBC: 3.92 MIL/uL — ABNORMAL LOW (ref 4.22–5.81)
RDW: 13.3 % (ref 11.5–15.5)
WBC: 9.3 K/uL (ref 4.0–10.5)
nRBC: 0 % (ref 0.0–0.2)

## 2024-10-13 LAB — MAGNESIUM: Magnesium: 2 mg/dL (ref 1.7–2.4)

## 2024-10-13 LAB — PHOSPHORUS: Phosphorus: 2.1 mg/dL — ABNORMAL LOW (ref 2.5–4.6)

## 2024-10-13 MED ORDER — AMOXICILLIN 500 MG PO CAPS
500.0000 mg | ORAL_CAPSULE | Freq: Three times a day (TID) | ORAL | 0 refills | Status: AC
  Filled 2024-10-13: qty 21, 7d supply, fill #0

## 2024-10-13 MED ORDER — FINASTERIDE 5 MG PO TABS
5.0000 mg | ORAL_TABLET | Freq: Every day | ORAL | 0 refills | Status: AC
  Filled 2024-10-13: qty 30, 30d supply, fill #0

## 2024-10-13 NOTE — TOC Transition Note (Addendum)
 Transition of Care [facility]) - Discharge Note   Patient Details  Name: Bruce Wyatt. MRN: [id number] Date of Birth: 12-07-1946  Transition of Care [facility]) CM/SW Contact:  Victory Jackquline GORMAN, RN Phone Number: 10/13/2024, 12:18 PM   Clinical Narrative:   Pt discharging home with home health services. Received a secure chat from the MD stating that the patient has agreed to [facility] services and that he ordered PT, OT, & RN. Patient was accepted by [facility] and will be in touch with the patient to set up services. Patient's wife is at the bedside and will be transporting patient home. No further concerns. RNCM Signing off.    Final next level of care: Home w Home Health Services Barriers to Discharge: Barriers Resolved   Patient Goals and CMS Choice            Discharge Placement                       Discharge Plan and Services Additional resources added to the After Visit Summary for                            HH Arranged: PT, OT, RN [facility] Agency: [facility] Date [facility] Agency Contacted: 10/13/24   Representative spoke with at [facility] Agency: Referral sent via [facility] portal and patient was accepted by [facility]  Social Drivers of Health (SDOH) Interventions SDOH Screenings   Food Insecurity: No Food Insecurity (10/10/2024)  Housing: Low Risk  (10/10/2024)  Transportation Needs: No Transportation Needs (10/10/2024)  Utilities: Not At Risk (10/10/2024)  Depression (PHQ2-9): Low Risk  (07/10/2024)  Social Connections: Socially Integrated (10/10/2024)  Tobacco Use: Low Risk  (10/10/2024)     Readmission Risk Interventions     No data to display

## 2024-10-13 NOTE — Discharge Summary (Signed)
 Physician Discharge Summary   Patient: Bruce Wyatt. 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 male with medical history significant of poorly controlled BPH, IIDM, HTN, HLD, Parkinson's disease, presented with multiple complaints including suprapubic abdominal pain, dysuria, fever and chills. Symptoms started 3 days ago patient started develop urinary frequency, burning sensation while urinating and suprapubic sharp like abdominal pain, and subjective fever and chills.   ED Course: Temperature 101.9, borderline tachycardia blood pressure 130/90 O2 saturation 96% on room air.  UA showed 3+ WBC, 2+ RBC, lactic acid 5.0> 5.6, BUN 17 creatinine 1.0 WBC 2.8 hemoglobin 15.   Patient was given IV bolus and started on broad-spectrum antibiotics including cefepime and vancomycin.    # Septic shock due to UTI, lactic acid 5.6 on admission Sepsis criteria: Fever, leukopenia, elevated lactic acid, source of infection, and UTI Lactic acid trending down and improved - treated with IV Abx abd fluids. Urine culture growing Enterococcus faecalis, pansensitive. Being DCed on PO Abx   AKI due to urinary retention Foley catheter was inserted. Will need voiding trial at Dr Twylla office sCr 1.4 > 1.03    # Urinary retention secondary to BPH - Continue Finesteride Foley catheter inserted and 650 mL urine was collected D/w urology, recommended to keep Foley for 7 days and follow-up as an outpatient for   # Hypomagnesemia, mag repleted and resolved.   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  Started folic acid  1 mg p.o. daily. He will buy OTC. Follow-up with PCP to repeat folic acid  level after 3 to 6 months.           Disposition: Home health Diet recommendation:  Discharge Diet Orders (From admission, onward)     Start     Ordered   10/13/24 0000  Diet - low sodium heart healthy        10/13/24 1105           Carb modified diet DISCHARGE MEDICATION: Allergies as of 10/13/2024       Reactions   Ace Inhibitors    COUGH        Medication List     STOP taking these medications    mupirocin ointment 2 % Commonly known as: BACTROBAN       TAKE these medications    amoxicillin  500 MG capsule Commonly known as: AMOXIL  Take 1 capsule (500 mg total) by mouth 3 (three) times daily for 7 days.   carbidopa-levodopa 25-100 MG tablet Commonly known as: SINEMET IR TAKE 2 TABLETS BY MOUTH THREE TIMES DAILY   cyanocobalamin  1000 MCG tablet Commonly known as: VITAMIN B12 Take 1,000 mcg by mouth daily.   finasteride 5 MG tablet Commonly known as: PROSCAR Take 1 tablet (5 mg total) by mouth daily. Start taking on: October 14, 2024   Gemtesa  75 MG Tabs Generic drug: Vibegron  Take 1 tablet (75 mg total) by mouth daily.   metFORMIN  500 MG tablet Commonly known as: GLUCOPHAGE  Take 1  tablet (500 mg total) by mouth 2 (two) times daily.   multivitamin with minerals tablet Take 1 tablet by mouth daily.   pantoprazole 40 MG tablet Commonly known as: PROTONIX TAKE 1 TABLET BY MOUTH TWICE DAILY   rosuvastatin  40 MG tablet Commonly known as: CRESTOR  TAKE 1 TABLET BY MOUTH DAILY   Rybelsus  14 MG Tabs Generic drug: Semaglutide  Take 1 tablet (14 mg total) by mouth daily.   silodosin  8 MG Caps capsule Commonly known as: RAPAFLO  TAKE 1 CAPSULE BY MOUTH ONCE DAILY WITH BREAKFAST   venlafaxine  XR 75 MG 24 hr capsule Commonly known as:  EFFEXOR -XR TAKE 1 CAPSULE BY MOUTH ONCE DAILY   zolpidem  10 MG tablet Commonly known as: AMBIEN  Take 1 tablet (10 mg total) by mouth at bedtime.        Follow-up Information     [name], MD. Schedule an appointment as soon as possible for a visit in 3 day(s).   Specialty: Internal Medicine Why: [facility] Discharge F/UP Contact information: [address] [place] [address] [phone]                Discharge Exam: Fredricka Weights   10/10/24 1200  Weight: 95.3 kg   General: NAD, lying comfortably Appear in no distress, affect appropriate Eyes: PERRLA ENT: Oral Mucosa Clear, moist  Neck: no JVD,  Cardiovascular: S1 and S2 Present, no Murmur,  Respiratory: good respiratory effort, Bilateral Air entry equal and Decreased, no Crackles, no wheezes Abdomen: Bowel Sound present, Soft and no tenderness,  Indwelling foley Extremities: no Pedal edema, no calf tenderness Neurologic: without any new focal findings  Condition at discharge: fair  The results of significant diagnostics from this hospitalization (including imaging, microbiology, ancillary and laboratory) are listed below for reference.   Imaging Studies: US  RENAL Result Date: 10/11/2024 CLINICAL DATA:  Acute kidney injury. EXAM: RENAL / URINARY TRACT ULTRASOUND COMPLETE COMPARISON:  CT yesterday FINDINGS: Right Kidney: Renal measurements: 11.5 x 5.9 x 5 cm = volume: 175 mL. No hydronephrosis. Increased renal parenchymal echogenicity. Known right renal stone is potentially visualized in the upper pole. No focal lesion. Left Kidney: Renal measurements: 11.8 x 6.7 x 5.8 cm = volume: 242 mL. No hydronephrosis. Borderline increased parenchymal echogenicity. The known left renal stone on CT is not demonstrated by ultrasound. Bladder: Prevoid bladder volume is 377 cc. Diffuse bladder wall thickening with layering debris. Patient attempted to void could not, postvoid bladder volume of 431 cc. Other:  Enlarged prostate. IMPRESSION: 1. No obstructive uropathy. 2. Increased renal parenchymal echogenicity consistent with chronic medical renal disease. 3. Right renal stone. Known left renal stone not seen on the current exam. 4. Diffuse bladder wall thickening or debris in the urinary bladder. Bladder distension with volume of 377 cc. Patient attempted to void but could not, post attempted void bladder volume increased to 431 cc. Electronically Signed   By: Andrea Gasman M.D.   On: 10/11/2024 19:41   CT ABDOMEN PELVIS W CONTRAST Result Date: 10/10/2024 CLINICAL DATA:  Abdominal pain. EXAM: CT ABDOMEN AND PELVIS WITH CONTRAST TECHNIQUE: Multidetector CT imaging of the abdomen and pelvis was performed using the standard protocol following bolus administration of intravenous contrast. RADIATION DOSE REDUCTION: This exam was performed according to the departmental dose-optimization program which includes automated exposure control, adjustment of the mA and/or kV according to patient size and/or use of iterative reconstruction technique. CONTRAST:  100mL OMNIPAQUE IOHEXOL 300 MG/ML  SOLN COMPARISON:  CT abdomen pelvis dated 07/03/2012. FINDINGS:  Evaluation is limited due to streak artifact caused by patient's arms and overlying support wires. Lower chest: The visualized lung bases are clear. There is coronary vascular calcification. Pericardial effusion measures 9 mm in thickness anterior to the heart. No intra-abdominal free air or free fluid. Hepatobiliary: The liver is unremarkable. No biliary ductal dilatation. Multiple gallstones. No pericholecystic fluid or evidence of acute cholecystitis by CT. Pancreas: Unremarkable. No pancreatic ductal dilatation or surrounding inflammatory changes. Spleen: Normal in size without focal abnormality. Adrenals/Urinary Tract: The adrenal glands unremarkable. Nonobstructing renal calculi in the upper pole of the right kidney and inferior pole of the left kidney measure up to 2  mm. There is no hydronephrosis on either side. There is symmetric enhancement and excretion of contrast by both kidneys. The visualized ureters appear unremarkable. 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 Correlation with urinalysis recommended to evaluate for possibility of acute on chronic cystitis. Stomach/Bowel: There is mild sigmoid diverticulosis. There is no bowel obstruction or active inflammation. The appendix is normal. Vascular/Lymphatic: Mild aortoiliac atherosclerotic disease. The IVC is unremarkable. No portal venous gas. There is no adenopathy. Reproductive: Enlarged prostate gland measuring 6 cm in transverse axial diameter. Other: None Musculoskeletal: Osteopenia with degenerative changes spine. No acute osseous pathology. IMPRESSION: 1. Cholelithiasis. 2. Small nonobstructing bilateral renal calculi. No hydronephrosis. 3. 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 Correlation with urinalysis recommended to evaluate for possibility of acute on chronic cystitis. 4. Enlarged prostate gland. 5.  Aortic Atherosclerosis (ICD10-I70.0). Electronically Signed   By: Vanetta Chou M.D.   On: 10/10/2024 15:21   DG Chest Port 1 View Result Date: 10/10/2024 CLINICAL DATA:  Sepsis EXAM: PORTABLE CHEST 1 VIEW COMPARISON:  None Available. FINDINGS: Normal lung volumes. Lucency of the upper lungs, right-greater-than-left. Left basilar patchy opacity. No pleural effusion or pneumothorax. The heart size and mediastinal contours are within normal limits. No acute osseous abnormality. IMPRESSION: 1. Left basilar patchy opacity, which may represent atelectasis, aspiration, or pneumonia. 2. Lucency of the upper lungs, right-greater-than-left, which may represent emphysematous changes. Electronically Signed   By: Limin  Xu M.D.   On:  10/10/2024 13:26    Microbiology: Results for orders placed or performed during the hospital encounter of 10/10/24  Blood Culture (routine x 2)     Status: None (Preliminary result)   Collection Time: 10/10/24 12:04 PM   Specimen: BLOOD  Result Value Ref Range Status   Specimen Description BLOOD BLOOD LEFT HAND  Final   Special Requests   Final    BOTTLES DRAWN AEROBIC AND ANAEROBIC Blood Culture results may not be optimal due to an inadequate volume of blood received in culture bottles   Culture   Final    NO GROWTH 3 DAYS Performed at [facility], [address], [place], [place] [address]    Report Status PENDING  Incomplete  Blood Culture (routine x 2)     Status: None (Preliminary result)   Collection Time: 10/10/24 12:15 PM   Specimen: BLOOD  Result Value Ref Range Status   Specimen Description BLOOD RIGHT ANTECUBITAL  Final   Special Requests   Final    BOTTLES DRAWN AEROBIC AND ANAEROBIC Blood Culture results may not be optimal due to an inadequate volume of blood received in culture bottles   Culture   Final    NO GROWTH 3 DAYS Performed at [facility],  [address] [place], [place] [address]    Report Status PENDING  Incomplete  Resp panel by RT-PCR (RSV, Flu A&B, Covid) Anterior Nasal Swab     Status: None   Collection Time: 10/10/24 12:30 PM   Specimen: Anterior Nasal Swab  Result Value Ref Range Status   SARS Coronavirus 2 by RT PCR NEGATIVE NEGATIVE Final    Comment: (NOTE) SARS-CoV-2 target nucleic acids are NOT DETECTED.  The SARS-CoV-2 RNA is generally detectable in upper respiratory specimens during the acute phase of infection. The lowest concentration of SARS-CoV-2 viral copies this assay can detect is 138 copies/mL. A negative result does not preclude SARS-Cov-2 infection and should not be used as the sole basis for treatment or other patient management decisions. A negative result may occur with  improper specimen  collection/handling, submission of specimen other than nasopharyngeal swab, presence of viral mutation(s) within the areas targeted by this assay, and inadequate number of viral copies(<138 copies/mL). A negative result must be combined with clinical observations, patient history, and epidemiological information. The expected result is Negative.  Fact Sheet for Patients:  bloggercourse.com  Fact Sheet for Healthcare Providers:  seriousbroker.it  This test is no t yet approved or cleared by the [place]  FDA and  has been authorized for detection and/or diagnosis of SARS-CoV-2 by FDA under an Emergency Use Authorization (EUA). This EUA will remain  in effect (meaning this test can be used) for the duration of the COVID-19 declaration under Section 564(b)(1) of the Act, 21 U.S.C.section 360bbb-3(b)(1), unless the authorization is terminated  or revoked sooner.       Influenza A by PCR NEGATIVE NEGATIVE Final   Influenza B by PCR NEGATIVE NEGATIVE Final    Comment: (NOTE) The Xpert Xpress SARS-CoV-2/FLU/RSV plus assay is intended as an aid in the diagnosis of influenza from Nasopharyngeal swab specimens and should not be used as a sole basis for treatment. Nasal washings and aspirates are unacceptable for Xpert Xpress SARS-CoV-2/FLU/RSV testing.  Fact Sheet for Patients: bloggercourse.com  Fact Sheet for Healthcare Providers: seriousbroker.it  This test is not yet approved or cleared by the [place]  FDA and has been authorized for detection and/or diagnosis of SARS-CoV-2 by FDA under an Emergency Use Authorization (EUA). This EUA will remain in effect (meaning this test can be used) for the duration of the COVID-19 declaration under Section 564(b)(1) of the Act, 21 U.S.C. section 360bbb-3(b)(1), unless the authorization is terminated or revoked.     Resp Syncytial  Virus by PCR NEGATIVE NEGATIVE Final    Comment: (NOTE) Fact Sheet for Patients: bloggercourse.com  Fact Sheet for Healthcare Providers: seriousbroker.it  This test is not yet approved or cleared by the [place]  FDA and has been authorized for detection and/or diagnosis of SARS-CoV-2 by FDA under an Emergency Use Authorization (EUA). This EUA will remain in effect (meaning this test can be used) for the duration of the COVID-19 declaration under Section 564(b)(1) of the Act, 21 U.S.C. section 360bbb-3(b)(1), unless the authorization is terminated or revoked.  Performed at [facility], [address], [place], [place] [address]   Urine Culture     Status: Abnormal   Collection Time: 10/10/24  3:15 PM   Specimen: Urine, Clean Catch  Result Value Ref Range Status   Specimen Description   Final    URINE, CLEAN CATCH Performed at [facility], [address], [place], [place] [address]    Special Requests   Final    NONE  Performed at [facility], [address] [place], [place] [address]    Culture >=100,000 COLONIES/mL ENTEROCOCCUS FAECALIS (A)  Final   Report Status 10/12/2024 FINAL  Final   Organism ID, Bacteria ENTEROCOCCUS FAECALIS (A)  Final      Susceptibility   Enterococcus faecalis - MIC*    AMPICILLIN <=2 SENSITIVE Sensitive     NITROFURANTOIN  <=16 SENSITIVE Sensitive     VANCOMYCIN 1 SENSITIVE Sensitive     * >=100,000 COLONIES/mL ENTEROCOCCUS FAECALIS    Labs: CBC: Recent Labs  Lab 10/10/24 1211 10/11/24 0603 10/12/24 0700 10/13/24 0547  WBC 2.8* 23.4* 13.5* 9.3  NEUTROABS 2.7  --   --   --   HGB 15.0 13.6 12.5* 11.7*  HCT 44.8 40.8 36.8* 33.8*  MCV 87.8 87.9 87.6 86.2  PLT 126* 102* 73* 61*   Basic Metabolic Panel: Recent Labs  Lab 10/10/24 1211 10/11/24 0603 10/12/24 0700 10/13/24 0547  NA 144 141 139 140  K 3.7 4.1 4.3 3.8  CL 108 109 109 106  CO2 21* 21*  22 22  GLUCOSE 96 88 93 97  BUN 17 22 22 17   CREATININE 1.14 1.41* 1.03 0.90  CALCIUM  9.2 8.0* 7.8* 8.0*  MG  --  1.5* 2.2 2.0  PHOS  --  3.0 2.8 2.1*   Liver Function Tests: Recent Labs  Lab 10/10/24 1211  AST 27  ALT 10  ALKPHOS 80  BILITOT 1.2  PROT 7.5  ALBUMIN 4.2   CBG: Recent Labs  Lab 10/12/24 1135 10/12/24 1658 10/12/24 2053 10/13/24 0831 10/13/24 1151  GLUCAP 104* 110* 127* 90 104*    Discharge time spent: greater than 30 minutes.  Signed: Cresencio Fairly, MD [facility] 10/13/2024

## 2024-10-14 ENCOUNTER — Telehealth: Payer: Self-pay

## 2024-10-14 NOTE — Transitions of Care (Post Inpatient/ED Visit) (Signed)
 10/14/2024  Name: Bruce Wyatt. MRN: [id number] DOB: October 23, 1946  Today's TOC FU Call Status: Today's TOC FU Call Status:: Successful TOC FU Call Completed TOC FU Call Complete Date: 10/14/24 Patient's Name and Date of Birth confirmed.  Transition Care Management Follow-up Telephone Call Date of Discharge: 10/13/24 Discharge Facility: [facility]) Type of Discharge: Inpatient Admission Primary Inpatient Discharge Diagnosis:: Sepsis due to UTI How have you been since you were released from the hospital?: Better Any questions or concerns?: Yes Patient Questions/Concerns:: The patient has a foley catheter Patient Questions/Concerns Addressed: Other: (The patient is calling his Urologist to get his appointment moved up because he needs a voiding trial)  Items Reviewed: Did you receive and understand the discharge instructions provided?: Yes Medications obtained,verified, and reconciled?: Yes (Medications Reviewed) Any new allergies since your discharge?: No Dietary orders reviewed?: Yes Type of Diet Ordered:: Low sodium Heart Healthy Do you have support at home?: Yes People in Home [RPT]: spouse Name of Support/Comfort Primary Source: [place]  Medications Reviewed Today: Medications Reviewed Today     Reviewed by Moises Reusing, RN (Case Manager) on 10/14/24 at 1042  Med List Status: <None>   Medication Order Taking? Sig Documenting Provider Last Dose Status Informant  amoxicillin  (AMOXIL ) 500 MG capsule [id number]  Take 1 capsule (500 mg total) by mouth 3 (three) times daily for 7 days. Maree Hue, MD  Active   carbidopa-levodopa (SINEMET IR) 25-100 MG tablet [id number] No TAKE 2 TABLETS BY MOUTH THREE TIMES DAILY [name], MD 10/10/2024 Morning Active Spouse/Significant Other, Pharmacy Records  cyanocobalamin  (VITAMIN B12) 1000 MCG tablet [id number] No Take 1,000 mcg by mouth daily. [provider] 10/10/2024 Active  Spouse/Significant Other, Pharmacy Records  finasteride (PROSCAR) 5 MG tablet [id number]  Take 1 tablet (5 mg total) by mouth daily. Maree Hue, MD  Active   metFORMIN  (GLUCOPHAGE ) 500 MG tablet [id number] No Take 1 tablet (500 mg total) by mouth 2 (two) times daily. [name], MD 10/10/2024 Morning Active Spouse/Significant Other, Pharmacy Records  Multiple Vitamins-Minerals (MULTIVITAMIN WITH MINERALS) tablet [id number] No Take 1 tablet by mouth daily. [provider] 10/10/2024 Active Spouse/Significant Other, Pharmacy Records  pantoprazole (PROTONIX) 40 MG tablet [id number] No TAKE 1 TABLET BY MOUTH TWICE DAILY Tejan-Sie, S Ahmed, MD 10/10/2024 Morning Active Spouse/Significant Other, Pharmacy Records  rosuvastatin  (CRESTOR ) 40 MG tablet [id number] No TAKE 1 TABLET BY MOUTH DAILY [name], MD 10/09/2024 Bedtime Active Spouse/Significant Other, Pharmacy Records  Semaglutide  (RYBELSUS ) 14 MG TABS [id number] No Take 1 tablet (14 mg total) by mouth daily. Tejan-Sie, S Ahmed, MD 10/10/2024 Morning Active Spouse/Significant Other, Pharmacy Records  silodosin  (RAPAFLO ) 8 MG CAPS capsule [id number] No TAKE 1 CAPSULE BY MOUTH ONCE DAILY WITH BREAKFAST Fernand Fredy GORMAN, MD 10/10/2024 Morning Active Spouse/Significant Other, Pharmacy Records  venlafaxine  XR (EFFEXOR -XR) 75 MG 24 hr capsule [id number] No TAKE 1 CAPSULE BY MOUTH ONCE DAILY [name], MD 10/10/2024 Morning Active Spouse/Significant Other, Pharmacy Records  Vibegron  (GEMTESA ) 75 MG TABS [id number] No Take 1 tablet (75 mg total) by mouth daily. Twylla Glendia BROCKS, MD 10/10/2024 Morning Active Spouse/Significant Other, Pharmacy Records  zolpidem  (AMBIEN ) 10 MG tablet [id number] No Take 1 tablet (10 mg total) by mouth at bedtime. [name], MD 10/09/2024 Expired 10/10/24 2359 Spouse/Significant Other, Pharmacy Records            Home Care and Equipment/Supplies: Were Home Health Services Ordered?: Yes Name of  Home Health Agency:: [facility] Has  Agency set up a time to come to your home?: No EMR reviewed for Home Health Orders: Orders present/patient has not received call (refer to CM for follow-up) Any new equipment or medical supplies ordered?: No  Functional Questionnaire: Do you need assistance with bathing/showering or dressing?: No Do you need assistance with meal preparation?: No Do you need assistance with eating?: No Do you have difficulty maintaining continence: Yes (Currently has a foley. Had some urinary leakage) Do you need assistance with getting out of bed/getting out of a chair/moving?: No Do you have difficulty managing or taking your medications?: No  Follow up appointments reviewed: PCP Follow-up appointment confirmed?: Yes Date of PCP follow-up appointment?: 10/15/24 Follow-up Provider: Cinderella Rice Specialist [facility] Follow-up appointment confirmed?: Yes Date of Specialist follow-up appointment?: 11/19/24 Follow-Up Specialty Provider:: Glendia Barba Do you need transportation to your follow-up appointment?: No Do you understand care options if your condition(s) worsen?: Yes-patient verbalized understanding  SDOH Interventions Today    Flowsheet Row Most Recent Value  SDOH Interventions   Food Insecurity Interventions Intervention Not Indicated  Housing Interventions Intervention Not Indicated  Transportation Interventions Intervention Not Indicated  Utilities Interventions Intervention Not Indicated    Goals Addressed             This Visit's Progress    VBCI Transitions of Care (TOC) Care Plan       Problems:  Recent Hospitalization for treatment of Sepsis due to UTI Knowledge Deficit Related to New Foley Catheter  Goal:  Over the next 30 days, the patient will not experience hospital readmission  Interventions:   Evaluation of current treatment plan related to Overactive Bladder and Sepsis due to UTI; Urinary Retention, New Foley Catheter Discussed  plans with patient for ongoing care management follow up and provided patient with direct contact information for care management team Reviewed medications with patient and discussed antibiotics, medications for overactive bladder and medications for enlarged prostate Advised patient to discuss Urinary retention and voiding trial with provider The patient currently has a foley catheter. The patient will need instruction for how to care for his catheter through [facility] with [facility] Finish antibiotics Amoxicillin   Patient Self Care Activities:  Attend all scheduled provider appointments Call pharmacy for medication refills 3-7 days in advance of running out of medications Call provider office for new concerns or questions  Notify RN Care Manager of [facility] call rescheduling needs Participate in Transition of Care Program/Attend [facility] scheduled calls Perform all self care activities independently  Take medications as prescribed    Plan:  Telephone follow up appointment with care management team member scheduled for:  Monday November 3rd at 11:30am        Medford Balboa, BSN, RN [place]  VBCI - [facility] RN Care Manager [phone]

## 2024-10-14 NOTE — Patient Instructions (Signed)
 Visit Information  Thank you for taking time to visit with me today. Please don't hesitate to contact me if I can be of assistance to you before our next scheduled telephone appointment.  Our next appointment is by telephone on Monday November 3rd at 11:30am  Following is a copy of your care plan:   Goals Addressed             This Visit's Progress    VBCI Transitions of Care (TOC) Care Plan       Problems:  Recent Hospitalization for treatment of Sepsis due to UTI Knowledge Deficit Related to New Foley Catheter  Goal:  Over the next 30 days, the patient will not experience hospital readmission  Interventions:   Evaluation of current treatment plan related to Overactive Bladder and Sepsis due to UTI; Urinary Retention, New Foley Catheter Discussed plans with patient for ongoing care management follow up and provided patient with direct contact information for care management team Reviewed medications with patient and discussed antibiotics, medications for overactive bladder and medications for enlarged prostate Advised patient to discuss Urinary retention and voiding trial with provider The patient currently has a foley catheter. The patient will need instruction for how to care for his catheter through [facility] with [facility] Fin[facility] antibiotics Amoxicillin   Patient Self Care Activities:  Attend all scheduled provider appointments Call pharmacy for medication refills 3-7 days in advance of running out of medications Call provider office for new concerns or questions  Notify RN Care Manager of [facility] call rescheduling needs Participate in Transition of Care Program/Attend [facility] scheduled calls Perform all self care activities independently  Take medications as prescribed    Plan:  Telephone follow up appointment with care management team member scheduled for:  Monday November 3rd at 11:30am        Patient verbalizes understanding of instructions and care plan provided today and  agrees to view in MyChart. Active MyChart status and patient understanding of how to access instructions and care plan via MyChart confirmed with patient.     The patient has been provided with contact information for the care management team and has been advised to call with any health related questions or concerns.   Please call the care guide team at [phone] if you need to cancel or reschedule your appointment.   Please call the Suicide and Crisis Lifeline: 988 call the [place]  National Suicide Prevention Lifeline: (331) 202-2441 or TTY: 706 666 2518 TTY (240) 086-6879) to talk to a trained counselor if you are experiencing a Mental Health or Behavioral Health Crisis or need someone to talk to.  Medford Balboa, BSN, RN [place]  VBCI - [employer] [facility] RN Care Manager [phone]

## 2024-10-15 ENCOUNTER — Telehealth: Payer: Self-pay

## 2024-10-15 ENCOUNTER — Ambulatory Visit: Admitting: Internal Medicine

## 2024-10-15 LAB — CULTURE, BLOOD (ROUTINE X 2)
Culture: NO GROWTH
Culture: NO GROWTH

## 2024-10-15 NOTE — Telephone Encounter (Signed)
 Patient LM he just got home from the ED and he states he was sent home with a catheter and doesn't see his urologist until December please advise on what he should do about the catheter

## 2024-10-15 NOTE — Telephone Encounter (Signed)
 Pt called the triage line asking if he could come in tomorrow to remove his catheter that was placed in the ER on 10/11/2024. Per ER note and shannon, pt has to wait 7 days to remove foley.   LVM for pt to return call. Wanted to let pt know that the soonest he would be scheduled for is 10/21/2024 in [place] with shannon for a 2 part voiding trial. Would recommend AZO for any bladder pain due to cath.

## 2024-10-15 NOTE — Telephone Encounter (Signed)
 Pt returned call.  Pt was advise to take azo for cath pain or use oralgel for any urethral (tip of penis) discomfort. Pt stated it is not that painful just comfortable and wanted to see if he would come in sooner, reiterated to pt that he would need to have cath in for at least 7 days per provider. Pt voiced understanding, pt aware of appt time, date and location.

## 2024-10-16 ENCOUNTER — Ambulatory Visit: Admitting: Internal Medicine

## 2024-10-17 ENCOUNTER — Other Ambulatory Visit: Payer: Self-pay | Admitting: Internal Medicine

## 2024-10-17 DIAGNOSIS — E119 Type 2 diabetes mellitus without complications: Secondary | ICD-10-CM

## 2024-10-17 DIAGNOSIS — E782 Mixed hyperlipidemia: Secondary | ICD-10-CM

## 2024-10-17 DIAGNOSIS — F5101 Primary insomnia: Secondary | ICD-10-CM

## 2024-10-17 DIAGNOSIS — I10 Essential (primary) hypertension: Secondary | ICD-10-CM

## 2024-10-17 NOTE — Progress Notes (Signed)
 10/21/2024 8:51 PM   Bruce Wyatt. 08-Feb-1946 [id number]  Referring provider: [name], MD [address] [place],  [place] [address]  Urological history: 1.  Lower urinary tract symptoms - Multifactorial-BPH and Parkinson's. - Both obstructive and storage-related voiding symptoms   Chief Complaint  Patient presents with   catheter removal    HPI: Bruce Wyatt. is a 78 y.o. man who presents today for trial of void.   Previous records reviewed.  Patient was hospitalized for sepsis from October 23 to October 26.  Foley catheter was placed October 24 with return of 650 mL of urine.  He was on tamsulosin prior to admission and they started finasteride 5 mg daily during this admission.  Foley removed this am.  He went home and pushed fluids, but he was under the impression that he needed to hold his urine until his appointment this afternoon.  He was quite uncomfortable.  He then voided in the office and was relieved.    PVR 224 mL  PMH: Past Medical History:  Diagnosis Date   Abnormal colonoscopy    polyps noted, benign, [facility], rec 10 year follow up   Alcoholism (HCC)    11 yrs ago    BPH (benign prostatic hyperplasia)    Diabetes mellitus    Diverticulosis    Gallstones    GERD (gastroesophageal reflux disease)    H/O hematuria    Hemorrhoids    Hyperlipidemia    Hypertension    Iron deficiency    Sleep apnea    Vitamin D deficiency     Surgical History: Past Surgical History:  Procedure Laterality Date   CARDIOVASCULAR STRESS TEST  02/12/2009   Negative/AT [facility] Dr Lynwood Bare   COLONOSCOPY     CYSTOSCOPY  07/03/2012   RHINOPLASTY     soft palate reduction for apnea   Soft palate reduction for Sleep Apnea     [facility]   TONSILLECTOMY     UPPER GASTROINTESTINAL ENDOSCOPY     VASECTOMY      Home Medications:  Allergies as of 10/21/2024       Reactions   Ace Inhibitors    COUGH        Medication List         Accurate as of October 21, 2024  8:51 PM. If you have any questions, ask your nurse or doctor.          amoxicillin  500 MG capsule Commonly known as: AMOXIL  Take 1 capsule (500 mg total) by mouth 3 (three) times daily for 7 days.   carbidopa-levodopa 25-100 MG tablet Commonly known as: SINEMET IR TAKE 2 TABLETS BY MOUTH THREE TIMES DAILY   cyanocobalamin  1000 MCG tablet Commonly known as: VITAMIN B12 Take 1,000 mcg by mouth daily.   finasteride 5 MG tablet Commonly known as: PROSCAR Take 1 tablet (5 mg total) by mouth daily.   Gemtesa  75 MG Tabs Generic drug: Vibegron  Take 1 tablet (75 mg total) by mouth daily.   metFORMIN  500 MG tablet Commonly known as: GLUCOPHAGE  TAKE ONE (1) TABLET BY MOUTH TWO TIMES PER DAY   multivitamin with minerals tablet Take 1 tablet by mouth daily.   pantoprazole 40 MG tablet Commonly known as: PROTONIX TAKE 1 TABLET BY MOUTH TWICE DAILY   rosuvastatin  40 MG tablet Commonly known as: CRESTOR  TAKE 1 TABLET BY MOUTH DAILY   Rybelsus  14 MG Tabs Generic drug: Semaglutide  Take 1 tablet (14 mg total) by  mouth daily.   silodosin  8 MG Caps capsule Commonly known as: RAPAFLO  TAKE 1 CAPSULE BY MOUTH ONCE DAILY WITH BREAKFAST   venlafaxine  XR 75 MG 24 hr capsule Commonly known as: EFFEXOR -XR TAKE 1 CAPSULE BY MOUTH ONCE DAILY   zolpidem  10 MG tablet Commonly known as: AMBIEN  Take 1 tablet (10 mg total) by mouth at bedtime.        Allergies:  Allergies  Allergen Reactions   Ace Inhibitors     COUGH    Family History: Family History  Problem Relation Age of Onset   Breast cancer Mother    Heart disease Father        Heart Attack   Colon cancer Maternal Grandfather    Diabetes Paternal Grandfather    Heart disease Paternal Grandfather    Coronary artery disease Father    Nephrolithiasis Daughter     Social History:  reports that he has never smoked. He has never used smokeless tobacco. He reports that he does not drink  alcohol and does not use drugs.  ROS: Pertinent ROS in HPI  Physical Exam: BP 134/73 (BP Location: Left Arm, Patient Position: Sitting, Cuff Size: Large)   Pulse 60   Wt 205 lb (93 kg)   SpO2 97%   BMI 26.32 kg/m   Constitutional:  Well nourished. Alert and oriented, No acute distress. HEENT: [place] AT, moist mucus membranes.  Trachea midline Cardiovascular: No clubbing, cyanosis, or edema. Respiratory: Normal respiratory effort, no increased work of breathing. Neurologic: Grossly intact, no focal deficits, moving all 4 extremities. Psychiatric: Normal mood and affect.  Laboratory Data: See Epic and HPI   I have reviewed the labs.   Pertinent Imaging:  10/21/24 16:41  Scan Result    Assessment & Plan:    1. Urinary retention - ideally, we would of wanted him to void throughout the day for a better idea of how his bladder was functioning, but his bladder scan did not show an alarming amount of urine in his bladder and he was comfortable during his office visit - I advised him to discontinue the Gemtesa  as it could result in another episode of retention - Continue tamsulosin 0.4 mg daily and finasteride 5 mg daily - RTC or ED triggers dicussed  - Keep follow-up with Dr. Twylla in December   Return for Keep follow up with Dr. Twylla in December .  These notes generated with voice recognition software. I apologize for typographical errors.  CLOTILDA HELON RIGGERS  [facility] [address]  [address] [place], [place] [address] [phone]

## 2024-10-19 ENCOUNTER — Other Ambulatory Visit: Payer: Self-pay | Admitting: Internal Medicine

## 2024-10-19 DIAGNOSIS — E119 Type 2 diabetes mellitus without complications: Secondary | ICD-10-CM

## 2024-10-21 ENCOUNTER — Telehealth: Payer: Self-pay

## 2024-10-21 ENCOUNTER — Ambulatory Visit: Admitting: Urology

## 2024-10-21 ENCOUNTER — Encounter: Payer: Self-pay | Admitting: Urology

## 2024-10-21 VITALS — BP 134/73 | HR 60 | Wt 205.0 lb

## 2024-10-21 DIAGNOSIS — R338 Other retention of urine: Secondary | ICD-10-CM | POA: Diagnosis not present

## 2024-10-21 LAB — BLADDER SCAN AMB NON-IMAGING

## 2024-10-21 NOTE — Progress Notes (Signed)
 Catheter Removal  Patient is present today for a catheter removal.  10ml of water was drained from the balloon. A 16FR foley cath was removed from the bladder, no complications were noted. Patient tolerated well.  Performed by: Andrea Kirks LPN  Follow up/ Additional notes:

## 2024-10-21 NOTE — Telephone Encounter (Signed)
 Front desk has called as well, but he's got an appt on 11/4

## 2024-10-22 ENCOUNTER — Encounter: Payer: Self-pay | Admitting: Internal Medicine

## 2024-10-22 ENCOUNTER — Ambulatory Visit (INDEPENDENT_AMBULATORY_CARE_PROVIDER_SITE_OTHER): Admitting: Internal Medicine

## 2024-10-22 ENCOUNTER — Ambulatory Visit: Admitting: Internal Medicine

## 2024-10-22 VITALS — BP 119/83 | HR 54 | Temp 97.9°F | Ht 74.0 in | Wt 202.8 lb

## 2024-10-22 DIAGNOSIS — N400 Enlarged prostate without lower urinary tract symptoms: Secondary | ICD-10-CM | POA: Diagnosis not present

## 2024-10-22 DIAGNOSIS — E1165 Type 2 diabetes mellitus with hyperglycemia: Secondary | ICD-10-CM | POA: Diagnosis not present

## 2024-10-22 DIAGNOSIS — G20B1 Parkinson's disease with dyskinesia, without mention of fluctuations: Secondary | ICD-10-CM

## 2024-10-22 DIAGNOSIS — F5101 Primary insomnia: Secondary | ICD-10-CM | POA: Diagnosis not present

## 2024-10-22 DIAGNOSIS — I10 Essential (primary) hypertension: Secondary | ICD-10-CM | POA: Diagnosis not present

## 2024-10-22 DIAGNOSIS — E119 Type 2 diabetes mellitus without complications: Secondary | ICD-10-CM

## 2024-10-22 LAB — POCT CBG (FASTING - GLUCOSE)-MANUAL ENTRY: Glucose Fasting, POC: 103 mg/dL — AB (ref 70–99)

## 2024-10-22 MED ORDER — ZOLPIDEM TARTRATE 10 MG PO TABS: 10.0000 mg | ORAL_TABLET | Freq: Every day | ORAL | 2 refills | Status: AC

## 2024-10-22 MED ORDER — RYBELSUS 14 MG PO TABS: 1.0000 | ORAL_TABLET | Freq: Every day | ORAL | 0 refills | Status: AC

## 2024-10-22 NOTE — Progress Notes (Signed)
 Established Patient Office Visit  Subjective:  [name], male    DOB: 09/18/1946  Age: 78 y.o. MRN: [id number]  Chief Complaint  Patient presents with   Follow-up    Med refills     No new complaints, here for hospital f/u and medication refills. Was treated for toxic metabolic encephalopathy due to sepsis secondary to UTI. Fully recovered and only c/o generalized weakness and fatigue. Foley dc'd yesterday at urologist'Bruce Wyatt office and has voided several times since then.     No other concerns at this time.   Past Medical History:  Diagnosis Date   Abnormal colonoscopy    polyps noted, benign, [facility], rec 10 year follow up   Alcoholism (HCC)    11 yrs ago    BPH (benign prostatic hyperplasia)    Diabetes mellitus    Diverticulosis    Gallstones    GERD (gastroesophageal reflux disease)    H/O hematuria    Hemorrhoids    Hyperlipidemia    Hypertension    Iron deficiency    Sleep apnea    Vitamin D deficiency     Past Surgical History:  Procedure Laterality Date   CARDIOVASCULAR STRESS TEST  02/12/2009   Negative/AT [facility] Dr Bruce Wyatt   COLONOSCOPY     CYSTOSCOPY  07/03/2012   RHINOPLASTY     soft palate reduction for apnea   Soft palate reduction for Sleep Apnea     [facility]   TONSILLECTOMY     UPPER GASTROINTESTINAL ENDOSCOPY     VASECTOMY      Social History   Socioeconomic History   Marital status: Married    Spouse name: Not on file   Number of children: 2   Years of education: Not on file   Highest education level: Not on file  Occupational History   Occupation: VP of HR    Employer: [employer]  Tobacco Use   Smoking status: Never   Smokeless tobacco: Never  Substance and Sexual Activity   Alcohol use: No    Comment: quit 2002   Drug use: No   Sexual activity: Not Currently  Other Topics Concern   Not on file  Social History Narrative   Lives in [place], [place] with wife.    2 daughters   No pets. Hobbies:  fishing.   Textile management, [employer]   Regular Exercise -  NO   Daily Caffeine Use:  2 -4 coffee    No current alcohol history of alcoholism   No tobacco   Social Drivers of Corporate Investment Banker Strain: Not on file  Food Insecurity: No Food Insecurity (10/14/2024)   Hunger Vital Sign    Worried About Running Out of Food in the Last Year: Never true    Ran Out of Food in the Last Year: Never true  Transportation Needs: No Transportation Needs (10/14/2024)   PRAPARE - Administrator, Civil Service (Medical): No    Lack of Transportation (Non-Medical): No  Physical Activity: Not on file  Stress: Not on file  Social Connections: Socially Integrated (10/10/2024)   Social Connection and Isolation Panel    Frequency of Communication with Friends and Family: More than three times a week    Frequency of Social Gatherings with Friends and Family: More than three times a week    Attends Religious Services: 1 to 4 times per year    Active Member of [employer] or Organizations: Yes  Attends Banker Meetings: 1 to 4 times per year    Marital Status: Married  Catering Manager Violence: Not At Risk (10/14/2024)   Humiliation, Afraid, Rape, and Kick questionnaire    Fear of Current or Ex-Partner: No    Emotionally Abused: No    Physically Abused: No    Sexually Abused: No    Family History  Problem Relation Age of Onset   Breast cancer Mother    Heart disease Father        Heart Attack   Colon cancer Maternal Grandfather    Diabetes Paternal Grandfather    Heart disease Paternal Grandfather    Coronary artery disease Father    Nephrolithiasis Daughter     Allergies  Allergen Reactions   Ace Inhibitors     COUGH    Outpatient Medications Prior to Visit  Medication Sig   carbidopa-levodopa (SINEMET IR) 25-100 MG tablet TAKE 2 TABLETS BY MOUTH THREE TIMES DAILY   cyanocobalamin  (VITAMIN B12) 1000 MCG tablet Take 1,000 mcg by mouth daily.    finasteride (PROSCAR) 5 MG tablet Take 1 tablet (5 mg total) by mouth daily.   metFORMIN  (GLUCOPHAGE ) 500 MG tablet TAKE ONE (1) TABLET BY MOUTH TWO TIMES PER DAY   Multiple Vitamins-Minerals (MULTIVITAMIN WITH MINERALS) tablet Take 1 tablet by mouth daily.   pantoprazole (PROTONIX) 40 MG tablet TAKE 1 TABLET BY MOUTH TWICE DAILY   rosuvastatin  (CRESTOR ) 40 MG tablet TAKE 1 TABLET BY MOUTH DAILY   Semaglutide  (RYBELSUS ) 14 MG TABS Take 1 tablet (14 mg total) by mouth daily.   silodosin  (RAPAFLO ) 8 MG CAPS capsule TAKE 1 CAPSULE BY MOUTH ONCE DAILY WITH BREAKFAST   venlafaxine  XR (EFFEXOR -XR) 75 MG 24 hr capsule TAKE 1 CAPSULE BY MOUTH ONCE DAILY   zolpidem  (AMBIEN ) 10 MG tablet Take 1 tablet (10 mg total) by mouth at bedtime.   Vibegron  (GEMTESA ) 75 MG TABS Take 1 tablet (75 mg total) by mouth daily. (Patient not taking: Reported on 10/22/2024)   No facility-administered medications prior to visit.    Review of Systems  Constitutional:  Positive for weight loss (3 lbs).  HENT: Negative.    Eyes: Negative.   Respiratory: Negative.    Cardiovascular: Negative.   Gastrointestinal: Negative.   Genitourinary:  Positive for dysuria, frequency and urgency.       Nocturia  Musculoskeletal: Negative.   Skin: Negative.   Neurological: Negative.   Endo/Heme/Allergies: Negative.        Objective:   BP 119/83   Pulse (!) 54   Temp 97.9 F (36.6 C)   Ht 6' 2 (1.88 m)   Wt 202 lb 12.8 oz (92 kg)   SpO2 97%   BMI 26.04 kg/m   Vitals:   10/22/24 1126  BP: 119/83  Pulse: (!) 54  Temp: 97.9 F (36.6 C)  Height: 6' 2 (1.88 m)  Weight: 202 lb 12.8 oz (92 kg)  SpO2: 97%  BMI (Calculated): 26.03    Physical Exam Vitals and nursing note reviewed.  Constitutional:      Appearance: Normal appearance. He is overweight.  HENT:     Head: Normocephalic.     Left Ear: There is no impacted cerumen.     Nose: Nose normal.     Mouth/Throat:     Mouth: Mucous membranes are moist.      Pharynx: No posterior oropharyngeal erythema.  Eyes:     Extraocular Movements: Extraocular movements intact.     Pupils: Pupils  are equal, round, and reactive to light.  Cardiovascular:     Rate and Rhythm: Regular rhythm.     Chest Wall: PMI is not displaced.     Pulses: Normal pulses.     Heart sounds: Normal heart sounds. No murmur heard. Pulmonary:     Effort: Pulmonary effort is normal.     Breath sounds: Normal air entry. No rhonchi or rales.  Abdominal:     General: Abdomen is flat. Bowel sounds are normal. There is no distension.     Palpations: Abdomen is soft. There is no hepatomegaly, splenomegaly or mass.     Tenderness: There is no abdominal tenderness.  Musculoskeletal:        General: Normal range of motion.     Cervical back: Normal range of motion and neck supple.     Right lower leg: No edema.     Left lower leg: No edema.  Skin:    General: Skin is warm and dry.  Neurological:     General: No focal deficit present.     Mental Status: He is alert and oriented to person, place, and time.     Cranial Nerves: No cranial nerve deficit.     Motor: Tremor present. No weakness.  Psychiatric:        Mood and Affect: Mood normal.        Behavior: Behavior normal.      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   Total time spent: 30 minutes. This time includes review of previous notes and results and patient face to face interaction during today'Mitzi Lilja visit.    Sherrill Cinderella Perry, MD  10/22/2024   This document may have been prepared by [facility] Voice Recognition software and as such may include unintentional dictation errors.

## 2024-10-24 ENCOUNTER — Other Ambulatory Visit

## 2024-10-24 NOTE — Patient Instructions (Signed)
 Visit Information  Thank you for taking time to visit with me today. Please don't hesitate to contact me if I can be of assistance to you before our next scheduled telephone appointment.  Our next appointment is by telephone on Wednesday November 12th at 3:15pm  Following is a copy of your care plan:   Goals Addressed             This Visit's Progress    VBCI Transitions of Care (TOC) Care Plan       Problems: (reviewed 10/24/24) Recent Hospitalization for treatment of Sepsis due to UTI Knowledge Deficit Related to New Foley Catheter  Goal: (reviewed 10/24/24) Over the next 30 days, the patient will not experience hospital readmission  Interventions: (reviewed 10/24/24)  Evaluation of current treatment plan related to Overactive Bladder and Sepsis due to UTI; Urinary Retention, New Foley Catheter Discussed plans with patient for ongoing care management follow up and provided patient with direct contact information for care management team Reviewed medications with patient and discussed antibiotics, medications for overactive bladder and medications for enlarged prostate Advised patient to discuss Urinary retention and voiding trial with provider The patient currently has a foley catheter. The patient will need instruction for how to care for his catheter through [facility] with [facility] antibiotics Amoxicillin  - Completed Foley catheter removed 10/21/24 - Watch for spasms, blood in urine  Patient Self Care Activities:  Attend all scheduled provider appointments Call pharmacy for medication refills 3-7 days in advance of running out of medications Call provider office for new concerns or questions  Notify RN Care Manager of [facility] call rescheduling needs Participate in Transition of Care Program/Attend [facility] scheduled calls Perform all self care activities independently  Take medications as prescribed    Plan:  Telephone follow up appointment with care management team member  scheduled for:  Wednesday November 12th at 3:15pm        Patient verbalizes understanding of instructions and care plan provided today and agrees to view in MyChart. Active MyChart status and patient understanding of how to access instructions and care plan via MyChart confirmed with patient.     The patient has been provided with contact information for the care management team and has been advised to call with any health related questions or concerns.   Please call the care guide team at [phone] if you need to cancel or reschedule your appointment.   Please call the Suicide and Crisis Lifeline: 988 call the [place]  National Suicide Prevention Lifeline: 3860612599 or TTY: (605)340-2171 TTY 267-488-9908) to talk to a trained counselor if you are experiencing a Mental Health or Behavioral Health Crisis or need someone to talk to.  Medford Balboa, BSN, RN [place]  VBCI - [employer] [facility] RN Care Manager [phone]

## 2024-10-24 NOTE — Transitions of Care (Post Inpatient/ED Visit) (Signed)
 Transition of Care week 2  Visit Note  10/24/2024  Name: Bruce Wyatt. MRN: [id number]          DOB: Oct 15, 1946  Situation: Patient enrolled in [facility] 30-day program. Visit completed with Elsie Dozier by telephone.   Background:   Initial Transition Care Management Follow-up Telephone Call Discharge Date and Diagnosis: 10/13/24, Sepsis due to UTI   Past Medical History:  Diagnosis Date   Abnormal colonoscopy    polyps noted, benign, [facility], rec 10 year follow up   Alcoholism (HCC)    11 yrs ago    BPH (benign prostatic hyperplasia)    Diabetes mellitus    Diverticulosis    Gallstones    GERD (gastroesophageal reflux disease)    H/O hematuria    Hemorrhoids    Hyperlipidemia    Hypertension    Iron deficiency    Sleep apnea    Vitamin D deficiency     Assessment: Patient Reported Symptoms: Cognitive Cognitive Status: Alert and oriented to person, place, and time, Normal speech and language skills      Neurological Neurological Review of Symptoms: Not assessed    HEENT HEENT Symptoms Reported: Not assessed      Cardiovascular Cardiovascular Symptoms Reported: No symptoms reported    Respiratory Respiratory Symptoms Reported: No symptoms reported    Endocrine Endocrine Symptoms Reported: Not assessed    Gastrointestinal Gastrointestinal Symptoms Reported: No symptoms reported      Genitourinary Genitourinary Symptoms Reported: Blood in urine, Other Other Genitourinary Symptoms: Foley catheter removed. Some abdominal discomfort and blood in urine but he is voiding    Integumentary Integumentary Symptoms Reported: No symptoms reported    Musculoskeletal Musculoskelatal Symptoms Reviewed: No symptoms reported        Psychosocial Psychosocial Symptoms Reported: Not assessed         There were no vitals filed for this visit.  Medications Reviewed Today     Reviewed by Moises Reusing, RN (Case Manager) on 10/24/24 at 1510  Med List Status:  <None>   Medication Order Taking? Sig Documenting Provider Last Dose Status Informant  carbidopa-levodopa (SINEMET IR) 25-100 MG tablet [id number]  TAKE 2 TABLETS BY MOUTH THREE TIMES DAILY [name], MD  Active Spouse/Significant Other, Pharmacy Records  cyanocobalamin  (VITAMIN B12) 1000 MCG tablet [id number]  Take 1,000 mcg by mouth daily. [provider]  Active Spouse/Significant Other, Pharmacy Records  finasteride (PROSCAR) 5 MG tablet [id number]  Take 1 tablet (5 mg total) by mouth daily. Maree Hue, MD  Active   metFORMIN  (GLUCOPHAGE ) 500 MG tablet [id number]  TAKE ONE (1) TABLET BY MOUTH TWO TIMES PER DAY [name], MD  Active   Multiple Vitamins-Minerals (MULTIVITAMIN WITH MINERALS) tablet [id number]  Take 1 tablet by mouth daily. [provider]  Active Spouse/Significant Other, Pharmacy Records  pantoprazole (PROTONIX) 40 MG tablet [id number]  TAKE 1 TABLET BY MOUTH TWICE DAILY Tejan-Sie, S Ahmed, MD  Active Spouse/Significant Other, Pharmacy Records  rosuvastatin  (CRESTOR ) 40 MG tablet [id number]  TAKE 1 TABLET BY MOUTH DAILY Tejan-Sie, S Ahmed, MD  Active   Semaglutide  (RYBELSUS ) 14 MG TABS [id number]  Take 1 tablet (14 mg total) by mouth daily. Tejan-Sie, S Ahmed, MD  Active   silodosin  (RAPAFLO ) 8 MG CAPS capsule [id number]  TAKE 1 CAPSULE BY MOUTH ONCE DAILY WITH BREAKFAST Fernand Fredy GORMAN, MD  Active Spouse/Significant Other, Pharmacy Records  venlafaxine  XR (EFFEXOR -XR) 75 MG 24 hr capsule [id number]  TAKE 1 CAPSULE BY MOUTH ONCE DAILY  [name], MD  Active Spouse/Significant Other, Pharmacy Records  Vibegron  (GEMTESA ) 75 MG TABS [id number]  Take 1 tablet (75 mg total) by mouth daily.  Patient not taking: Reported on 10/22/2024   Twylla Glendia BROCKS, MD  Active Spouse/Significant Other, Pharmacy Records  zolpidem  (AMBIEN ) 10 MG tablet [id number]  Take 1 tablet (10 mg total) by mouth at bedtime. [name], MD  Active              Recommendation:   Continue Current Plan of Care  Follow Up Plan:   Telephone follow-up in 1 week  Medford Balboa, BSN, RN [place]  VBCI - [facility] RN Care Manager [phone]

## 2024-10-25 ENCOUNTER — Telehealth: Payer: Self-pay

## 2024-10-25 ENCOUNTER — Ambulatory Visit: Admitting: Urology

## 2024-10-25 VITALS — BP 140/75 | HR 69 | Ht 74.0 in | Wt 205.0 lb

## 2024-10-25 DIAGNOSIS — N401 Enlarged prostate with lower urinary tract symptoms: Secondary | ICD-10-CM | POA: Diagnosis not present

## 2024-10-25 DIAGNOSIS — R399 Unspecified symptoms and signs involving the genitourinary system: Secondary | ICD-10-CM

## 2024-10-25 DIAGNOSIS — R338 Other retention of urine: Secondary | ICD-10-CM | POA: Diagnosis not present

## 2024-10-25 LAB — MICROSCOPIC EXAMINATION
RBC, Urine: >30 /HPF — ABNORMAL HIGH (ref 0–2)
WBC, UA: >30 /HPF — ABNORMAL HIGH (ref 0–5)

## 2024-10-25 LAB — URINALYSIS, COMPLETE
Bilirubin, UA: NEGATIVE
Glucose, UA: NEGATIVE
Ketones, UA: NEGATIVE
Nitrite, UA: NEGATIVE
Specific Gravity, UA: 1.025 (ref 1.005–1.030)
Urobilinogen, Ur: 0.2 mg/dL (ref 0.2–1.0)
pH, UA: 6 (ref 5.0–7.5)

## 2024-10-25 LAB — BLADDER SCAN AMB NON-IMAGING: Scan Result: 1006

## 2024-10-25 MED ORDER — CEFUROXIME AXETIL 250 MG PO TABS: 250.0000 mg | ORAL_TABLET | Freq: Two times a day (BID) | ORAL | 0 refills | Status: AC

## 2024-10-25 NOTE — Progress Notes (Signed)
 10/25/2024 9:14 PM   Bruce Wyatt. [birth year] [id number]  Referring provider: [name], MD [address] [place],  [place] [address]  Urological history: 1.  Lower urinary tract symptoms - Multifactorial-BPH and Parkinson's. - Both obstructive and storage-related voiding symptoms   Chief Complaint  Patient presents with   Urinary Retention   HPI: Bruce Hammen. is a 78 y.o. man who presents today for trial of void.   Previous records reviewed.  Patient was hospitalized for sepsis from October 23 to October 26.  Foley catheter was placed October 24 with return of 650 mL of urine.  He was on tamsulosin prior to admission and they started finasteride 5 mg daily during this admission.  Foley removed November 3rd. He went home and pushed fluids, but he was under the impression that he needed to hold his urine until his appointment this afternoon.  He was quite uncomfortable.  He then voided in the office and was relieved.  PVR 224 mL  He states over the past few days he has found it more more difficult to empty his bladder.  He states the urine is just trickling out now.  He is very uncomfortable.  Patient denies any modifying or aggravating factors.  Patient denies any recent UTI's, gross hematuria, dysuria or suprapubic/flank pain.  Patient denies any fevers, chills, nausea or vomiting.    He states he has discontinued his Gemtesa .   UA brown cloudy, specific gravity 1.025, pH 6.0, 2+ protein, 3+ heme, 1+ leukocyte, greater than 30 WBCs, greater than 30 RBCs, 0-10 epithelial cells and a few bacteria.  PVR 1006 mL   PMH: Past Medical History:  Diagnosis Date   Abnormal colonoscopy    polyps noted, benign, [facility], rec 10 year follow up   Alcoholism (HCC)    11 yrs ago    BPH (benign prostatic hyperplasia)    Diabetes mellitus    Diverticulosis    Gallstones    GERD (gastroesophageal reflux disease)    H/O hematuria    Hemorrhoids    Hyperlipidemia     Hypertension    Iron deficiency    Sleep apnea    Vitamin D deficiency     Surgical History: Past Surgical History:  Procedure Laterality Date   CARDIOVASCULAR STRESS TEST  02/12/2009   Negative/AT [facility] Dr [facility]   COLONOSCOPY     CYSTOSCOPY  07/03/2012   RHINOPLASTY     soft palate reduction for apnea   Soft palate reduction for Sleep Apnea     [facility]   TONSILLECTOMY     UPPER GASTROINTESTINAL ENDOSCOPY     VASECTOMY      Home Medications:  Allergies as of 10/25/2024       Reactions   Ace Inhibitors    COUGH        Medication List        Accurate as of October 25, 2024 11:59 PM. If you have any questions, ask your nurse or doctor.          carbidopa-levodopa 25-100 MG tablet Commonly known as: SINEMET IR TAKE 2 TABLETS BY MOUTH THREE TIMES DAILY   cefUROXime 250 MG tablet Commonly known as: CEFTIN Take 1 tablet (250 mg total) by mouth 2 (two) times daily with a meal for 7 days. Started by: Tremont Gavitt   cyanocobalamin  1000 MCG tablet Commonly known as: VITAMIN B12 Take 1,000 mcg by mouth daily.   finasteride 5 MG tablet Commonly  known as: PROSCAR Take 1 tablet (5 mg total) by mouth daily.   Gemtesa  75 MG Tabs Generic drug: Vibegron  Take 1 tablet (75 mg total) by mouth daily.   metFORMIN  500 MG tablet Commonly known as: GLUCOPHAGE  TAKE ONE (1) TABLET BY MOUTH TWO TIMES PER DAY   multivitamin with minerals tablet Take 1 tablet by mouth daily.   pantoprazole 40 MG tablet Commonly known as: PROTONIX TAKE 1 TABLET BY MOUTH TWICE DAILY   rosuvastatin  40 MG tablet Commonly known as: CRESTOR  TAKE 1 TABLET BY MOUTH DAILY   Rybelsus  14 MG Tabs Generic drug: Semaglutide  Take 1 tablet (14 mg total) by mouth daily.   silodosin  8 MG Caps capsule Commonly known as: RAPAFLO  TAKE 1 CAPSULE BY MOUTH ONCE DAILY WITH BREAKFAST   venlafaxine  XR 75 MG 24 hr capsule Commonly known as: EFFEXOR -XR TAKE 1 CAPSULE BY MOUTH ONCE DAILY    zolpidem  10 MG tablet Commonly known as: AMBIEN  Take 1 tablet (10 mg total) by mouth at bedtime.        Allergies:  Allergies  Allergen Reactions   Ace Inhibitors     COUGH    Family History: Family History  Problem Relation Age of Onset   Breast cancer Mother    Heart disease Father        Heart Attack   Colon cancer Maternal Grandfather    Diabetes Paternal Grandfather    Heart disease Paternal Grandfather    Coronary artery disease Father    Nephrolithiasis Daughter     Social History:  reports that he has never smoked. He has never used smokeless tobacco. He reports that he does not drink alcohol and does not use drugs.  ROS: Pertinent ROS in HPI  Physical Exam: BP (!) 140/75   Pulse 69   Ht 6' 2 (1.88 m)   Wt 205 lb (93 kg)   BMI 26.32 kg/m   Constitutional:  Well nourished. Alert and oriented, No acute distress. HEENT: [place] AT, moist mucus membranes.  Trachea midline Cardiovascular: No clubbing, cyanosis, or edema. Respiratory: Normal respiratory effort, no increased work of breathing. Neurologic: Grossly intact, no focal deficits, moving all 4 extremities. Psychiatric: Normal mood and affect.  Laboratory Data: See Epic and HPI   I have reviewed the labs.   Pertinent Imaging:  10/25/24 11:42  Scan Result 1,006    Simple Catheter Placement Due to urinary retention patient is present today for a foley cath placement.  Patient was cleaned and prepped in a sterile fashion with betadine and 2% lidocaine  jelly was instilled into the urethra. A 16 FR foley catheter was inserted, urine return was noted  1000 ml, urine was amber, clear in color.  The balloon was filled with 10cc of sterile water.  A leg bag was attached for drainage. Patient was also given a night bag to take home and was given instruction on how to change from one bag to another.  Patient was given instruction on proper catheter care.  Patient tolerated well, no complications were noted    Performed by: Trish LITTIE Pinal, RN    Assessment & Plan:    1. Urinary retention - Foley placed for 1000 mL out  - UA w/ pyuria and heme - urine culture pending - Given his recent history of sepsis, I have started on Ceftin 250 mg daily, will adjust if necessary once culture results are returned - He would like to have another trial of void before pursuing further intervention  Return  in about 1 week (around 11/01/2024) for TOV/PVR.  These notes generated with voice recognition software. I apologize for typographical errors.  CLOTILDA HELON RIGGERS  [facility] [address]  [address] [place], [place] [address] [phone]

## 2024-10-25 NOTE — Telephone Encounter (Signed)
 PT Lm on triage line   Saw SM for VT on 11/3. Since Wed he is having low abd pain. Dysuria. Urgency.  Small amt of blood on his pad x 1 on Wed. No fevers. Trying to push fluids.    Minimal amts of urine when he voids. Ok per SM to be seen today. Appt made pt aware to come to clinic ASAP.   Pt voices understanding.

## 2024-10-27 ENCOUNTER — Encounter: Payer: Self-pay | Admitting: Urology

## 2024-10-29 ENCOUNTER — Ambulatory Visit: Admitting: Physician Assistant

## 2024-10-29 NOTE — Progress Notes (Deleted)
 10/31/2024 9:30 PM   Bruce Wyatt. [birth year] [id number]  Referring provider: [name], MD [address] [place],  [place] [address]  Urological history: 1.  Lower urinary tract symptoms - Multifactorial-BPH and Parkinson's. - Both obstructive and storage-related voiding symptoms   No chief complaint on file.  HPI: Bruce Bebo. is a 78 y.o. man who presents today for trial of void.   Previous records reviewed.  At previous visits,patient was hospitalized for sepsis from October 23 to October 26.  Foley catheter was placed October 24 with return of 650 mL of urine.  He was on tamsulosin prior to admission and they started finasteride 5 mg daily during this admission.  Foley removed November 3rd. He went home and pushed fluids, but he was under the impression that he needed to hold his urine until his appointment this afternoon.  He was quite uncomfortable.  He then voided in the office and was relieved.  PVR 224 mL  He states over the past few days he has found it more more difficult to empty his bladder.  He states the urine is just trickling out now.  He is very uncomfortable.  Patient denies any modifying or aggravating factors.  Patient denies any recent UTI's, gross hematuria, dysuria or suprapubic/flank pain.  Patient denies any fevers, chills, nausea or vomiting.  He states he has discontinued his Gemtesa . UA brown cloudy, specific gravity 1.025, pH 6.0, 2+ protein, 3+ heme, 1+ leukocyte, greater than 30 WBCs, greater than 30 RBCs, 0-10 epithelial cells and a few bacteria.  PVR 1006 mL.  Foley catheter placed.    He had his Foley removed this am.    PMH: Past Medical History:  Diagnosis Date   Abnormal colonoscopy    polyps noted, benign, [facility], rec 10 year follow up   Alcoholism (HCC)    11 yrs ago    BPH (benign prostatic hyperplasia)    Diabetes mellitus    Diverticulosis    Gallstones    GERD (gastroesophageal reflux disease)    H/O  hematuria    Hemorrhoids    Hyperlipidemia    Hypertension    Iron deficiency    Sleep apnea    Vitamin D deficiency     Surgical History: Past Surgical History:  Procedure Laterality Date   CARDIOVASCULAR STRESS TEST  02/12/2009   Negative/AT [facility] Dr [facility]   COLONOSCOPY     CYSTOSCOPY  07/03/2012   RHINOPLASTY     soft palate reduction for apnea   Soft palate reduction for Sleep Apnea     [facility]   TONSILLECTOMY     UPPER GASTROINTESTINAL ENDOSCOPY     VASECTOMY      Home Medications:  Allergies as of 10/31/2024       Reactions   Ace Inhibitors    COUGH        Medication List        Accurate as of October 29, 2024  9:30 PM. If you have any questions, ask your nurse or doctor.          carbidopa-levodopa 25-100 MG tablet Commonly known as: SINEMET IR TAKE 2 TABLETS BY MOUTH THREE TIMES DAILY   cefUROXime 250 MG tablet Commonly known as: CEFTIN Take 1 tablet (250 mg total) by mouth 2 (two) times daily with a meal for 7 days.   cyanocobalamin  1000 MCG tablet Commonly known as: VITAMIN B12 Take 1,000 mcg by mouth daily.   finasteride  5 MG tablet Commonly known as: PROSCAR Take 1 tablet (5 mg total) by mouth daily.   Gemtesa  75 MG Tabs Generic drug: Vibegron  Take 1 tablet (75 mg total) by mouth daily.   metFORMIN  500 MG tablet Commonly known as: GLUCOPHAGE  TAKE ONE (1) TABLET BY MOUTH TWO TIMES PER DAY   multivitamin with minerals tablet Take 1 tablet by mouth daily.   pantoprazole 40 MG tablet Commonly known as: PROTONIX TAKE 1 TABLET BY MOUTH TWICE DAILY   rosuvastatin  40 MG tablet Commonly known as: CRESTOR  TAKE 1 TABLET BY MOUTH DAILY   Rybelsus  14 MG Tabs Generic drug: Semaglutide  Take 1 tablet (14 mg total) by mouth daily.   silodosin  8 MG Caps capsule Commonly known as: RAPAFLO  TAKE 1 CAPSULE BY MOUTH ONCE DAILY WITH BREAKFAST   venlafaxine  XR 75 MG 24 hr capsule Commonly known as: EFFEXOR -XR TAKE 1 CAPSULE BY  MOUTH ONCE DAILY   zolpidem  10 MG tablet Commonly known as: AMBIEN  Take 1 tablet (10 mg total) by mouth at bedtime.        Allergies:  Allergies  Allergen Reactions   Ace Inhibitors     COUGH    Family History: Family History  Problem Relation Age of Onset   Breast cancer Mother    Heart disease Father        Heart Attack   Colon cancer Maternal Grandfather    Diabetes Paternal Grandfather    Heart disease Paternal Grandfather    Coronary artery disease Father    Nephrolithiasis Daughter     Social History:  reports that he has never smoked. He has never used smokeless tobacco. He reports that he does not drink alcohol and does not use drugs.  ROS: Pertinent ROS in HPI  Physical Exam: There were no vitals taken for this visit.  Constitutional:  Well nourished. Alert and oriented, No acute distress. HEENT: [place] AT, moist mucus membranes.  Trachea midline, no masses. Cardiovascular: No clubbing, cyanosis, or edema. Respiratory: Normal respiratory effort, no increased work of breathing. GI: Abdomen is soft, non tender, non distended, no abdominal masses. Liver and spleen not palpable.  No hernias appreciated.  Stool sample for occult testing is not indicated.   GU: No CVA tenderness.  No bladder fullness or masses.  Patient with circumcised/uncircumcised phallus. ***Foreskin easily retracted***  Urethral meatus is patent.  No penile discharge. No penile lesions or rashes. Scrotum without lesions, cysts, rashes and/or edema.  Testicles are located scrotally bilaterally. No masses are appreciated in the testicles. Left and right epididymis are normal. Rectal: Patient with  normal sphincter tone. Anus and perineum without scarring or rashes. No rectal masses are appreciated. Prostate is approximately *** grams, *** nodules are appreciated. Seminal vesicles are normal. Skin: No rashes, bruises or suspicious lesions. Lymph: No cervical or inguinal adenopathy. Neurologic: Grossly  intact, no focal deficits, moving all 4 extremities. Psychiatric: Normal mood and affect.   Laboratory Data: See Epic and HPI   I have reviewed the labs.   Pertinent Imaging: ***   Assessment & Plan:    1. Urinary retention - ***  No follow-ups on file.  These notes generated with voice recognition software. I apologize for typographical errors.  Bruce Wyatt  [facility] [address]  [address] [place], [place] [address] [phone]

## 2024-10-30 ENCOUNTER — Other Ambulatory Visit: Payer: Self-pay

## 2024-10-30 NOTE — Transitions of Care (Post Inpatient/ED Visit) (Signed)
 Transition of Care                                                                                  Visit Note                          Week 3 10/30/2024  Name: Sahir Tolson. MRN: [id number]          DOB: 05-18-1946  Situation: Patient enrolled in [facility] 30-day program. Visit completed with Elsie Dozier by telephone.   Background:   Initial Transition Care Management Follow-up Telephone Call Discharge Date and Diagnosis: 10/13/24, Sepsis due to UTI   Past Medical History:  Diagnosis Date   Abnormal colonoscopy    polyps noted, benign, [facility], rec 10 year follow up   Alcoholism (HCC)    11 yrs ago    BPH (benign prostatic hyperplasia)    Diabetes mellitus    Diverticulosis    Gallstones    GERD (gastroesophageal reflux disease)    H/O hematuria    Hemorrhoids    Hyperlipidemia    Hypertension    Iron deficiency    Sleep apnea    Vitamin D deficiency     Assessment: Patient Reported Symptoms: Cognitive Cognitive Status: Alert and oriented to person, place, and time, Normal speech and language skills      Neurological Neurological Review of Symptoms: No symptoms reported    HEENT HEENT Symptoms Reported: No symptoms reported      Cardiovascular Cardiovascular Symptoms Reported: No symptoms reported    Respiratory Respiratory Symptoms Reported: No symptoms reported    Endocrine Endocrine Symptoms Reported: No symptoms reported Is patient diabetic?: Yes Is patient checking blood sugars at home?: No    Gastrointestinal Gastrointestinal Symptoms Reported: No symptoms reported      Genitourinary Genitourinary Symptoms Reported: Other Other Genitourinary Symptoms: Foley catheter reinserted due to abdominal pain, urgency, blood in urine Genitourinary Management Strategies: Catheter, indwelling Indwelling Catheter Inserted: 10/25/24  Integumentary Integumentary Symptoms Reported: No symptoms reported    Musculoskeletal Musculoskelatal Symptoms Reviewed: No  symptoms reported        Psychosocial Psychosocial Symptoms Reported: No symptoms reported         There were no vitals filed for this visit. Pain Score: 0-No pain  Medications Reviewed Today     Reviewed by Moises Reusing, RN (Case Manager) on 10/30/24 at 1520  Med List Status: <None>   Medication Order Taking? Sig Documenting Provider Last Dose Status Informant  carbidopa-levodopa (SINEMET IR) 25-100 MG tablet [id number]  TAKE 2 TABLETS BY MOUTH THREE TIMES DAILY [name], MD  Active Spouse/Significant Other, Pharmacy Records  cefUROXime (CEFTIN) 250 MG tablet [id number]  Take 1 tablet (250 mg total) by mouth 2 (two) times daily with a meal for 7 days. Helon Kirsch A, PA-C  Active   cyanocobalamin  (VITAMIN B12) 1000 MCG tablet [id number]  Take 1,000 mcg by mouth daily. [provider]  Active Spouse/Significant Other, Pharmacy Records  finasteride (PROSCAR) 5 MG tablet [id number]  Take 1 tablet (5 mg total) by mouth daily. Maree Hue, MD  Active   metFORMIN  (GLUCOPHAGE ) 500 MG tablet [id number]  TAKE ONE (1) TABLET BY MOUTH TWO TIMES PER DAY [name], MD  Active   Multiple Vitamins-Minerals (MULTIVITAMIN WITH MINERALS) tablet [id number]  Take 1 tablet by mouth daily. [provider]  Active Spouse/Significant Other, Pharmacy Records  pantoprazole (PROTONIX) 40 MG tablet [id number]  TAKE 1 TABLET BY MOUTH TWICE DAILY Tejan-Sie, S Ahmed, MD  Active Spouse/Significant Other, Pharmacy Records  rosuvastatin  (CRESTOR ) 40 MG tablet [id number]  TAKE 1 TABLET BY MOUTH DAILY Tejan-Sie, S Ahmed, MD  Active   Semaglutide  (RYBELSUS ) 14 MG TABS [id number]  Take 1 tablet (14 mg total) by mouth daily. Tejan-Sie, S Ahmed, MD  Active   silodosin  (RAPAFLO ) 8 MG CAPS capsule [id number]  TAKE 1 CAPSULE BY MOUTH ONCE DAILY WITH BREAKFAST Fernand Fredy GORMAN, MD  Active Spouse/Significant Other, Pharmacy Records  venlafaxine  XR (EFFEXOR -XR) 75 MG 24 hr capsule [id number]  TAKE 1  CAPSULE BY MOUTH ONCE DAILY [name], MD  Active Spouse/Significant Other, Pharmacy Records  Vibegron  (GEMTESA ) 75 MG TABS [id number]  Take 1 tablet (75 mg total) by mouth daily.  Patient not taking: Reported on 10/30/2024   Twylla Glendia BROCKS, MD  Active Spouse/Significant Other, Pharmacy Records  zolpidem  (AMBIEN ) 10 MG tablet [id number]  Take 1 tablet (10 mg total) by mouth at bedtime. [name], MD  Active             Recommendation:   Continue Current Plan of Care  Follow Up Plan:   Telephone follow-up in 1 week  Medford Balboa, BSN, RN [place]  VBCI - [facility] RN Care Manager [phone]

## 2024-10-30 NOTE — Patient Instructions (Signed)
 Visit Information  Thank you for taking time to visit with me today. Please don't hesitate to contact me if I can be of assistance to you before our next scheduled telephone appointment.  Our next appointment is by telephone on Tuesday November 18th at 2:15pm  Following is a copy of your care plan:   Goals Addressed             This Visit's Progress    VBCI Transitions of Care (TOC) Care Plan       Problems: (reviewed 10/30/24) Recent Hospitalization for treatment of Sepsis due to UTI Knowledge Deficit Related to New Foley Catheter  Goal: (reviewed 10/30/24) Over the next 30 days, the patient will not experience hospital readmission  Interventions: (reviewed 10/30/24)  Evaluation of current treatment plan related to Overactive Bladder and Sepsis due to UTI; Urinary Retention, New Foley Catheter Discussed plans with patient for ongoing care management follow up and provided patient with direct contact information for care management team Reviewed medications with patient and discussed antibiotics, medications for overactive bladder and medications for enlarged prostate Advised patient to discuss Urinary retention and voiding trial with provider The patient currently has a foley catheter. The patient will need instruction for how to care for his catheter through [facility] with [facility] antibiotics Amoxicillin  - Completed Foley catheter removed 10/21/24 - Watch for spasms, blood in urine 11/12 - Foley catheter reinserted due to abdominal pain, dysuria, urgency. Trail void set for 11/13 and 11/14 Ceftin 250mg  twice a day restarted  Patient Self Care Activities:  Attend all scheduled provider appointments Call pharmacy for medication refills 3-7 days in advance of running out of medications Call provider office for new concerns or questions  Notify RN Care Manager of [facility] call rescheduling needs Participate in Transition of Care Program/Attend [facility] scheduled calls Perform all self  care activities independently  Take medications as prescribed    Plan:  Telephone follow up appointment with care management team member scheduled for:  Tuesday November 18th at 2:15pm        Patient verbalizes understanding of instructions and care plan provided today and agrees to view in MyChart. Active MyChart status and patient understanding of how to access instructions and care plan via MyChart confirmed with patient.     The patient has been provided with contact information for the care management team and has been advised to call with any health related questions or concerns.   Please call the care guide team at [phone] if you need to cancel or reschedule your appointment.   Please call the Suicide and Crisis Lifeline: 988 call the [place]  National Suicide Prevention Lifeline: (747) 648-3435 or TTY: 602-478-3129 TTY (941) 277-9916) to talk to a trained counselor if you are experiencing a Mental Health or Behavioral Health Crisis or need someone to talk to.  Medford Balboa, BSN, RN [place]  VBCI - [employer] [facility] RN Care Manager [phone]

## 2024-10-31 ENCOUNTER — Ambulatory Visit (INDEPENDENT_AMBULATORY_CARE_PROVIDER_SITE_OTHER): Admitting: Urology

## 2024-10-31 ENCOUNTER — Ambulatory Visit: Admitting: Urology

## 2024-10-31 DIAGNOSIS — N401 Enlarged prostate with lower urinary tract symptoms: Secondary | ICD-10-CM

## 2024-10-31 DIAGNOSIS — R338 Other retention of urine: Secondary | ICD-10-CM

## 2024-10-31 LAB — BLADDER SCAN AMB NON-IMAGING

## 2024-10-31 NOTE — Progress Notes (Unsigned)
 10/31/2024 9:30 PM    Bruce Wyatt. [birth year] [id number]   Referring provider: [name], MD [address] [place],  [place] [address]   Urological history: 1.  Lower urinary tract symptoms - Multifactorial-BPH and Parkinson's. - Both obstructive and storage-related voiding symptoms    No chief complaint on file.   HPI: Bruce Casto. is a 78 y.o. man who presents today for trial of void.    Previous records reviewed.   At previous visits,patient was hospitalized for sepsis from October 23 to October 26.  Foley catheter was placed October 24 with return of 650 mL of urine.  He was on tamsulosin prior to admission and they started finasteride 5 mg daily during this admission.  Foley removed November 3rd. He went home and pushed fluids, but he was under the impression that he needed to hold his urine until his appointment this afternoon.  He was quite uncomfortable.  He then voided in the office and was relieved.  PVR 224 mL  He states over the past few days he has found it more more difficult to empty his bladder.  He states the urine is just trickling out now.  He is very uncomfortable.  Patient denies any modifying or aggravating factors.  Patient denies any recent UTI's, gross hematuria, dysuria or suprapubic/flank pain.  Patient denies any fevers, chills, nausea or vomiting.  He states he has discontinued his Gemtesa . UA brown cloudy, specific gravity 1.025, pH 6.0, 2+ protein, 3+ heme, 1+ leukocyte, greater than 30 WBCs, greater than 30 RBCs, 0-10 epithelial cells and a few bacteria.  PVR 1006 mL.  Foley catheter placed.     He had his Foley removed this am.     PMH:     Past Medical History:  Diagnosis Date   Abnormal colonoscopy      polyps noted, benign, [facility], rec 10 year follow up   Alcoholism (HCC)      11 yrs ago    BPH (benign prostatic hyperplasia)     Diabetes mellitus     Diverticulosis     Gallstones     GERD (gastroesophageal reflux  disease)     H/O hematuria     Hemorrhoids     Hyperlipidemia     Hypertension     Iron deficiency     Sleep apnea     Vitamin D deficiency            Surgical History:      Past Surgical History:  Procedure Laterality Date   CARDIOVASCULAR STRESS TEST   02/12/2009    Negative/AT [facility] Dr [facility]   COLONOSCOPY       CYSTOSCOPY   07/03/2012   RHINOPLASTY        soft palate reduction for apnea   Soft palate reduction for Sleep Apnea        [facility]   TONSILLECTOMY       UPPER GASTROINTESTINAL ENDOSCOPY       VASECTOMY              Home Medications:  Allergies as of 10/31/2024         Reactions    Ace Inhibitors      COUGH            Medication List           Accurate as of October 29, 2024  9:30 PM. If you have any questions, ask your  nurse or doctor.              carbidopa-levodopa 25-100 MG tablet Commonly known as: SINEMET IR TAKE 2 TABLETS BY MOUTH THREE TIMES DAILY    cefUROXime 250 MG tablet Commonly known as: CEFTIN Take 1 tablet (250 mg total) by mouth 2 (two) times daily with a meal for 7 days.    cyanocobalamin  1000 MCG tablet Commonly known as: VITAMIN B12 Take 1,000 mcg by mouth daily.    finasteride 5 MG tablet Commonly known as: PROSCAR Take 1 tablet (5 mg total) by mouth daily.    Gemtesa  75 MG Tabs Generic drug: Vibegron  Take 1 tablet (75 mg total) by mouth daily.    metFORMIN  500 MG tablet Commonly known as: GLUCOPHAGE  TAKE ONE (1) TABLET BY MOUTH TWO TIMES PER DAY    multivitamin with minerals tablet Take 1 tablet by mouth daily.    pantoprazole 40 MG tablet Commonly known as: PROTONIX TAKE 1 TABLET BY MOUTH TWICE DAILY    rosuvastatin  40 MG tablet Commonly known as: CRESTOR  TAKE 1 TABLET BY MOUTH DAILY    Rybelsus  14 MG Tabs Generic drug: Semaglutide  Take 1 tablet (14 mg total) by mouth daily.    silodosin  8 MG Caps capsule Commonly known as: RAPAFLO  TAKE 1 CAPSULE BY MOUTH ONCE DAILY WITH  BREAKFAST    venlafaxine  XR 75 MG 24 hr capsule Commonly known as: EFFEXOR -XR TAKE 1 CAPSULE BY MOUTH ONCE DAILY    zolpidem  10 MG tablet Commonly known as: AMBIEN  Take 1 tablet (10 mg total) by mouth at bedtime.             Allergies:  Allergies       Allergies  Allergen Reactions   Ace Inhibitors        COUGH        Family History:      Family History  Problem Relation Age of Onset   Breast cancer Mother     Heart disease Father          Heart Attack   Colon cancer Maternal Grandfather     Diabetes Paternal Grandfather     Heart disease Paternal Grandfather     Coronary artery disease Father     Nephrolithiasis Daughter            Social History:  reports that he has never smoked. He has never used smokeless tobacco. He reports that he does not drink alcohol and does not use drugs.   ROS: Pertinent ROS in HPI   Physical Exam: There were no vitals taken for this visit.  Constitutional:  Well nourished. Alert and oriented, No acute distress. HEENT: [place] AT, moist mucus membranes.  Trachea midline, no masses. Cardiovascular: No clubbing, cyanosis, or edema. Respiratory: Normal respiratory effort, no increased work of breathing. GI: Abdomen is soft, non tender, non distended, no abdominal masses. Liver and spleen not palpable.  No hernias appreciated.  Stool sample for occult testing is not indicated.   GU: No CVA tenderness.  No bladder fullness or masses.  Patient with circumcised/uncircumcised phallus. ***Foreskin easily retracted***  Urethral meatus is patent.  No penile discharge. No penile lesions or rashes. Scrotum without lesions, cysts, rashes and/or edema.  Testicles are located scrotally bilaterally. No masses are appreciated in the testicles. Left and right epididymis are normal. Rectal: Patient with  normal sphincter tone. Anus and perineum without scarring or rashes. No rectal masses are appreciated. Prostate is approximately *** grams, *** nodules are  appreciated. Seminal vesicles are normal. Skin: No rashes, bruises or suspicious lesions. Lymph: No cervical or inguinal adenopathy. Neurologic: Grossly intact, no focal deficits, moving all 4 extremities. Psychiatric: Normal mood and affect.    Laboratory Data: See Epic and HPI   I have reviewed the labs.     Pertinent Imaging: ***     Assessment & Plan:     1. Urinary retention - ***   No follow-ups on file.   These notes generated with voice recognition software. I apologize for typographical errors.   Bruce Wyatt   [facility] [address]  [address] [place], [place] [address] [phone]

## 2024-10-31 NOTE — Progress Notes (Signed)
 Catheter Removal  Patient is present today for a catheter removal.  10ml of water was drained from the balloon. A 16FR foley cath was removed from the bladder, no complications were noted. Patient tolerated well.  Performed by: Mathew Pinal, RN  Follow up/ Additional notes: Follow up this afternoon for voiding trial.

## 2024-11-01 ENCOUNTER — Encounter: Payer: Self-pay | Admitting: Urology

## 2024-11-01 ENCOUNTER — Ambulatory Visit (INDEPENDENT_AMBULATORY_CARE_PROVIDER_SITE_OTHER): Admitting: Urology

## 2024-11-01 VITALS — BP 110/63 | HR 70 | Wt 202.0 lb

## 2024-11-01 DIAGNOSIS — R339 Retention of urine, unspecified: Secondary | ICD-10-CM

## 2024-11-01 DIAGNOSIS — N401 Enlarged prostate with lower urinary tract symptoms: Secondary | ICD-10-CM

## 2024-11-01 DIAGNOSIS — N390 Urinary tract infection, site not specified: Secondary | ICD-10-CM

## 2024-11-01 LAB — CULTURE, URINE COMPREHENSIVE

## 2024-11-01 LAB — BLADDER SCAN AMB NON-IMAGING

## 2024-11-01 MED ORDER — SULFAMETHOXAZOLE-TRIMETHOPRIM 800-160 MG PO TABS: 1.0000 | ORAL_TABLET | Freq: Two times a day (BID) | ORAL | 0 refills | Status: DC

## 2024-11-01 NOTE — Progress Notes (Signed)
 11/01/2024 9:08 PM   Bruce Wyatt. [birth year] [id number]  Referring provider: [name], MD [address] [place],  [place] [address]  Urological history: 1.  BPH with retention - Multifactorial-BPH and Parkinson's. - Both obstructive and storage-related voiding symptoms  - finasteride 5 mg daily and silodosin  8 mg daily   Chief Complaint  Patient presents with   Urinary Retention   HPI: Bruce Wyatt. is a 78 y.o. man who presents today for trial of void.   Previous records reviewed.  At previous visits,patient was hospitalized for sepsis from October 23 to October 26.  Foley catheter was placed October 24 with return of 650 mL of urine.  He was on tamsulosin prior to admission and they started finasteride 5 mg daily during this admission.  Foley removed November 3rd. He went home and pushed fluids, but he was under the impression that he needed to hold his urine until his appointment this afternoon.  He was quite uncomfortable.  He then voided in the office and was relieved.  PVR 224 mL  He states over the past few days he has found it more more difficult to empty his bladder.  He states the urine is just trickling out now.  He is very uncomfortable.  Patient denies any modifying or aggravating factors.  Patient denies any recent UTI's, gross hematuria, dysuria or suprapubic/flank pain.  Patient denies any fevers, chills, nausea or vomiting.  He states he has discontinued his Gemtesa . UA brown cloudy, specific gravity 1.025, pH 6.0, 2+ protein, 3+ heme, 1+ leukocyte, greater than 30 WBCs, greater than 30 RBCs, 0-10 epithelial cells and a few bacteria.  PVR 1006 mL.  Foley catheter placed.    He had his Foley removed yesterday and afternoon PVR was 147 mL.  We had him return this am because after his last voiding trial, he had a low PVR, but then ended up back in retention by the end of the week.  His PVR this am is 418 mL.  He is not uncomfortable, but he  has not voided today.  Patient denies any modifying or aggravating factors.  Patient denies any recent UTI's, gross hematuria, dysuria or suprapubic/flank pain.  Patient denies any fevers, chills, nausea or vomiting.    PMH: Past Medical History:  Diagnosis Date   Abnormal colonoscopy    polyps noted, benign, [facility], rec 10 year follow up   Alcoholism (HCC)    11 yrs ago    BPH (benign prostatic hyperplasia)    Diabetes mellitus    Diverticulosis    Gallstones    GERD (gastroesophageal reflux disease)    H/O hematuria    Hemorrhoids    Hyperlipidemia    Hypertension    Iron deficiency    Sleep apnea    Vitamin D deficiency     Surgical History: Past Surgical History:  Procedure Laterality Date   CARDIOVASCULAR STRESS TEST  02/12/2009   Negative/AT [facility]   COLONOSCOPY     CYSTOSCOPY  07/03/2012   RHINOPLASTY     soft palate reduction for apnea   Soft palate reduction for Sleep Apnea     [facility]   TONSILLECTOMY     UPPER GASTROINTESTINAL ENDOSCOPY     VASECTOMY      Home Medications:  Allergies as of 11/01/2024       Reactions   Ace Inhibitors    COUGH        Medication List  Accurate as of November 01, 2024 11:59 PM. If you have any questions, ask your nurse or doctor.          carbidopa-levodopa 25-100 MG tablet Commonly known as: SINEMET IR TAKE 2 TABLETS BY MOUTH THREE TIMES DAILY   cefUROXime 250 MG tablet Commonly known as: CEFTIN Take 1 tablet (250 mg total) by mouth 2 (two) times daily with a meal for 7 days.   cyanocobalamin  1000 MCG tablet Commonly known as: VITAMIN B12 Take 1,000 mcg by mouth daily.   finasteride 5 MG tablet Commonly known as: PROSCAR Take 1 tablet (5 mg total) by mouth daily.   Gemtesa  75 MG Tabs Generic drug: Vibegron  Take 1 tablet (75 mg total) by mouth daily.   metFORMIN  500 MG tablet Commonly known as: GLUCOPHAGE  TAKE ONE (1) TABLET BY MOUTH TWO TIMES PER DAY   multivitamin with  minerals tablet Take 1 tablet by mouth daily.   pantoprazole 40 MG tablet Commonly known as: PROTONIX TAKE 1 TABLET BY MOUTH TWICE DAILY   rosuvastatin  40 MG tablet Commonly known as: CRESTOR  TAKE 1 TABLET BY MOUTH DAILY   Rybelsus  14 MG Tabs Generic drug: Semaglutide  Take 1 tablet (14 mg total) by mouth daily.   silodosin  8 MG Caps capsule Commonly known as: RAPAFLO  TAKE 1 CAPSULE BY MOUTH ONCE DAILY WITH BREAKFAST   sulfamethoxazole-trimethoprim 800-160 MG tablet Commonly known as: BACTRIM DS Take 1 tablet by mouth every 12 (twelve) hours. Started by: Special Ranes   venlafaxine  XR 75 MG 24 hr capsule Commonly known as: EFFEXOR -XR TAKE 1 CAPSULE BY MOUTH ONCE DAILY   zolpidem  10 MG tablet Commonly known as: AMBIEN  Take 1 tablet (10 mg total) by mouth at bedtime.        Allergies:  Allergies  Allergen Reactions   Ace Inhibitors     COUGH    Family History: Family History  Problem Relation Age of Onset   Breast cancer Mother    Heart disease Father        Heart Attack   Colon cancer Maternal Grandfather    Diabetes Paternal Grandfather    Heart disease Paternal Grandfather    Coronary artery disease Father    Nephrolithiasis Daughter     Social History:  reports that he has never smoked. He has never used smokeless tobacco. He reports that he does not drink alcohol and does not use drugs.  ROS: Pertinent ROS in HPI  Physical Exam: BP 110/63 (BP Location: Left Arm, Patient Position: Sitting, Cuff Size: Large)   Pulse 70   Wt 202 lb (91.6 kg)   SpO2 97%   BMI 25.94 kg/m   Constitutional:  Well nourished. Alert and oriented, No acute distress. HEENT: [place] AT, moist mucus membranes.  Trachea midline Cardiovascular: No clubbing, cyanosis, or edema. Respiratory: Normal respiratory effort, no increased work of breathing. Neurologic: Grossly intact, no focal deficits, moving all 4 extremities. Psychiatric: Normal mood and affect.   Laboratory  Data: See Epic and HPI   I have reviewed the labs.   Pertinent Imaging:  11/01/24 10:14  Scan Result    Simple Catheter Placement Due to urinary retention patient is present today for a foley cath placement.  Patient was cleaned and prepped in a sterile fashion with betadine and 2% lidocaine  jelly was instilled into the urethra. A 18 FR foley catheter was inserted, urine return was noted  400 ml, urine was amber in color.  The balloon was filled with 10cc of sterile water.  A leg bag was attached for drainage. Patient was also given a night bag to take home and was given instruction on how to change from one bag to another.  Patient was given instruction on proper catheter care.  Patient tolerated well, no complications were noted   Performed by: CLOTILDA CORNWALL, PA-C and Andrea DELENA Kirks, LPN    Assessment & Plan:    1. Urinary retention - patient does not want to learn CIC - he will continue silodosin  and finasteride - discussed how his prostate and Parkinson's may be contributing to his inability to empty his bladder and he feels his Parkinson's is mild and not interfering with bladder function - may need to pursue UDS and/or cysto prior to considering an outlet procedure - we do not offer UDS at our facility, so this may be a challenge for patient to get to a clinic that offers UDS  Return for pending research about UDS facilities .  These notes generated with voice recognition software. I apologize for typographical errors.  CLOTILDA CORNWALL RIGGERS  [facility] [address]  [address] [place], [place] [address] [phone]

## 2024-11-03 ENCOUNTER — Encounter: Payer: Self-pay | Admitting: Urology

## 2024-11-04 ENCOUNTER — Telehealth: Payer: Self-pay

## 2024-11-04 NOTE — Telephone Encounter (Signed)
 Pt LM stating that there was a mix up with his medications, need to call him back and find out what medication he's referring to

## 2024-11-05 ENCOUNTER — Telehealth: Payer: Self-pay

## 2024-11-05 NOTE — Telephone Encounter (Signed)
Pt informed

## 2024-11-05 NOTE — Telephone Encounter (Signed)
 Pt states when he went to get a refill of his finasteride 5mg  the pharmacy said his provider did not recognize it. This was given to him at the hospital originally but pt now needs refills.

## 2024-11-06 ENCOUNTER — Other Ambulatory Visit

## 2024-11-06 NOTE — Transitions of Care (Post Inpatient/ED Visit) (Signed)
 Transition of Care week 4  Visit Note  11/06/2024  Name: Bruce Wyatt. MRN: [id number]          DOB: 08-27-1946  Situation: Patient enrolled in [facility] 30-day program. Visit completed with Elsie Dozier by telephone.   Background:   Initial Transition Care Management Follow-up Telephone Call Discharge Date and Diagnosis: 10/13/24, Sepsis due to UTI   Past Medical History:  Diagnosis Date   Abnormal colonoscopy    polyps noted, benign, [facility], rec 10 year follow up   Alcoholism (HCC)    11 yrs ago    BPH (benign prostatic hyperplasia)    Diabetes mellitus    Diverticulosis    Gallstones    GERD (gastroesophageal reflux disease)    H/O hematuria    Hemorrhoids    Hyperlipidemia    Hypertension    Iron deficiency    Sleep apnea    Vitamin D deficiency     Assessment: Patient Reported Symptoms: Cognitive Cognitive Status: Alert and oriented to person, place, and time, Normal speech and language skills      Neurological Neurological Review of Symptoms: Other: [place] Neurological Symptoms/Conditions [RPT]: Slight trembling in his right hand Neurological Management Strategies: Medication therapy Neurological Comment: Parkinson's  HEENT HEENT Symptoms Reported: Not assessed      Cardiovascular Cardiovascular Symptoms Reported: No symptoms reported    Respiratory Respiratory Symptoms Reported: No symptoms reported    Endocrine Endocrine Symptoms Reported: No symptoms reported    Gastrointestinal Gastrointestinal Symptoms Reported: No symptoms reported      Genitourinary Genitourinary Symptoms Reported: Difficulty initiating stream Additional Genitourinary Details: Enlarged Prostate. Unable to void. The patient has not passed voiding trials. Genitourinary Management Strategies: Catheter, indwelling Indwelling Catheter Inserted: 11/01/24 Genitourinary Comment: The patient would like to have the catheter out but he is unable to void on his own. He will follow up  with Urology 11/19/24 to discuss his options  Integumentary Integumentary Symptoms Reported: Not assessed    Musculoskeletal Musculoskelatal Symptoms Reviewed: No symptoms reported        Psychosocial Psychosocial Symptoms Reported: No symptoms reported         There were no vitals filed for this visit.    Medications Reviewed Today     Reviewed by Moises Reusing, RN (Case Manager) on 11/06/24 at 1535  Med List Status: <None>   Medication Order Taking? Sig Documenting Provider Last Dose Status Informant  carbidopa-levodopa (SINEMET IR) 25-100 MG tablet [id number]  TAKE 2 TABLETS BY MOUTH THREE TIMES DAILY [name], MD  Active Spouse/Significant Other, Pharmacy Records  cyanocobalamin  (VITAMIN B12) 1000 MCG tablet [id number]  Take 1,000 mcg by mouth daily. [provider]  Active Spouse/Significant Other, Pharmacy Records  finasteride (PROSCAR) 5 MG tablet [id number]  Take 1 tablet (5 mg total) by mouth daily. Maree Hue, MD  Active   metFORMIN  (GLUCOPHAGE ) 500 MG tablet [id number]  TAKE ONE (1) TABLET BY MOUTH TWO TIMES PER DAY [name], MD  Active   Multiple Vitamins-Minerals (MULTIVITAMIN WITH MINERALS) tablet [id number]  Take 1 tablet by mouth daily. [provider]  Active Spouse/Significant Other, Pharmacy Records  pantoprazole (PROTONIX) 40 MG tablet [id number]  TAKE 1 TABLET BY MOUTH TWICE DAILY Tejan-Sie, S Ahmed, MD  Active Spouse/Significant Other, Pharmacy Records  rosuvastatin  (CRESTOR ) 40 MG tablet [id number]  TAKE 1 TABLET BY MOUTH DAILY Tejan-Sie, GORMAN Dine, MD  Active   Semaglutide  (RYBELSUS ) 14 MG TABS [id number]  Take 1 tablet (14 mg total) by mouth daily.  Tejan-Sie, S Ahmed, MD  Active   silodosin  (RAPAFLO ) 8 MG CAPS capsule [id number]  TAKE 1 CAPSULE BY MOUTH ONCE DAILY WITH BREAKFAST Fernand Fredy RAMAN, MD  Active Spouse/Significant Other, Pharmacy Records  sulfamethoxazole -trimethoprim  (BACTRIM  DS) 800-160 MG tablet [id number]  Take 1 tablet  by mouth every 12 (twelve) hours. Helon Kirsch A, PA-C  Active   venlafaxine  XR (EFFEXOR -XR) 75 MG 24 hr capsule [id number]  TAKE 1 CAPSULE BY MOUTH ONCE DAILY Albina RAMAN Dine, MD  Active Spouse/Significant Other, Pharmacy Records  Vibegron  (GEMTESA ) 75 MG TABS [id number]  Take 1 tablet (75 mg total) by mouth daily.  Patient not taking: Reported on 11/01/2024   Twylla Glendia BROCKS, MD  Active Spouse/Significant Other, Pharmacy Records  zolpidem  (AMBIEN ) 10 MG tablet [id number]  Take 1 tablet (10 mg total) by mouth at bedtime. Albina RAMAN Dine, MD  Active             Recommendation:   Continue Current Plan of Care  Follow Up Plan:   Telephone follow-up in 1 week  Medford Balboa, BSN, RN [place]  VBCI - [facility] RN Care Manager [phone]

## 2024-11-06 NOTE — Patient Instructions (Signed)
 Visit Information  Thank you for taking time to visit with me today. Please don't hesitate to contact me if I can be of assistance to you before our next scheduled telephone appointment.  Our next appointment is by telephone on Wednesday December 3rd at 2:15pm  Following is a copy of your care plan:   Goals Addressed             This Visit's Progress    VBCI Transitions of Care (TOC) Care Plan       Problems: (reviewed 11/06/24) Recent Hospitalization for treatment of Sepsis due to UTI Knowledge Deficit Related to New Foley Catheter  Goal: (reviewed 11/06/24) Over the next 30 days, the patient will not experience hospital readmission  Interventions: (reviewed 11/06/24)  Evaluation of current treatment plan related to Overactive Bladder and Sepsis due to UTI; Urinary Retention, New Foley Catheter Discussed plans with patient for ongoing care management follow up and provided patient with direct contact information for care management team Reviewed medications with patient and discussed antibiotics, medications for overactive bladder and medications for enlarged prostate Advised patient to discuss Urinary retention and voiding trial with provider The patient currently has a foley catheter. The patient will need instruction for how to care for his catheter through [facility] with [facility] Finish antibiotics Amoxicillin  - Completed Foley catheter removed 10/21/24 - Watch for spasms, blood in urine 11/12 - Foley catheter reinserted due to abdominal pain, dysuria, urgency. Trial void set for 11/13 and 11/14 Ceftin  250mg  twice a day restarted - Complete 11/07/24 11/19 - The patient did not pass the voiding trial and has a foley catheter. He returns to the Urologist 11/19/24 for further evaluation Discussed infection control regarding Urinary bags, switching from night to day bag, cleansing the tips with alcohol, keep the bag below the level of the bladder, drink plenty of fluids  Patient Self  Care Activities:  Attend all scheduled provider appointments Call pharmacy for medication refills 3-7 days in advance of running out of medications Call provider office for new concerns or questions  Notify RN Care Manager of TOC call rescheduling needs Participate in Transition of Care Program/Attend TOC scheduled calls Perform all self care activities independently  Take medications as prescribed    Plan:  Telephone follow up appointment with care management team member scheduled for:  Wednesday December 3rd at 2:15pm        Patient verbalizes understanding of instructions and care plan provided today and agrees to view in MyChart. Active MyChart status and patient understanding of how to access instructions and care plan via MyChart confirmed with patient.     The patient has been provided with contact information for the care management team and has been advised to call with any health related questions or concerns.   Please call the care guide team at [phone] if you need to cancel or reschedule your appointment.   Please call the Suicide and Crisis Lifeline: 988 call the [place]  National Suicide Prevention Lifeline: 628-665-5140 or TTY: 669-442-8508 TTY 340-744-1610) to talk to a trained counselor if you are experiencing a Mental Health or Behavioral Health Crisis or need someone to talk to.  Medford Balboa, BSN, RN [place]  VBCI - [employer] [facility] RN Care Manager [phone]

## 2024-11-10 ENCOUNTER — Telehealth: Payer: Self-pay | Admitting: Urology

## 2024-11-10 NOTE — Telephone Encounter (Signed)
 Would you schedule Bruce Wyatt an appointment with Dr. Francisca to discuss possible HoLEP?  Please let me know when that appointment is, because we may need to schedule a catheter exchange prior to that appointment.

## 2024-11-13 ENCOUNTER — Other Ambulatory Visit: Payer: Self-pay

## 2024-11-13 ENCOUNTER — Telehealth: Payer: Self-pay | Admitting: Urology

## 2024-11-13 DIAGNOSIS — R338 Other retention of urine: Secondary | ICD-10-CM

## 2024-11-13 DIAGNOSIS — N401 Enlarged prostate with lower urinary tract symptoms: Secondary | ICD-10-CM

## 2024-11-13 DIAGNOSIS — N138 Other obstructive and reflux uropathy: Secondary | ICD-10-CM

## 2024-11-13 NOTE — Telephone Encounter (Signed)
 Surgical Physician Order Form [facility]   * Scheduling expectation : He is seeing Dr. Francisca on the 17th  *Length of Case:   *Clearance needed: no  *Anticoagulation Instructions: Hold all anticoagulants  *Aspirin Instructions: Hold Aspirin  *Post-op visit Date/Instructions:  TBD  *Diagnosis: BPH w/urinary obstruction  *Procedure:  HOLEP (47350)   Additional orders: N/A  -Admit type: OUTpatient  -Anesthesia: General  -VTE Prophylaxis Standing Order SCD's       Other:   -Standing Lab Orders Per Anesthesia    Lab other: UA&Urine Culture - he has an indwelling Foley   -Standing Test orders EKG/Chest x-ray per Anesthesia       Test other:   - Medications:  Ancef 2gm IV  -Other orders:  N/A

## 2024-11-13 NOTE — Progress Notes (Signed)
 Surgical Physician Order Form [facility] [place]  Dr. Redell Burnet, MD  * Scheduling expectation :  He is seeing Dr. Burnet on the 17th  *Length of Case:   *Clearance needed: no  *Anticoagulation Instructions: Hold all anticoagulants  *Aspirin Instructions: Hold Aspirin  *Post-op visit Date/Instructions:   TBD  *Diagnosis: BPH w/urinary obstruction  *Procedure:  HOLEP (47350)   Additional orders: N/A  -Admit type: OUTpatient  -Anesthesia: General  -VTE Prophylaxis Standing Order SCD's       Other:   -Standing Lab Orders Per Anesthesia    Lab other: UA&Urine Culture - he has an indwelling Foley   -Standing Test orders EKG/Chest x-ray per Anesthesia       Test other:   - Medications:  Ancef 2gm IV  -Other orders:  N/A

## 2024-11-19 ENCOUNTER — Ambulatory Visit: Admitting: Urology

## 2024-11-20 ENCOUNTER — Other Ambulatory Visit: Payer: Self-pay

## 2024-11-20 ENCOUNTER — Ambulatory Visit: Admitting: Urology

## 2024-11-20 NOTE — Transitions of Care (Post Inpatient/ED Visit) (Signed)
 Transition of Care week 5  Visit Note  11/20/2024  Name: Bruce Wyatt. MRN: [id number]          DOB: 12/21/1945  Situation: Patient enrolled in [facility] 30-day program. Visit completed with Elsie Dozier by telephone.   Background:   Initial Transition Care Management Follow-up Telephone Call Discharge Date and Diagnosis: No data recorded   Past Medical History:  Diagnosis Date   Abnormal colonoscopy    polyps noted, benign, [facility], rec 10 year follow up   Alcoholism (HCC)    11 yrs ago    BPH (benign prostatic hyperplasia)    Diabetes mellitus    Diverticulosis    Gallstones    GERD (gastroesophageal reflux disease)    H/O hematuria    Hemorrhoids    Hyperlipidemia    Hypertension    Iron deficiency    Sleep apnea    Vitamin D  deficiency     Assessment: Patient Reported Symptoms: Cognitive Cognitive Status: Alert and oriented to person, place, and time, Normal speech and language skills      Neurological Neurological Review of Symptoms: No symptoms reported Neurological Comment: Parkinson's  HEENT HEENT Symptoms Reported: No symptoms reported      Cardiovascular Cardiovascular Symptoms Reported: No symptoms reported    Respiratory Respiratory Symptoms Reported: No symptoms reported    Endocrine Endocrine Symptoms Reported: No symptoms reported    Gastrointestinal Gastrointestinal Symptoms Reported: No symptoms reported      Genitourinary Genitourinary Symptoms Reported: No symptoms reported Genitourinary Management Strategies: Catheter, indwelling Indwelling Catheter Inserted: 11/01/24 Genitourinary Comment: The patient states he is having no problems with the catheter.  Integumentary Integumentary Symptoms Reported: No symptoms reported    Musculoskeletal Musculoskelatal Symptoms Reviewed: No symptoms reported Musculoskeletal Comment: The patient reports he is doing everything he wants to do with no problems      Psychosocial Psychosocial Symptoms  Reported: No symptoms reported         There were no vitals filed for this visit.    Medications Reviewed Today     Reviewed by Moises Reusing, RN (Case Manager) on 11/20/24 at 1420  Med List Status: <None>   Medication Order Taking? Sig Documenting Provider Last Dose Status Informant  carbidopa -levodopa  (SINEMET  IR) 25-100 MG tablet [id number]  TAKE 2 TABLETS BY MOUTH THREE TIMES DAILY [name], MD  Active Spouse/Significant Other, Pharmacy Records  cyanocobalamin  (VITAMIN B12) 1000 MCG tablet [id number]  Take 1,000 mcg by mouth daily. [provider]  Active Spouse/Significant Other, Pharmacy Records  metFORMIN  (GLUCOPHAGE ) 500 MG tablet [id number]  TAKE ONE (1) TABLET BY MOUTH TWO TIMES PER DAY [name], MD  Active   Multiple Vitamins-Minerals (MULTIVITAMIN WITH MINERALS) tablet [id number]  Take 1 tablet by mouth daily. [provider]  Active Spouse/Significant Other, Pharmacy Records  pantoprazole  (PROTONIX ) 40 MG tablet [id number]  TAKE 1 TABLET BY MOUTH TWICE DAILY [name], MD  Active Spouse/Significant Other, Pharmacy Records  rosuvastatin  (CRESTOR ) 40 MG tablet [id number]  TAKE 1 TABLET BY MOUTH DAILY Tejan-Sie, GORMAN Dine, MD  Active   Semaglutide  (RYBELSUS ) 14 MG TABS [id number]  Take 1 tablet (14 mg total) by mouth daily. Tejan-Sie, S Ahmed, MD  Active   silodosin  (RAPAFLO ) 8 MG CAPS capsule [id number]  TAKE 1 CAPSULE BY MOUTH ONCE DAILY WITH BREAKFAST Fernand Fredy GORMAN, MD  Active Spouse/Significant Other, Pharmacy Records  sulfamethoxazole -trimethoprim  (BACTRIM  DS) 800-160 MG tablet [id number]  Take 1 tablet by mouth every 12 (twelve) hours. Helon Kirsch  A, PA-C  Active   venlafaxine  XR (EFFEXOR -XR) 75 MG 24 hr capsule [id number]  TAKE 1 CAPSULE BY MOUTH ONCE DAILY [name], MD  Active Spouse/Significant Other, Pharmacy Records  Vibegron  (GEMTESA ) 75 MG TABS [id number]  Take 1 tablet (75 mg total) by mouth daily.  Patient not  taking: Reported on 11/01/2024   Twylla Glendia BROCKS, MD  Active Spouse/Significant Other, Pharmacy Records  zolpidem  (AMBIEN ) 10 MG tablet [id number]  Take 1 tablet (10 mg total) by mouth at bedtime. Tejan-Sie, S Ahmed, MD  Active             Recommendation:   The patient has completed the 30 Day TOC program  Follow Up Plan:   Closing From:  Transitions of Care Program  Chao [facility], BSN, RN [place]  VBCI - [facility] RN Care Manager [phone]

## 2024-11-20 NOTE — Patient Instructions (Signed)
 Visit Information  Thank you for taking time to visit with me today. Please don't hesitate to contact me if I can be of assistance to you before our next scheduled telephone appointment.  Following is a copy of your care plan:   Goals Addressed             This Visit's Progress    COMPLETED: VBCI Transitions of Care (TOC) Care Plan       Problems: (reviewed 11/20/24) Recent Hospitalization for treatment of Sepsis due to UTI Knowledge Deficit Related to New Foley Catheter  Goal: (reviewed 11/20/24) Over the next 30 days, the patient will not experience hospital readmission  Interventions: (reviewed 11/20/24)  Evaluation of current treatment plan related to Overactive Bladder and Sepsis due to UTI; Urinary Retention, New Foley Catheter Discussed plans with patient for ongoing care management follow up and provided patient with direct contact information for care management team Reviewed medications with patient and discussed antibiotics, medications for overactive bladder and medications for enlarged prostate Advised patient to discuss Urinary retention and voiding trial with provider The patient currently has a foley catheter. The patient will need instruction for how to care for his catheter through [facility] with [facility] Finish antibiotics Amoxicillin  - Completed Foley catheter removed 10/21/24 - Watch for spasms, blood in urine 11/12 - Foley catheter reinserted due to abdominal pain, dysuria, urgency. Trial void set for 11/13 and 11/14 Ceftin  250mg  twice a day restarted - Complete 11/07/24 11/19 - The patient did not pass the voiding trial and has a foley catheter. He returns to the Urologist 11/19/24 for further evaluation Discussed infection control regarding Urinary bags, switching from night to day bag, cleansing the tips with alcohol, keep the bag below the level of the bladder, drink plenty of fluids Appointment with Dr. Francisca 12/04/24 to discuss HoLEP procedure that is to be done  in January 2026  Patient Self Care Activities: (reviewed 11/20/24) Attend all scheduled provider appointments Call pharmacy for medication refills 3-7 days in advance of running out of medications Call provider office for new concerns or questions  Notify RN Care Manager of TOC call rescheduling needs Participate in Transition of Care Program/Attend TOC scheduled calls Perform all self care activities independently  Take medications as prescribed    Plan:  Telephone follow up appointment with care management team member scheduled for:  the patient has completed the 30 TOC program. Goals are met        Patient verbalizes understanding of instructions and care plan provided today and agrees to view in MyChart. Active MyChart status and patient understanding of how to access instructions and care plan via MyChart confirmed with patient.     The patient has been provided with contact information for the care management team and has been advised to call with any health related questions or concerns.   Please call the care guide team at [phone] if you need to cancel or reschedule your appointment.   Please call the Suicide and Crisis Lifeline: 988 call the [place]  National Suicide Prevention Lifeline: 248-760-6606 or TTY: 5480755978 TTY (217)799-9450) to talk to a trained counselor if you are experiencing a Mental Health or Behavioral Health Crisis or need someone to talk to.  Medford Balboa, BSN, RN [place]  VBCI - [employer] [facility] RN Care Manager [phone]

## 2024-11-21 ENCOUNTER — Ambulatory Visit: Payer: Self-pay | Admitting: Urology

## 2024-11-22 ENCOUNTER — Other Ambulatory Visit: Payer: Self-pay | Admitting: Internal Medicine

## 2024-11-25 ENCOUNTER — Other Ambulatory Visit

## 2024-11-25 DIAGNOSIS — I10 Essential (primary) hypertension: Secondary | ICD-10-CM | POA: Diagnosis not present

## 2024-11-25 DIAGNOSIS — E119 Type 2 diabetes mellitus without complications: Secondary | ICD-10-CM | POA: Diagnosis not present

## 2024-11-25 LAB — HEMOGLOBIN A1C
Est. average glucose Bld gHb Est-mCnc: 126 mg/dL
Hgb A1c MFr Bld: 6 % — ABNORMAL HIGH (ref 4.8–5.6)

## 2024-11-26 ENCOUNTER — Ambulatory Visit: Payer: Self-pay | Admitting: Internal Medicine

## 2024-11-26 ENCOUNTER — Telehealth: Payer: Self-pay

## 2024-11-26 ENCOUNTER — Ambulatory Visit: Admitting: Internal Medicine

## 2024-11-26 ENCOUNTER — Ambulatory Visit: Admitting: Urology

## 2024-11-26 DIAGNOSIS — E1165 Type 2 diabetes mellitus with hyperglycemia: Secondary | ICD-10-CM | POA: Diagnosis not present

## 2024-11-26 DIAGNOSIS — E119 Type 2 diabetes mellitus without complications: Secondary | ICD-10-CM

## 2024-11-26 LAB — LIPID PANEL
Chol/HDL Ratio: 2.2 ratio (ref 0.0–5.0)
Cholesterol, Total: 130 mg/dL (ref 100–199)
HDL: 60 mg/dL (ref >39)
LDL Chol Calc (NIH): 52 mg/dL (ref 0–99)
Triglycerides: 98 mg/dL (ref 0–149)
VLDL Cholesterol Cal: 18 mg/dL (ref 5–40)

## 2024-11-26 LAB — COMPREHENSIVE METABOLIC PANEL WITH GFR
ALT: 10 IU/L (ref 0–44)
AST: 13 IU/L (ref 0–40)
Albumin: 4.3 g/dL (ref 3.8–4.8)
Alkaline Phosphatase: 84 IU/L (ref 47–123)
BUN/Creatinine Ratio: 15 (ref 10–24)
BUN: 13 mg/dL (ref 8–27)
Bilirubin Total: 0.5 mg/dL (ref 0.0–1.2)
CO2: 20 mmol/L (ref 20–29)
Calcium: 9.4 mg/dL (ref 8.6–10.2)
Chloride: 105 mmol/L (ref 96–106)
Creatinine, Ser: 0.84 mg/dL (ref 0.76–1.27)
Globulin, Total: 2.1 g/dL (ref 1.5–4.5)
Glucose: 124 mg/dL — ABNORMAL HIGH (ref 70–99)
Potassium: 4.2 mmol/L (ref 3.5–5.2)
Sodium: 140 mmol/L (ref 134–144)
Total Protein: 6.4 g/dL (ref 6.0–8.5)
eGFR: 89 mL/min/1.73 (ref >59)

## 2024-11-26 LAB — POCT CBG (FASTING - GLUCOSE)-MANUAL ENTRY: Glucose Fasting, POC: 105 mg/dL — AB (ref 70–99)

## 2024-11-26 NOTE — Progress Notes (Signed)
   [facility] Surgical Posting Form  Surgery Date: Date: 12/30/2024  Surgeon: Dr. 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

## 2024-11-26 NOTE — Progress Notes (Signed)
 Established Patient Office Visit  Subjective:  Patient ID: Bruce Ra., male    DOB: 06-26-1946  Age: 78 y.o. MRN: [id number]  Chief Complaint  Patient presents with  . Follow-up    4 weeks follow up lab results    No new complaints, here for lab review and medication refills. Labs reviewed and notable for well controlled diabetes, A1c improved and remains at target, lipids at target with unremarkable cmp. Denies any hypoglycemic episodes and home bg readings have been at target.      No other concerns at this time.   Past Medical History:  Diagnosis Date  . Abnormal colonoscopy    polyps noted, benign, [facility], rec 10 year follow up  . Alcoholism (HCC)    11 yrs ago   . BPH (benign prostatic hyperplasia)   . Diabetes mellitus   . Diverticulosis   . Gallstones   . GERD (gastroesophageal reflux disease)   . H/O hematuria   . Hemorrhoids   . Hyperlipidemia   . Hypertension   . Iron deficiency   . Sleep apnea   . Vitamin D  deficiency     Past Surgical History:  Procedure Laterality Date  . CARDIOVASCULAR STRESS TEST  02/12/2009   Negative/AT [facility] Dr Lynwood Bare  . COLONOSCOPY    . CYSTOSCOPY  07/03/2012  . RHINOPLASTY     soft palate reduction for apnea  . Soft palate reduction for Sleep Apnea     [facility]  . TONSILLECTOMY    . UPPER GASTROINTESTINAL ENDOSCOPY    . VASECTOMY      Social History   Socioeconomic History  . Marital status: Married    Spouse name: Not on file  . Number of children: 2  . Years of education: Not on file  . Highest education level: Not on file  Occupational History  . Occupation: VP of HR    Employer: [employer]  Tobacco Use  . Smoking status: Never  . Smokeless tobacco: Never  Substance and Sexual Activity  . Alcohol use: No    Comment: quit 2002  . Drug use: No  . Sexual activity: Not Currently  Other Topics Concern  . Not on file  Social History Narrative   Lives in [place], [place] with wife.    2  daughters   No pets. Hobbies: fishing.   Textile management, [employer]   Regular Exercise -  NO   Daily Caffeine Use:  2 -4 coffee    No current alcohol history of alcoholism   No tobacco   Social Drivers of Corporate Investment Banker Strain: Not on file  Food Insecurity: No Food Insecurity (10/14/2024)   Hunger Vital Sign   . Worried About Programme Researcher, Broadcasting/film/video in the Last Year: Never true   . Ran Out of Food in the Last Year: Never true  Transportation Needs: No Transportation Needs (10/14/2024)   PRAPARE - Transportation   . Lack of Transportation (Medical): No   . Lack of Transportation (Non-Medical): No  Physical Activity: Not on file  Stress: Not on file  Social Connections: Socially Integrated (10/10/2024)   Social Connection and Isolation Panel   . Frequency of Communication with Friends and Family: More than three times a week   . Frequency of Social Gatherings with Friends and Family: More than three times a week   . Attends Religious Services: 1 to 4 times per year   . Active Member of Clubs or Organizations:  Yes   . Attends Club or Organization Meetings: 1 to 4 times per year   . Marital Status: Married  Catering Manager Violence: Not At Risk (10/14/2024)   Humiliation, Afraid, Rape, and Kick questionnaire   . Fear of Current or Ex-Partner: No   . Emotionally Abused: No   . Physically Abused: No   . Sexually Abused: No    Family History  Problem Relation Age of Onset  . Breast cancer Mother   . Heart disease Father        Heart Attack  . Colon cancer Maternal Grandfather   . Diabetes Paternal Grandfather   . Heart disease Paternal Grandfather   . Coronary artery disease Father   . Nephrolithiasis Daughter     Allergies  Allergen Reactions  . Ace Inhibitors     COUGH    Outpatient Medications Prior to Visit  Medication Sig  . carbidopa -levodopa  (SINEMET  IR) 25-100 MG tablet TAKE 2 TABLETS BY MOUTH THREE TIMES DAILY  . cyanocobalamin  (VITAMIN B12)  1000 MCG tablet Take 1,000 mcg by mouth daily.  . metFORMIN  (GLUCOPHAGE ) 500 MG tablet TAKE ONE (1) TABLET BY MOUTH TWO TIMES PER DAY  . Multiple Vitamins-Minerals (MULTIVITAMIN WITH MINERALS) tablet Take 1 tablet by mouth daily.  . pantoprazole  (PROTONIX ) 40 MG tablet TAKE 1 TABLET BY MOUTH TWICE DAILY  . rosuvastatin  (CRESTOR ) 40 MG tablet TAKE 1 TABLET BY MOUTH DAILY  . Semaglutide  (RYBELSUS ) 14 MG TABS Take 1 tablet (14 mg total) by mouth daily.  . silodosin  (RAPAFLO ) 8 MG CAPS capsule TAKE 1 CAPSULE BY MOUTH ONCE DAILY WITH BREAKFAST  . sulfamethoxazole -trimethoprim  (BACTRIM  DS) 800-160 MG tablet Take 1 tablet by mouth every 12 (twelve) hours.  . venlafaxine  XR (EFFEXOR -XR) 75 MG 24 hr capsule TAKE 1 CAPSULE BY MOUTH ONCE DAILY  . zolpidem  (AMBIEN ) 10 MG tablet Take 1 tablet (10 mg total) by mouth at bedtime.  . Vibegron  (GEMTESA ) 75 MG TABS Take 1 tablet (75 mg total) by mouth daily. (Patient not taking: Reported on 11/01/2024)   No facility-administered medications prior to visit.    Review of Systems  HENT: Negative.    Eyes: Negative.   Respiratory: Negative.    Cardiovascular: Negative.   Gastrointestinal: Negative.   Genitourinary:  Negative for dysuria, frequency and urgency.       Nocturia  Musculoskeletal: Negative.   Skin: Negative.   Neurological: Negative.   Endo/Heme/Allergies: Negative.        Objective:   BP 130/60   Pulse (!) 53   Temp (!) 96.2 F (35.7 C) (Tympanic)   Ht 6' 2 (1.88 m)   Wt 197 lb (89.4 kg)   SpO2 99%   BMI 25.29 kg/m   Vitals:   11/26/24 1026  BP: 130/60  Pulse: (!) 53  Temp: (!) 96.2 F (35.7 C)  Height: 6' 2 (1.88 m)  Weight: 197 lb (89.4 kg)  SpO2: 99%  TempSrc: Tympanic  BMI (Calculated): 25.28    Physical Exam Vitals and nursing note reviewed.  Constitutional:      Appearance: Normal appearance. He is overweight.  HENT:     Head: Normocephalic.     Left Ear: There is no impacted cerumen.     Nose: Nose  normal.     Mouth/Throat:     Mouth: Mucous membranes are moist.     Pharynx: No posterior oropharyngeal erythema.  Eyes:     Extraocular Movements: Extraocular movements intact.     Pupils: Pupils are equal,  round, and reactive to light.  Cardiovascular:     Rate and Rhythm: Regular rhythm.     Chest Wall: PMI is not displaced.     Pulses: Normal pulses.     Heart sounds: Normal heart sounds. No murmur heard. Pulmonary:     Effort: Pulmonary effort is normal.     Breath sounds: Normal air entry. No rhonchi or rales.  Abdominal:     General: Abdomen is flat. Bowel sounds are normal. There is no distension.     Palpations: Abdomen is soft. There is no hepatomegaly, splenomegaly or mass.     Tenderness: There is no abdominal tenderness.  Musculoskeletal:        General: Normal range of motion.     Cervical back: Normal range of motion and neck supple.     Right lower leg: No edema.     Left lower leg: No edema.  Skin:    General: Skin is warm and dry.  Neurological:     General: No focal deficit present.     Mental Status: He is alert and oriented to person, place, and time.     Cranial Nerves: No cranial nerve deficit.     Motor: Tremor present. No weakness.  Psychiatric:        Mood and Affect: Mood normal.        Behavior: Behavior normal.      Results for orders placed or performed in visit on 11/26/24  POCT CBG (Fasting - Glucose)  Result Value Ref Range   Glucose Fasting, POC 105 (A) 70 - 99 mg/dL        Assessment & Plan:  Airon was seen today for follow-up.  Type 2 diabetes mellitus without complication, without long-term current use of insulin  (HCC) -     POCT CBG (Fasting - Glucose) -     POCT UA - Microalbumin    Problem List Items Addressed This Visit       Endocrine   Type 2 diabetes mellitus without complication, without long-term current use of insulin  (HCC)   Relevant Orders   POCT CBG (Fasting - Glucose) (Completed)   POCT Urine  Albumin/Creatinine with ratio [[id number]]    Return in about 3 months (around 02/24/2025) for fu with labs prior.   Total time spent: 20 minutes. This time includes review of previous notes and results and patient face to face interaction during today'Aaditya Letizia visit.    Sherrill Cinderella Perry, MD  11/26/2024   This document may have been prepared by [facility] Voice Recognition software and as such may include unintentional dictation errors.

## 2024-11-26 NOTE — Telephone Encounter (Signed)
 Per Dr. Francisca,  Patient is to be scheduled for Holmium Laser Enucleation of the Prostate    Bruce Wyatt was contacted and possible surgical dates were discussed, Monday January 12th, 2026 was agreed upon for surgery.   Patient was instructed that Dr. Francisca will require them to provide a pre-op UA & CX prior to surgery. This was ordered and scheduled drop off appointment was made for 12/04/2024.    Patient was directed to call [phone] between 1-3pm the day before surgery to find out surgical arrival time.  Instructions were given not to eat or drink from midnight on the night before surgery and have a driver for the day of surgery. On the surgery day patient was instructed to enter through the [facility] entrance of [facility] report the Same Day Surgery desk.   Pre-Admit Testing will be in contact via phone to set up an interview with the anesthesia team to review your history and medications prior to surgery.   Reminder of this information was sent via MyChart to the patient.

## 2024-12-04 ENCOUNTER — Ambulatory Visit: Admitting: Urology

## 2024-12-04 VITALS — BP 148/79 | HR 64 | Ht 74.0 in | Wt 197.0 lb

## 2024-12-04 DIAGNOSIS — Z01818 Encounter for other preprocedural examination: Secondary | ICD-10-CM

## 2024-12-04 DIAGNOSIS — N401 Enlarged prostate with lower urinary tract symptoms: Secondary | ICD-10-CM

## 2024-12-04 DIAGNOSIS — N138 Other obstructive and reflux uropathy: Secondary | ICD-10-CM | POA: Diagnosis not present

## 2024-12-04 NOTE — Patient Instructions (Signed)

## 2024-12-04 NOTE — Progress Notes (Signed)
° °  12/04/2024 2:16 PM   Bruce Wyatt. 1946-04-25 [id number]  Reason for visit: BPH and retention, recurrent UTI, discuss HOLEP  History: Previously followed by Dr. 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  Has failed multiple voiding trials and reasonable to consider definitive outlet procedure.  We reviewed HOLEP has a 95% success rate even in patients with poorly functioning bladder.  He understands risks of surgery and would still like to pursue HOLEP to try to resume spontaneous voiding.  We also reviewed may require an OAB medication or other treatments afterwards but would start with no medications for the first 2 to 3 months and see where his bladder function settles. We discussed the risks and benefits of HoLEP at length.  The procedure requires general anesthesia and takes 1 to 2 hours, and a holmium laser is used to enucleate the prostate and push this tissue into the bladder.  A morcellator is then used to remove this tissue, which is sent for pathology.  The vast majority(>95%) of patients are able to discharge the same day with a catheter in place for 2 to 3 days, and  will follow-up in clinic for a voiding trial.  We specifically discussed the risks of bleeding, infection, retrograde ejaculation, temporary urgency and urge incontinence, very low risk of long-term incontinence, urethral stricture/bladder neck contracture, pathologic evaluation of prostate tissue and possible detection of prostate cancer or other malignancy, and possible need for additional procedures. Foley exchanged today, see nursing note, urine sent for preop culture HOLEP(90 g) 12/30/2024   Bruce JAYSON Burnet, MD  [facility] [address], [address] [place], [place] [address] [phone]

## 2024-12-04 NOTE — Progress Notes (Signed)
 Cath Change/ Replacement  Patient is present today for a catheter change due to urinary retention.  9ml of water was removed from the balloon, a 18FR foley cath was removed without difficulty.  Patient was cleaned and prepped in a sterile fashion with betadine and 2% lidocaine  jelly was instilled into the urethra. A 18 FR Coude foley cath was replaced into the bladder, no complications were noted. Urine return was noted 10ml and urine was Yellow and cloudy in color. The balloon was filled with 10ml of sterile water. A Leg bag was attached for drainage.  A night bag was also given to the patient and patient was given instruction on how to change from one bag to another. Patient was given proper instruction on catheter care.    Performed by: Beauford Browner, CCMA  Follow up:  Surgery Scheduled

## 2024-12-05 LAB — URINALYSIS, COMPLETE
Bilirubin, UA: NEGATIVE
Glucose, UA: NEGATIVE
Nitrite, UA: POSITIVE — AB
Specific Gravity, UA: 1.03 (ref 1.005–1.030)
Urobilinogen, Ur: 1 mg/dL (ref 0.2–1.0)
pH, UA: 6 (ref 5.0–7.5)

## 2024-12-05 LAB — MICROSCOPIC EXAMINATION
RBC, Urine: >30 /HPF — AB (ref 0–2)
WBC, UA: >30 /HPF — AB (ref 0–5)

## 2024-12-07 LAB — CULTURE, URINE COMPREHENSIVE

## 2024-12-09 ENCOUNTER — Ambulatory Visit: Payer: Self-pay | Admitting: Urology

## 2024-12-10 NOTE — Progress Notes (Signed)
 LM for patient to return my call.

## 2024-12-11 ENCOUNTER — Other Ambulatory Visit: Payer: Self-pay

## 2024-12-11 ENCOUNTER — Encounter: Payer: Self-pay | Admitting: Emergency Medicine

## 2024-12-11 ENCOUNTER — Emergency Department: Admission: EM | Admit: 2024-12-11 | Discharge: 2024-12-11 | Disposition: A | Discharge status: Home / Self Care

## 2024-12-11 DIAGNOSIS — T83091A Other mechanical complication of indwelling urethral catheter, initial encounter: Secondary | ICD-10-CM

## 2024-12-11 DIAGNOSIS — Y732 Prosthetic and other implants, materials and accessory gastroenterology and urology devices associated with adverse incidents: Secondary | ICD-10-CM | POA: Insufficient documentation

## 2024-12-11 DIAGNOSIS — T83098A Other mechanical complication of other indwelling urethral catheter, initial encounter: Secondary | ICD-10-CM | POA: Insufficient documentation

## 2024-12-11 DIAGNOSIS — E119 Type 2 diabetes mellitus without complications: Secondary | ICD-10-CM | POA: Insufficient documentation

## 2024-12-11 DIAGNOSIS — I10 Essential (primary) hypertension: Secondary | ICD-10-CM | POA: Insufficient documentation

## 2024-12-11 NOTE — ED Provider Notes (Signed)
" ° °  [facility] Provider Note    Event Date/Time   First MD Initiated Contact with Patient 12/11/24 1959     (approximate)   History   Urinary Retention   HPI  Bruce Wyatt. is a 78 y.o. male with hypertension, hyperlipidemia, diabetes, IDA, BPH scheduled for enucleation in January and as listed in EMR presents to the emergency department for evaluation of Foley catheter.  This afternoon, he accidentally tugged on his Foley and it came out a few inches but then he pushed it back in.  Since that time, bag has not been draining and he feels pressure in his lower abdomen.     Physical Exam    Vitals:   12/11/24 1943  BP: (!) 161/94  Pulse: 99  Resp: 18  Temp: 98.7 F (37.1 C)  SpO2: 99%    General: Awake, no distress.  CV:  Good peripheral perfusion.  Resp:  Normal effort.  Abd:  No distention.  Other:  Suprapubic tenderness.  Scant amount of dried blood on inner thigh just under the urethral meatus/catheter   ED Results / Procedures / Treatments   Labs (all labs ordered are listed, but only abnormal results are displayed)  Labs Reviewed - No data to display   EKG  Not indicated   RADIOLOGY  Image and radiology report reviewed and interpreted by me. Radiology report consistent with the same.  Not indicated  PROCEDURES:  Critical Care performed: No  Procedures   MEDICATIONS ORDERED IN ED:  Medications - No data to display   IMPRESSION / MDM / ASSESSMENT AND PLAN / ED COURSE   I have reviewed the triage note and vital signs. Vital signs are stable   Differential diagnosis includes, but is not limited to, Foley catheter occlusion, Foley catheter dislodged  Patient's presentation is most consistent with acute illness / injury with system symptoms.  78 year old male presenting to the emergency department for treatment and evaluation of Foley catheter not draining after it was accidentally tugged on earlier today.   See HPI for further details.  Attempted to flush the catheter.  It flushed easily but would not draw back. Only 6ml of fluid was in the balloon.   Foley was removed and 3F inserted without difficulty and immediate return of pink urine that cleared within a few minutes. out at my last check and patient reports feeling much relief. Home care and outpatient follow up discussed.      FINAL CLINICAL IMPRESSION(S) / ED DIAGNOSES   Final diagnoses:  Obstruction of Foley catheter, initial encounter     Rx / DC Orders   ED Discharge Orders     None        Note:  This document was prepared using Dragon voice recognition software and may include unintentional dictation errors.   Herlinda Kirk NOVAK, FNP 12/11/24 2125    Fernand Rossie HERO, MD 12/11/24 2249  "

## 2024-12-11 NOTE — Discharge Instructions (Signed)
 Follow up with urology as needed/scheduled.  Return to the ER for symptoms of concern if unable to schedule an appointment.

## 2024-12-11 NOTE — ED Triage Notes (Signed)
 Pt arrives POV via WC reporting having a foley cath in place x couple weeks. Pt reports cath was pulled out some today when he got his leg bag hung on something. Foley still in place but no longer draining and pt feels pressure in lower abd.

## 2024-12-14 ENCOUNTER — Inpatient Hospital Stay

## 2024-12-14 ENCOUNTER — Emergency Department

## 2024-12-14 ENCOUNTER — Inpatient Hospital Stay
Admission: EM | Admit: 2024-12-14 | Discharge: 2024-12-16 | DRG: 699 | Disposition: A | Discharge status: Home / Self Care | Attending: Osteopathic Medicine | Admitting: Osteopathic Medicine

## 2024-12-14 ENCOUNTER — Other Ambulatory Visit: Payer: Self-pay

## 2024-12-14 DIAGNOSIS — G20A1 Parkinson's disease without dyskinesia, without mention of fluctuations: Secondary | ICD-10-CM | POA: Diagnosis present

## 2024-12-14 DIAGNOSIS — I10 Essential (primary) hypertension: Secondary | ICD-10-CM | POA: Diagnosis present

## 2024-12-14 DIAGNOSIS — Z79899 Other long term (current) drug therapy: Secondary | ICD-10-CM

## 2024-12-14 DIAGNOSIS — T83511A Infection and inflammatory reaction due to indwelling urethral catheter, initial encounter: Secondary | ICD-10-CM | POA: Diagnosis not present

## 2024-12-14 DIAGNOSIS — R339 Retention of urine, unspecified: Secondary | ICD-10-CM | POA: Diagnosis present

## 2024-12-14 DIAGNOSIS — Z833 Family history of diabetes mellitus: Secondary | ICD-10-CM

## 2024-12-14 DIAGNOSIS — Y738 Miscellaneous gastroenterology and urology devices associated with adverse incidents, not elsewhere classified: Secondary | ICD-10-CM | POA: Diagnosis present

## 2024-12-14 DIAGNOSIS — A419 Sepsis, unspecified organism: Principal | ICD-10-CM

## 2024-12-14 DIAGNOSIS — E785 Hyperlipidemia, unspecified: Secondary | ICD-10-CM | POA: Diagnosis present

## 2024-12-14 DIAGNOSIS — G4730 Sleep apnea, unspecified: Secondary | ICD-10-CM | POA: Diagnosis present

## 2024-12-14 DIAGNOSIS — N390 Urinary tract infection, site not specified: Secondary | ICD-10-CM | POA: Diagnosis present

## 2024-12-14 DIAGNOSIS — Z7984 Long term (current) use of oral hypoglycemic drugs: Secondary | ICD-10-CM

## 2024-12-14 DIAGNOSIS — B961 Klebsiella pneumoniae [K. pneumoniae] as the cause of diseases classified elsewhere: Secondary | ICD-10-CM | POA: Diagnosis present

## 2024-12-14 DIAGNOSIS — Z803 Family history of malignant neoplasm of breast: Secondary | ICD-10-CM

## 2024-12-14 DIAGNOSIS — Z1152 Encounter for screening for COVID-19: Secondary | ICD-10-CM | POA: Diagnosis not present

## 2024-12-14 DIAGNOSIS — Z888 Allergy status to other drugs, medicaments and biological substances status: Secondary | ICD-10-CM

## 2024-12-14 DIAGNOSIS — G4700 Insomnia, unspecified: Secondary | ICD-10-CM | POA: Diagnosis present

## 2024-12-14 DIAGNOSIS — R55 Syncope and collapse: Secondary | ICD-10-CM | POA: Diagnosis not present

## 2024-12-14 DIAGNOSIS — K219 Gastro-esophageal reflux disease without esophagitis: Secondary | ICD-10-CM | POA: Diagnosis present

## 2024-12-14 DIAGNOSIS — Z800 Family history of malignant neoplasm of digestive organs: Secondary | ICD-10-CM

## 2024-12-14 DIAGNOSIS — T83091A Other mechanical complication of indwelling urethral catheter, initial encounter: Secondary | ICD-10-CM | POA: Diagnosis present

## 2024-12-14 DIAGNOSIS — Z8249 Family history of ischemic heart disease and other diseases of the circulatory system: Secondary | ICD-10-CM

## 2024-12-14 DIAGNOSIS — N400 Enlarged prostate without lower urinary tract symptoms: Secondary | ICD-10-CM | POA: Diagnosis present

## 2024-12-14 DIAGNOSIS — E119 Type 2 diabetes mellitus without complications: Secondary | ICD-10-CM | POA: Diagnosis present

## 2024-12-14 LAB — CBC
HCT: 37.1 % — ABNORMAL LOW (ref 39.0–52.0)
HCT: 33.9 % — ABNORMAL LOW (ref 39.0–52.0)
Hemoglobin: 12.7 g/dL — ABNORMAL LOW (ref 13.0–17.0)
Hemoglobin: 11.2 g/dL — ABNORMAL LOW (ref 13.0–17.0)
MCH: 29.5 pg (ref 26.0–34.0)
MCH: 29.2 pg (ref 26.0–34.0)
MCHC: 34.2 g/dL (ref 30.0–36.0)
MCHC: 33 g/dL (ref 30.0–36.0)
MCV: 86.3 fL (ref 80.0–100.0)
MCV: 88.5 fL (ref 80.0–100.0)
Platelets: 149 K/uL — ABNORMAL LOW (ref 150–400)
Platelets: 127 K/uL — ABNORMAL LOW (ref 150–400)
RBC: 4.3 MIL/uL (ref 4.22–5.81)
RBC: 3.83 MIL/uL — ABNORMAL LOW (ref 4.22–5.81)
RDW: 13.2 % (ref 11.5–15.5)
RDW: 13.2 % (ref 11.5–15.5)
WBC: 10.9 K/uL — ABNORMAL HIGH (ref 4.0–10.5)
WBC: 9.1 K/uL (ref 4.0–10.5)
nRBC: 0 % (ref 0.0–0.2)
nRBC: 0 % (ref 0.0–0.2)

## 2024-12-14 LAB — LACTIC ACID, PLASMA: Lactic Acid, Venous: 1.7 mmol/L (ref 0.5–1.9)

## 2024-12-14 LAB — URINALYSIS, ROUTINE W REFLEX MICROSCOPIC
Bilirubin Urine: NEGATIVE
Glucose, UA: NEGATIVE mg/dL
Ketones, ur: NEGATIVE mg/dL
Nitrite: NEGATIVE
Protein, ur: >=300 mg/dL — AB
RBC / HPF: >50 RBC/hpf (ref 0–5)
Specific Gravity, Urine: 1.02 (ref 1.005–1.030)
WBC, UA: >50 WBC/hpf (ref 0–5)
pH: 7 (ref 5.0–8.0)

## 2024-12-14 LAB — RESP PANEL BY RT-PCR (RSV, FLU A&B, COVID)  RVPGX2
Influenza A by PCR: NEGATIVE
Influenza B by PCR: NEGATIVE
Resp Syncytial Virus by PCR: NEGATIVE
SARS Coronavirus 2 by RT PCR: NEGATIVE

## 2024-12-14 LAB — COMPREHENSIVE METABOLIC PANEL WITH GFR
ALT: 13 U/L (ref 0–44)
AST: 15 U/L (ref 15–41)
Albumin: 3.9 g/dL (ref 3.5–5.0)
Alkaline Phosphatase: 82 U/L (ref 38–126)
Anion gap: 12 (ref 5–15)
BUN: 15 mg/dL (ref 8–23)
CO2: 22 mmol/L (ref 22–32)
Calcium: 8.8 mg/dL — ABNORMAL LOW (ref 8.9–10.3)
Chloride: 103 mmol/L (ref 98–111)
Creatinine, Ser: 0.83 mg/dL (ref 0.61–1.24)
GFR, Estimated: >60 mL/min
Glucose, Bld: 170 mg/dL — ABNORMAL HIGH (ref 70–99)
Potassium: 3.9 mmol/L (ref 3.5–5.1)
Sodium: 137 mmol/L (ref 135–145)
Total Bilirubin: 1 mg/dL (ref 0.0–1.2)
Total Protein: 6.4 g/dL — ABNORMAL LOW (ref 6.5–8.1)

## 2024-12-14 LAB — CREATININE, SERUM
Creatinine, Ser: 0.78 mg/dL (ref 0.61–1.24)
GFR, Estimated: >60 mL/min

## 2024-12-14 MED ORDER — CARBIDOPA-LEVODOPA 25-100 MG PO TABS
2.0000 | ORAL_TABLET | Freq: Three times a day (TID) | ORAL | Status: DC
  Administered 2024-12-14 – 2024-12-16 (×5): 2 via ORAL
  Filled 2024-12-14 (×4): qty 2

## 2024-12-14 MED ORDER — SEMAGLUTIDE 14 MG PO TABS: 1.0000 | ORAL_TABLET | Freq: Every day | ORAL | Status: DC

## 2024-12-14 MED ORDER — SODIUM CHLORIDE 0.9 % IV SOLN
2.0000 g | Freq: Once | INTRAVENOUS | Status: AC
  Administered 2024-12-14: 2 g via INTRAVENOUS
  Filled 2024-12-14: qty 12.5

## 2024-12-14 MED ORDER — SODIUM CHLORIDE 0.9 % IV BOLUS
1000.0000 mL | Freq: Once | INTRAVENOUS | Status: AC
  Administered 2024-12-14: 1000 mL via INTRAVENOUS

## 2024-12-14 MED ORDER — METFORMIN HCL 500 MG PO TABS
500.0000 mg | ORAL_TABLET | Freq: Every day | ORAL | Status: DC
  Administered 2024-12-15 – 2024-12-16 (×2): 500 mg via ORAL
  Filled 2024-12-14: qty 1

## 2024-12-14 MED ORDER — VENLAFAXINE HCL ER 75 MG PO CP24
75.0000 mg | ORAL_CAPSULE | Freq: Every day | ORAL | Status: DC
  Administered 2024-12-15 – 2024-12-16 (×2): 75 mg via ORAL
  Filled 2024-12-14: qty 1

## 2024-12-14 MED ORDER — CYANOCOBALAMIN 500 MCG PO TABS
1000.0000 ug | ORAL_TABLET | Freq: Every day | ORAL | Status: DC
  Administered 2024-12-15 – 2024-12-16 (×2): 1000 ug via ORAL
  Filled 2024-12-14: qty 2

## 2024-12-14 MED ORDER — PANTOPRAZOLE SODIUM 40 MG PO TBEC
40.0000 mg | DELAYED_RELEASE_TABLET | Freq: Two times a day (BID) | ORAL | Status: DC
  Administered 2024-12-14 – 2024-12-16 (×4): 40 mg via ORAL
  Filled 2024-12-14 (×3): qty 1

## 2024-12-14 MED ORDER — METRONIDAZOLE 500 MG/100ML IV SOLN
500.0000 mg | Freq: Once | INTRAVENOUS | Status: AC
  Administered 2024-12-14: 500 mg via INTRAVENOUS
  Filled 2024-12-14: qty 100

## 2024-12-14 MED ORDER — SODIUM CHLORIDE 0.9 % IV SOLN
1.0000 g | INTRAVENOUS | Status: DC
  Administered 2024-12-15 – 2024-12-16 (×2): 1 g via INTRAVENOUS
  Filled 2024-12-14 (×2): qty 10

## 2024-12-14 MED ORDER — ADULT MULTIVITAMIN W/MINERALS CH
1.0000 | ORAL_TABLET | Freq: Every day | ORAL | Status: DC
  Administered 2024-12-15 – 2024-12-16 (×2): 1 via ORAL
  Filled 2024-12-14: qty 1

## 2024-12-14 MED ORDER — VANCOMYCIN HCL IN DEXTROSE 1-5 GM/200ML-% IV SOLN
1000.0000 mg | Freq: Once | INTRAVENOUS | Status: AC
  Administered 2024-12-14: 1000 mg via INTRAVENOUS
  Filled 2024-12-14: qty 200

## 2024-12-14 MED ORDER — TAMSULOSIN HCL 0.4 MG PO CAPS
0.4000 mg | ORAL_CAPSULE | Freq: Every day | ORAL | Status: DC
  Administered 2024-12-15 – 2024-12-16 (×2): 0.4 mg via ORAL
  Filled 2024-12-14: qty 1

## 2024-12-14 MED ORDER — ACETAMINOPHEN 500 MG PO TABS
1000.0000 mg | ORAL_TABLET | Freq: Once | ORAL | Status: AC
  Administered 2024-12-14: 1000 mg via ORAL
  Filled 2024-12-14: qty 2

## 2024-12-14 MED ORDER — ROSUVASTATIN CALCIUM 20 MG PO TABS
40.0000 mg | ORAL_TABLET | Freq: Every day | ORAL | Status: DC
  Administered 2024-12-14 – 2024-12-16 (×3): 40 mg via ORAL
  Filled 2024-12-14 (×2): qty 2

## 2024-12-14 MED ORDER — ZOLPIDEM TARTRATE 5 MG PO TABS
10.0000 mg | ORAL_TABLET | Freq: Every day | ORAL | Status: DC
  Administered 2024-12-14 – 2024-12-15 (×2): 10 mg via ORAL
  Filled 2024-12-14 (×2): qty 2

## 2024-12-14 MED ORDER — ENOXAPARIN SODIUM 40 MG/0.4ML IJ SOSY
40.0000 mg | PREFILLED_SYRINGE | Freq: Every day | INTRAMUSCULAR | Status: DC
  Administered 2024-12-14 – 2024-12-15 (×2): 40 mg via SUBCUTANEOUS
  Filled 2024-12-14 (×2): qty 0.4

## 2024-12-14 NOTE — ED Triage Notes (Signed)
 Pt to ED via [facility] from home for AMS/ possible UTI. Pt complains of weakness, pain in abd/catheter site and general malaise. Per wife pt was here Xmas eve for a new foley, hasn't felt good since, today pt having difficulty trying to stand. Wife states some confusion but not much. Wife states about 2-3 wks ago was here for same and had UTI which he was admitted for. Wife also stating pt passed out twice on 12/25 with head hitting door on [address]. Wife states she was able to catch him but head did hit the door.

## 2024-12-14 NOTE — ED Provider Notes (Signed)
 "  [facility] Provider Note    Event Date/Time   First MD Initiated Contact with Patient 12/14/24 1544     (approximate)  History   Chief Complaint: Altered Mental Status and UTI  HPI  Bruce Wyatt. is a 78 y.o. male with a past medical history of BPH, diabetes, gastric reflux, hypertension, hyperlipidemia, presents to the emergency department for generalized weakness and mild confusion.  According to the patient and wife approximately 3 weeks ago patient was diagnosed with urinary retention and urinary tract infection had a catheter placed and was admitted for antibiotics for approximately 3 days.  States since going home the patient had been doing well however approximately 2 days ago the catheter was accidentally pulled out and a new 1 was placed in the emergency department.  Patient states he has continued to notice good drainage from the catheter but over the last 2 days has become very weak and the wife states mild confusion at times.  She states this is identical to how he presented the first time with urinary tract infection.  No noted fever at home afebrile 98.1 in the emergency department.  Patient is awake alert able to give a good history.  No distress.  Physical Exam   Triage Vital Signs: ED Triage Vitals  Encounter Vitals Group     BP 12/14/24 1533 128/67     Girls Systolic BP Percentile --      Girls Diastolic BP Percentile --      Boys Systolic BP Percentile --      Boys Diastolic BP Percentile --      Pulse Rate 12/14/24 1533 92     Resp 12/14/24 1533 16     Temp 12/14/24 1533 98.1 F (36.7 C)     Temp Source 12/14/24 1533 Oral     SpO2 12/14/24 1533 95 %     Weight 12/14/24 1534 190 lb (86.2 kg)     Height 12/14/24 1534 6' 2 (1.88 m)     Head Circumference --      Peak Flow --      Pain Score 12/14/24 1533 3     Pain Loc --      Pain Education --      Exclude from Growth Chart --     Most recent vital signs: Vitals:    12/14/24 1533  BP: 128/67  Pulse: 92  Resp: 16  Temp: 98.1 F (36.7 C)  SpO2: 95%    General: Awake, no distress.  CV:  Good peripheral perfusion.  Regular rate and rhythm  Resp:  Normal effort.  Equal breath sounds bilaterally.  Abd:  No distention.  Soft, nontender.  No rebound or guarding. Other:  Catheter present with good output.   ED Results / Procedures / Treatments   EKG  EKG viewed and interpreted by myself shows normal sinus rhythm at 93 bpm with a narrow QRS, normal axis, normal intervals, no concerning ST changes.  RADIOLOGY  I have reviewed and interpreted the chest x-ray images.  No consolidation on my evaluation.  Radiology is read x-ray is negative.   MEDICATIONS ORDERED IN ED: Medications  sodium chloride  0.9 % bolus 1,000 mL (has no administration in time range)     IMPRESSION / MDM / ASSESSMENT AND PLAN / ED COURSE  I reviewed the triage vital signs and the nursing notes.  Patient's presentation is most consistent with acute presentation with potential threat to life or bodily function.  Patient presents to the emergency department for generalized weakness and mild confusion over the last 2 days.  2 days ago patient had his catheter replaced.  Wife states symptoms are similar to when he had a urinary tract infection.  Vital signs are reassuring.  Physical exam reassuring.  No fullness over the bladder.  Will do a bladder scan to ensure no retention.  We will check labs including a new urinalysis we will IV hydrate.  We will also obtain a COVID/flu test as a precaution given the weakness.  Patient not brought in 101.6.  Will dose Tylenol  we will check blood cultures and a lactic acid we will start broad-spectrum antibiotics given the patient's confusion and weakness while awaiting for the results.  Lab work has resulted showing a reassuring chemistry.  Reassuring CBC with a normal white blood cell count.  Lactic acid reassuring as well.  However patient has  significant urinary tract infection with greater than 50 red cells and white cells with many bacteria and white blood cell clumps.  Given the patient's confusion and weakness we will admit to the hospitalist service for further workup and treatment.   CRITICAL CARE Performed by: Franky Moores   Total critical care time: 30 minutes  Critical care time was exclusive of separately billable procedures and treating other patients.  Critical care was necessary to treat or prevent imminent or life-threatening deterioration.  Critical care was time spent personally by me on the following activities: development of treatment plan with patient and/or surrogate as well as nursing, discussions with consultants, evaluation of patient's response to treatment, examination of patient, obtaining history from patient or surrogate, ordering and performing treatments and interventions, ordering and review of laboratory studies, ordering and review of radiographic studies, pulse oximetry and re-evaluation of patient's condition.   FINAL CLINICAL IMPRESSION(S) / ED DIAGNOSES   Weakness Sepsis Urinary tract infection  Note:  This document was prepared using Dragon voice recognition software and may include unintentional dictation errors.   Moores Franky, MD 12/14/24 2249  "

## 2024-12-14 NOTE — Progress Notes (Signed)
 Elink following for sepsis protocol.

## 2024-12-14 NOTE — ED Notes (Signed)
 Called lab to have urine culture added to urine specimen sent. Was told it would be added.

## 2024-12-14 NOTE — Consult Note (Signed)
 CODE SEPSIS - PHARMACY COMMUNICATION  **Broad Spectrum Antibiotics should be administered within 1 hour of Sepsis diagnosis**  Time Code Sepsis Called/Page Received: 1602  Antibiotics Ordered: cefepime , vancomycin , flagyl   Time of 1st antibiotic administration: 1626  Additional action taken by pharmacy: n/a  If necessary, Name of Provider/Nurse Contacted: n/a    Miley Lindon A Kiley Torrence ,PharmD Clinical Pharmacist  12/14/2024  5:16 PM

## 2024-12-14 NOTE — H&P (Signed)
 " HISTORY AND PHYSICAL    Bruce Wyatt.   FMW:[id number] DOB: 06/06/1946   Date of Service: 12/14/2024 Requesting physician/APP from ED: Treatment Team:  Attending Provider: Marsa Edelman, DO  PCP: [name], MD     HPI: Bruce Wyatt. Is a 78 y.o. male presenting to the ED w/ chief complaint generalized weakness, falling at home w/ lightheadedness upon standing. 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   In ED, (+)febrile, briefly tachycardic, code sepsis called and broad spectrum antibiotics. (+)UA, Admitted to hospitalist service for complicated UTI     Consultants:  none  Procedures: none      ASSESSMENT & PLAN:   Principal Problem:   UTI (urinary tract infection)  Complicted UTI Borderline sepsis  Recent (+)KLebsiella urine culture Ceftriaxone  Await cultures Foley just exchanged, will d/w urology if needs changed again suspect yes Flomax    Syncope Orthostatic description Orthostatic VS Echo Carotid US  Telemetry x24h   Parkinsons Sinemet    GERD PPI   DM2 No lactic acidosis Continue GLP1 and metformin   KLD Statin  Insomnia chronic  Ambien       DVT prophylaxis: lovenox  Pertinent IV fluids/nutrition: carb diet  Foley  Code Status: FULL CODE Family Communication: none at this time  Disposition: inpatient TOC needs: TBD  Barriers to discharge / significant pending items: syncope workup, UCx results, urology consult in AM                   Review of Systems:  Review of Systems  Constitutional:  Positive for malaise/fatigue. Negative for chills and fever.  Respiratory:  Negative for shortness of breath.   Cardiovascular:  Negative for chest pain, palpitations, orthopnea, claudication and leg swelling.  Gastrointestinal:  Positive for abdominal pain. Negative for heartburn, nausea and vomiting.  Genitourinary:  Negative for flank pain.   Musculoskeletal:  Positive for falls.  Neurological:  Positive for dizziness and weakness. Negative for focal weakness, seizures and loss of consciousness.       has a past medical history of Abnormal colonoscopy, Alcoholism (HCC), BPH (benign prostatic hyperplasia), Diabetes mellitus, Diverticulosis, Gallstones, GERD (gastroesophageal reflux disease), H/O hematuria, Hemorrhoids, Hyperlipidemia, Hypertension, Iron deficiency, Sleep apnea, and Vitamin D  deficiency. Show/hide medication list[1] Scheduled Meds:  carbidopa -levodopa   2 tablet Oral TID   [START ON 12/15/2024] cyanocobalamin   1,000 mcg Oral Daily   enoxaparin  (LOVENOX ) injection  40 mg Subcutaneous QHS   [START ON 12/15/2024] metFORMIN   500 mg Oral Q breakfast   [START ON 12/15/2024] multivitamin with minerals  1 tablet Oral Daily   pantoprazole   40 mg Oral BID   rosuvastatin   40 mg Oral Daily   [START ON 12/15/2024] tamsulosin   0.4 mg Oral Daily   [START ON 12/15/2024] venlafaxine  XR  75 mg Oral Daily   zolpidem   10 mg Oral QHS   Continuous Infusions:  [START ON 12/15/2024] cefTRIAXone  (ROCEPHIN )  IV     PRN Meds:.  Allergies[2]   reports that he has never smoked. He has never used smokeless tobacco. He reports that he does not drink alcohol and does not use drugs.   family history includes Breast cancer in his mother; Colon cancer in his maternal grandfather; Coronary artery disease in his father; Diabetes in his paternal grandfather; Heart disease in his father and paternal grandfather; Nephrolithiasis in his daughter. Past Surgical History:  Procedure Laterality Date   CARDIOVASCULAR STRESS TEST  02/12/2009  Negative/AT [facility]   COLONOSCOPY     CYSTOSCOPY  07/03/2012   RHINOPLASTY     soft palate reduction for apnea   Soft palate reduction for Sleep Apnea     [facility]   TONSILLECTOMY     UPPER GASTROINTESTINAL ENDOSCOPY     VASECTOMY            Objective Findings:  Vitals:    12/14/24 1730 12/14/24 1749 12/14/24 1800 12/14/24 1851  BP: 123/61  (!) 120/58 113/60  Pulse: 64  61 (!) 58  Resp: 17  18   Temp:  98.8 F (37.1 C)  99.5 F (37.5 C)  TempSrc:  Oral  Oral  SpO2: 98%  98% 95%  Weight:    89.8 kg  Height:    6' 2 (1.88 m)   No intake or output data in the 24 hours ending 12/14/24 1903 Filed Weights   12/14/24 1534 12/14/24 1851  Weight: 86.2 kg 89.8 kg    Examination:  Physical Exam       Scheduled Medications:   carbidopa -levodopa   2 tablet Oral TID   [START ON 12/15/2024] cyanocobalamin   1,000 mcg Oral Daily   enoxaparin  (LOVENOX ) injection  40 mg Subcutaneous QHS   [START ON 12/15/2024] metFORMIN   500 mg Oral Q breakfast   [START ON 12/15/2024] multivitamin with minerals  1 tablet Oral Daily   pantoprazole   40 mg Oral BID   rosuvastatin   40 mg Oral Daily   [START ON 12/15/2024] tamsulosin   0.4 mg Oral Daily   [START ON 12/15/2024] venlafaxine  XR  75 mg Oral Daily   zolpidem   10 mg Oral QHS    Continuous Infusions:   PRN Medications:    Antimicrobials:  Anti-infectives (From admission, onward)    Start     Dose/Rate Route Frequency Ordered Stop   12/14/24 1615  ceFEPIme  (MAXIPIME ) 2 g in sodium chloride  0.9 % 100 mL IVPB        2 g 200 mL/hr over 30 Minutes Intravenous  Once 12/14/24 1602 12/14/24 1635   12/14/24 1615  metroNIDAZOLE  (FLAGYL ) IVPB 500 mg        500 mg 100 mL/hr over 60 Minutes Intravenous  Once 12/14/24 1602 12/14/24 1738   12/14/24 1615  vancomycin  (VANCOCIN ) IVPB 1000 mg/200 mL premix        1,000 mg 200 mL/hr over 60 Minutes Intravenous  Once 12/14/24 1602 12/14/24 1847           Data Reviewed: I have personally reviewed following labs and imaging studies  CBC: Recent Labs  Lab 12/14/24 1540  WBC 10.9*  HGB 12.7*  HCT 37.1*  MCV 86.3  PLT 149*   Basic Metabolic Panel: Recent Labs  Lab 12/14/24 1540  NA 137  K 3.9  CL 103  CO2 22  GLUCOSE 170*  BUN 15  CREATININE 0.83   CALCIUM  8.8*   GFR: Estimated Creatinine Clearance: 85.3 mL/min (by C-G formula based on SCr of 0.83 mg/dL). Liver Function Tests: Recent Labs  Lab 12/14/24 1540  AST 15  ALT 13  ALKPHOS 82  BILITOT 1.0  PROT 6.4*  ALBUMIN 3.9   No results for input(s): LIPASE, AMYLASE in the last 168 hours. No results for input(s): AMMONIA in the last 168 hours. Coagulation Profile: No results for input(s): INR, PROTIME in the last 168 hours. Cardiac Enzymes: No results for input(s): CKTOTAL, CKMB, CKMBINDEX, TROPONINI in the last 168 hours. BNP (last 3 results) No results  for input(s): PROBNP in the last 8760 hours. HbA1C: No results for input(s): HGBA1C in the last 72 hours. CBG: No results for input(s): GLUCAP in the last 168 hours. Lipid Profile: No results for input(s): CHOL, HDL, LDLCALC, TRIG, CHOLHDL, LDLDIRECT in the last 72 hours. Thyroid  Function Tests: No results for input(s): TSH, T4TOTAL, FREET4, T3FREE, THYROIDAB in the last 72 hours. Anemia Panel: No results for input(s): VITAMINB12, FOLATE, FERRITIN, TIBC, IRON, RETICCTPCT in the last 72 hours. Most Recent Urinalysis On File:     Component Value Date/Time   COLORURINE AMBER (A) 12/14/2024 1540   APPEARANCEUR CLOUDY (A) 12/14/2024 1540   APPEARANCEUR Slightly cloudy 12/04/2024 1508   LABSPEC 1.020 12/14/2024 1540   PHURINE 7.0 12/14/2024 1540   GLUCOSEU NEGATIVE 12/14/2024 1540   HGBUR SMALL (A) 12/14/2024 1540   BILIRUBINUR NEGATIVE 12/14/2024 1540   BILIRUBINUR Negative 12/04/2024 1508   KETONESUR NEGATIVE 12/14/2024 1540   PROTEINUR >=300 (A) 12/14/2024 1540   UROBILINOGEN 0.2 12/08/2023 1204   NITRITE NEGATIVE 12/14/2024 1540   LEUKOCYTESUR LARGE (A) 12/14/2024 1540   Sepsis Labs: @LABRCNTIP (procalcitonin:4,lacticidven:4)  Recent Results (from the past 240 hours)  Resp panel by RT-PCR (RSV, Flu A&B, Covid) Anterior Nasal Swab     Status: None    Collection Time: 12/14/24  4:01 PM   Specimen: Anterior Nasal Swab  Result Value Ref Range Status   SARS Coronavirus 2 by RT PCR NEGATIVE NEGATIVE Final    Comment: (NOTE) SARS-CoV-2 target nucleic acids are NOT DETECTED.  The SARS-CoV-2 RNA is generally detectable in upper respiratory specimens during the acute phase of infection. The lowest concentration of SARS-CoV-2 viral copies this assay can detect is 138 copies/mL. A negative result does not preclude SARS-Cov-2 infection and should not be used as the sole basis for treatment or other patient management decisions. A negative result may occur with  improper specimen collection/handling, submission of specimen other than nasopharyngeal swab, presence of viral mutation(s) within the areas targeted by this assay, and inadequate number of viral copies(<138 copies/mL). A negative result must be combined with clinical observations, patient history, and epidemiological information. The expected result is Negative.  Fact Sheet for Patients:  bloggercourse.com  Fact Sheet for Healthcare Providers:  seriousbroker.it  This test is no t yet approved or cleared by the [place]  FDA and  has been authorized for detection and/or diagnosis of SARS-CoV-2 by FDA under an Emergency Use Authorization (EUA). This EUA will remain  in effect (meaning this test can be used) for the duration of the COVID-19 declaration under Section 564(b)(1) of the Act, 21 U.S.C.section 360bbb-3(b)(1), unless the authorization is terminated  or revoked sooner.       Influenza A by PCR NEGATIVE NEGATIVE Final   Influenza B by PCR NEGATIVE NEGATIVE Final    Comment: (NOTE) The Xpert Xpress SARS-CoV-2/FLU/RSV plus assay is intended as an aid in the diagnosis of influenza from Nasopharyngeal swab specimens and should not be used as a sole basis for treatment. Nasal washings and aspirates are unacceptable for  Xpert Xpress SARS-CoV-2/FLU/RSV testing.  Fact Sheet for Patients: bloggercourse.com  Fact Sheet for Healthcare Providers: seriousbroker.it  This test is not yet approved or cleared by the [place]  FDA and has been authorized for detection and/or diagnosis of SARS-CoV-2 by FDA under an Emergency Use Authorization (EUA). This EUA will remain in effect (meaning this test can be used) for the duration of the COVID-19 declaration under Section 564(b)(1) of the Act, 21 U.S.C. section 360bbb-3(b)(1), unless the  authorization is terminated or revoked.     Resp Syncytial Virus by PCR NEGATIVE NEGATIVE Final    Comment: (NOTE) Fact Sheet for Patients: bloggercourse.com  Fact Sheet for Healthcare Providers: seriousbroker.it  This test is not yet approved or cleared by the [place]  FDA and has been authorized for detection and/or diagnosis of SARS-CoV-2 by FDA under an Emergency Use Authorization (EUA). This EUA will remain in effect (meaning this test can be used) for the duration of the COVID-19 declaration under Section 564(b)(1) of the Act, 21 U.S.C. section 360bbb-3(b)(1), unless the authorization is terminated or revoked.  Performed at [facility], [address], [place], [place] [address]          Radiology Studies: DG Chest [place] 1 View Result Date: 12/14/2024 CLINICAL DATA:  Questionable sepsis - evaluate for abnormality EXAM: PORTABLE CHEST - 1 VIEW COMPARISON:  12/10/2024 FINDINGS: Cardiomediastinal silhouette and pulmonary vasculature are within normal limits. Lungs are clear. IMPRESSION: No acute cardiopulmonary process. Electronically Signed   By: Aliene Lloyd M.D.   On: 12/14/2024 17:19             LOS: 0 days       Otis Burress, DO [facility] 12/14/2024, 7:03 PM    Dictation software may have been used to generate  the above note. Typos may occur and escape review in typed/dictated notes. Please contact Dr Marsa directly for clarity if needed.  Staff may message me via secure chat in Epic  but this may not receive an immediate response,  please page me for urgent matters!  If 7PM-7AM, please contact night coverage [email or web address]          [1] (Not in an outpatient encounter)  [2]  Allergies Allergen Reactions   Ace Inhibitors     COUGH   "

## 2024-12-14 NOTE — ED Triage Notes (Signed)
 First nurse note: Pt to ED via [facility] from home. Pt reports lower abd pain and at catheter site and increased weakness.   Pt here 2 days ago to have foley cath adjusted because it was backed up. Concerned for UTI.   172/63 HR 80 98.8 oral

## 2024-12-15 ENCOUNTER — Inpatient Hospital Stay
Admit: 2024-12-15 | Discharge: 2024-12-15 | Disposition: A | Discharge status: Home / Self Care | Attending: Osteopathic Medicine | Admitting: Osteopathic Medicine

## 2024-12-15 DIAGNOSIS — T83511A Infection and inflammatory reaction due to indwelling urethral catheter, initial encounter: Secondary | ICD-10-CM | POA: Diagnosis not present

## 2024-12-15 DIAGNOSIS — R55 Syncope and collapse: Secondary | ICD-10-CM | POA: Diagnosis not present

## 2024-12-15 DIAGNOSIS — N390 Urinary tract infection, site not specified: Secondary | ICD-10-CM | POA: Diagnosis not present

## 2024-12-15 LAB — ECHOCARDIOGRAM COMPLETE
AR max vel: 2.74 cm2
AV Peak grad: 9.7 mmHg
Ao pk vel: 1.56 m/s
Area-P 1/2: 4.15 cm2
Height: 74 in
S' Lateral: 3.4 cm
Weight: 3168 [oz_av]

## 2024-12-15 MED ORDER — CHLORHEXIDINE GLUCONATE CLOTH 2 % EX PADS
6.0000 | MEDICATED_PAD | Freq: Every day | CUTANEOUS | Status: DC
  Administered 2024-12-15 – 2024-12-16 (×2): 6 via TOPICAL

## 2024-12-15 NOTE — Plan of Care (Signed)
  Problem: Education: Goal: Knowledge of General Education information will improve Description: Including pain rating scale, medication(s)/side effects and non-pharmacologic comfort measures Outcome: Progressing   Problem: Clinical Measurements: Goal: Ability to maintain clinical measurements within normal limits will improve Outcome: Progressing Goal: Will remain free from infection Outcome: Progressing Goal: Diagnostic test results will improve Outcome: Progressing Goal: Respiratory complications will improve Outcome: Progressing Goal: Cardiovascular complication will be avoided Outcome: Progressing   Problem: Elimination: Goal: Will not experience complications related to bowel motility Outcome: Progressing Goal: Will not experience complications related to urinary retention Outcome: Progressing   

## 2024-12-15 NOTE — Progress Notes (Addendum)
 " PROGRESS NOTE    Bruce Wyatt.   FMW:[id number] DOB: 03/07/1946  DOA: 12/14/2024 Date of Service: 12/15/2024 which is hospital day 1  PCP: [name], MD    HPI: Bruce Wyatt. Is a 78 y.o. male presenting to the ED w/ chief complaint generalized weakness, falling at home w/ lightheadedness upon standing. 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    Dx urinary retention w/ hospitalization 09/2024, void trial in office 10/21/24 ok, then again was retaining and re-placed foley 10/25/24, out again 11/13, in again 11/14, stayed in, exchanged 12/17 urine culture at that time pan-sensitive klebsiella, he was scheduled for enucleation in Jan given abx to premedicate - unclear if he was ever actually treated for the klebsiella if that was most likely contaminant. He came to the ED 12/24 and foley was exchanged (per note -he accidentally tugged on his Foley and it came out a few inches but then he pushed it back in then bag has not been draining and he feels pressure in his lower abdomen). then 12/27 back to ED as above    In ED, (+)febrile, briefly tachycardic, code sepsis called and broad spectrum antibiotics. (+)UA, Admitted to hospitalist service for complicated UTI        Consultants:  none   Procedures: none           ASSESSMENT & PLAN:   Complicted UTI Associated with chronic Foley in place for urinary retention PRESENT ON ADMISSION Borderline sepsis  Recent (+)Klebsiella urine culture Ceftriaxone  Await cultures Foley just exchanged, will d/w urology if needs changed again --> urology onthis weekend states can leave in place  Flomax     Syncope Orthostatic description Orthostatic VS - needs done but pt feeling better now Echo - no concerns Carotid US  - no concerns Telemetry x24h then can let expire   Parkinsons Sinemet     GERD PPI    DM2 No lactic acidosis Continue GLP1 and metformin     KLD Statin   Insomnia chronic  Ambien           DVT prophylaxis: lovenox  Pertinent IV fluids/nutrition: carb diet  Foley   Code Status: FULL CODE Family Communication: none at this time   Disposition: inpatient TOC needs: TBD  Barriers to discharge / significant pending items: UCx results           Subjective / Brief ROS:  Patient reports feeling better today  Denies CP/SOB.  Pain controlled.  Denies new weakness.  Tolerating diet.  Reports no concerns w/ urination/defecation.   Family Communication: none at this time    Objective Findings:  Vitals:   12/15/24 0309 12/15/24 0813 12/15/24 1130 12/15/24 1633  BP: 120/65 124/70 137/71 (!) 141/79  Pulse: (!) 58 61 63 64  Resp:  16 18 18   Temp: 97.8 F (36.6 C) 98.3 F (36.8 C) 98 F (36.7 C) 99.2 F (37.3 C)  TempSrc: Oral Oral Oral Oral  SpO2: 97% 95% 96% 94%  Weight:      Height:        Intake/Output Summary (Last 24 hours) at 12/15/2024 1659 Last data filed at 12/15/2024 0259 Gross per 24 hour  Intake --  Output 1900 ml  Net -1900 ml   Filed Weights   12/14/24 1534 12/14/24 1851  Weight: 86.2 kg 89.8 kg    Examination:  Physical Exam Constitutional:      General: He is not  in acute distress.    Appearance: He is not ill-appearing.  Cardiovascular:     Rate and Rhythm: Normal rate and regular rhythm.  Pulmonary:     Effort: Pulmonary effort is normal.     Breath sounds: Normal breath sounds.  Neurological:     Mental Status: He is alert and oriented to person, place, and time. Mental status is at baseline.  Psychiatric:        Mood and Affect: Mood normal.        Behavior: Behavior normal.          Scheduled Medications:   carbidopa -levodopa   2 tablet Oral TID   Chlorhexidine  Gluconate Cloth  6 each Topical Daily   cyanocobalamin   1,000 mcg Oral Daily   enoxaparin  (LOVENOX ) injection  40 mg Subcutaneous QHS   metFORMIN   500 mg Oral Q breakfast   multivitamin with  minerals  1 tablet Oral Daily   pantoprazole   40 mg Oral BID   rosuvastatin   40 mg Oral Daily   tamsulosin   0.4 mg Oral Daily   venlafaxine  XR  75 mg Oral Daily   zolpidem   10 mg Oral QHS    Continuous Infusions:  cefTRIAXone  (ROCEPHIN )  IV 1 g (12/15/24 0118)    PRN Medications:    Antimicrobials from admission:  Anti-infectives (From admission, onward)    Start     Dose/Rate Route Frequency Ordered Stop   12/15/24 0000  cefTRIAXone  (ROCEPHIN ) 1 g in sodium chloride  0.9 % 100 mL IVPB        1 g 200 mL/hr over 30 Minutes Intravenous Every 24 hours 12/14/24 1906     12/14/24 1615  ceFEPIme  (MAXIPIME ) 2 g in sodium chloride  0.9 % 100 mL IVPB        2 g 200 mL/hr over 30 Minutes Intravenous  Once 12/14/24 1602 12/14/24 1635   12/14/24 1615  metroNIDAZOLE  (FLAGYL ) IVPB 500 mg        500 mg 100 mL/hr over 60 Minutes Intravenous  Once 12/14/24 1602 12/14/24 1738   12/14/24 1615  vancomycin  (VANCOCIN ) IVPB 1000 mg/200 mL premix        1,000 mg 200 mL/hr over 60 Minutes Intravenous  Once 12/14/24 1602 12/15/24 0120           Data Reviewed:  I have personally reviewed the following...  CBC: Recent Labs  Lab 12/14/24 1540 12/14/24 2117  WBC 10.9* 9.1  HGB 12.7* 11.2*  HCT 37.1* 33.9*  MCV 86.3 88.5  PLT 149* 127*   Basic Metabolic Panel: Recent Labs  Lab 12/14/24 1540 12/14/24 2117  NA 137  --   K 3.9  --   CL 103  --   CO2 22  --   GLUCOSE 170*  --   BUN 15  --   CREATININE 0.83 0.78  CALCIUM  8.8*  --    GFR: Estimated Creatinine Clearance: 88.5 mL/min (by C-G formula based on SCr of 0.78 mg/dL). Liver Function Tests: Recent Labs  Lab 12/14/24 1540  AST 15  ALT 13  ALKPHOS 82  BILITOT 1.0  PROT 6.4*  ALBUMIN 3.9   No results for input(s): LIPASE, AMYLASE in the last 168 hours. No results for input(s): AMMONIA in the last 168 hours. Coagulation Profile: No results for input(s): INR, PROTIME in the last 168 hours. Cardiac Enzymes: No  results for input(s): CKTOTAL, CKMB, CKMBINDEX, TROPONINI in the last 168 hours. BNP (last 3 results) No results for input(s): PROBNP in the last  8760 hours. HbA1C: No results for input(s): HGBA1C in the last 72 hours. CBG: No results for input(s): GLUCAP in the last 168 hours. Lipid Profile: No results for input(s): CHOL, HDL, LDLCALC, TRIG, CHOLHDL, LDLDIRECT in the last 72 hours. Thyroid  Function Tests: No results for input(s): TSH, T4TOTAL, FREET4, T3FREE, THYROIDAB in the last 72 hours. Anemia Panel: No results for input(s): VITAMINB12, FOLATE, FERRITIN, TIBC, IRON, RETICCTPCT in the last 72 hours. Most Recent Urinalysis On File:     Component Value Date/Time   COLORURINE AMBER (A) 12/14/2024 1540   APPEARANCEUR CLOUDY (A) 12/14/2024 1540   APPEARANCEUR Slightly cloudy 12/04/2024 1508   LABSPEC 1.020 12/14/2024 1540   PHURINE 7.0 12/14/2024 1540   GLUCOSEU NEGATIVE 12/14/2024 1540   HGBUR SMALL (A) 12/14/2024 1540   BILIRUBINUR NEGATIVE 12/14/2024 1540   BILIRUBINUR Negative 12/04/2024 1508   KETONESUR NEGATIVE 12/14/2024 1540   PROTEINUR >=300 (A) 12/14/2024 1540   UROBILINOGEN 0.2 12/08/2023 1204   NITRITE NEGATIVE 12/14/2024 1540   LEUKOCYTESUR LARGE (A) 12/14/2024 1540   Sepsis Labs: @LABRCNTIP (procalcitonin:4,lacticidven:4) Microbiology: Recent Results (from the past 240 hours)  Resp panel by RT-PCR (RSV, Flu A&B, Covid) Anterior Nasal Swab     Status: None   Collection Time: 12/14/24  4:01 PM   Specimen: Anterior Nasal Swab  Result Value Ref Range Status   SARS Coronavirus 2 by RT PCR NEGATIVE NEGATIVE Final    Comment: (NOTE) SARS-CoV-2 target nucleic acids are NOT DETECTED.  The SARS-CoV-2 RNA is generally detectable in upper respiratory specimens during the acute phase of infection. The lowest concentration of SARS-CoV-2 viral copies this assay can detect is 138 copies/mL. A negative result does not preclude  SARS-Cov-2 infection and should not be used as the sole basis for treatment or other patient management decisions. A negative result may occur with  improper specimen collection/handling, submission of specimen other than nasopharyngeal swab, presence of viral mutation(s) within the areas targeted by this assay, and inadequate number of viral copies(<138 copies/mL). A negative result must be combined with clinical observations, patient history, and epidemiological information. The expected result is Negative.  Fact Sheet for Patients:  bloggercourse.com  Fact Sheet for Healthcare Providers:  seriousbroker.it  This test is no t yet approved or cleared by the [place]  FDA and  has been authorized for detection and/or diagnosis of SARS-CoV-2 by FDA under an Emergency Use Authorization (EUA). This EUA will remain  in effect (meaning this test can be used) for the duration of the COVID-19 declaration under Section 564(b)(1) of the Act, 21 U.S.C.section 360bbb-3(b)(1), unless the authorization is terminated  or revoked sooner.       Influenza A by PCR NEGATIVE NEGATIVE Final   Influenza B by PCR NEGATIVE NEGATIVE Final    Comment: (NOTE) The Xpert Xpress SARS-CoV-2/FLU/RSV plus assay is intended as an aid in the diagnosis of influenza from Nasopharyngeal swab specimens and should not be used as a sole basis for treatment. Nasal washings and aspirates are unacceptable for Xpert Xpress SARS-CoV-2/FLU/RSV testing.  Fact Sheet for Patients: bloggercourse.com  Fact Sheet for Healthcare Providers: seriousbroker.it  This test is not yet approved or cleared by the [place]  FDA and has been authorized for detection and/or diagnosis of SARS-CoV-2 by FDA under an Emergency Use Authorization (EUA). This EUA will remain in effect (meaning this test can be used) for the duration of  the COVID-19 declaration under Section 564(b)(1) of the Act, 21 U.S.C. section 360bbb-3(b)(1), unless the authorization is terminated or revoked.  Resp Syncytial Virus by PCR NEGATIVE NEGATIVE Final    Comment: (NOTE) Fact Sheet for Patients: bloggercourse.com  Fact Sheet for Healthcare Providers: seriousbroker.it  This test is not yet approved or cleared by the [place]  FDA and has been authorized for detection and/or diagnosis of SARS-CoV-2 by FDA under an Emergency Use Authorization (EUA). This EUA will remain in effect (meaning this test can be used) for the duration of the COVID-19 declaration under Section 564(b)(1) of the Act, 21 U.S.C. section 360bbb-3(b)(1), unless the authorization is terminated or revoked.  Performed at [facility], [address], [place], [place] [address]   Blood Culture (routine x 2)     Status: None (Preliminary result)   Collection Time: 12/14/24  4:22 PM   Specimen: BLOOD  Result Value Ref Range Status   Specimen Description BLOOD BLOOD LEFT FOREARM  Final   Special Requests   Final    BOTTLES DRAWN AEROBIC AND ANAEROBIC Blood Culture results may not be optimal due to an inadequate volume of blood received in culture bottles   Culture   Final    NO GROWTH < 24 HOURS Performed at [facility], [address] [place], [place] [address]    Report Status PENDING  Incomplete  Blood Culture (routine x 2)     Status: None (Preliminary result)   Collection Time: 12/14/24  4:22 PM   Specimen: BLOOD  Result Value Ref Range Status   Specimen Description BLOOD RIGHT ANTECUBITAL  Final   Special Requests   Final    BOTTLES DRAWN AEROBIC AND ANAEROBIC Blood Culture results may not be optimal due to an inadequate volume of blood received in culture bottles   Culture   Final    NO GROWTH < 24 HOURS Performed at [facility], [address], [place], [place]  [address]    Report Status PENDING  Incomplete      Radiology Studies last 3 days: ECHOCARDIOGRAM COMPLETE Result Date: 12/15/2024    ECHOCARDIOGRAM REPORT   Patient Name:   Kaydin Karbowski. Date of Exam: 12/15/2024 Medical Rec #:  [id number]              Height:       74.0 in Accession #:    [id number]             Weight:       198.0 lb Date of Birth:  November 26, 1946               BSA:          2.164 m Patient Age:    78 years               BP:           131/67 mmHg Patient Gender: M                      HR:           69 bpm. Exam Location:  [facility] Procedure: 2D Echo, 3D Echo, Color Doppler, Cardiac Doppler and Strain Analysis            (Both Spectral and Color Flow Doppler were utilized during            procedure). Indications:     Syncope  History:         Patient has prior history of Echocardiogram examinations, most  recent 01/05/2010. Risk Factors:Hypertension, Diabetes,                  Dyslipidemia and Sleep Apnea.  Sonographer:     Logan Shove RDCS Referring Phys:  [id number] LANETA BLUNT Diagnosing Phys: Redell Cave MD IMPRESSIONS  1. Left ventricular ejection fraction, by estimation, is 60 to 65%. The left ventricle has normal function. The left ventricle has no regional wall motion abnormalities. Left ventricular diastolic parameters are consistent with Grade II diastolic dysfunction (pseudonormalization). The average left ventricular global longitudinal strain is -19.1 %. The global longitudinal strain is normal.  2. Right ventricular systolic function is normal. The right ventricular size is mildly enlarged.  3. Left atrial size was mild to moderately dilated.  4. Right atrial size was moderately dilated.  5. The mitral valve is normal in structure. Mild mitral valve regurgitation.  6. The aortic valve is tricuspid. Aortic valve regurgitation is mild. Aortic valve sclerosis is present, with no evidence of aortic valve stenosis. FINDINGS  Left Ventricle: Left ventricular  ejection fraction, by estimation, is 60 to 65%. The left ventricle has normal function. The left ventricle has no regional wall motion abnormalities. The average left ventricular global longitudinal strain is -19.1 %. Strain was performed and the global longitudinal strain is normal. The left ventricular internal cavity size was normal in size. There is no left ventricular hypertrophy. Left ventricular diastolic parameters are consistent with Grade II diastolic dysfunction (pseudonormalization). Right Ventricle: The right ventricular size is mildly enlarged. No increase in right ventricular wall thickness. Right ventricular systolic function is normal. Left Atrium: Left atrial size was mild to moderately dilated. Right Atrium: Right atrial size was moderately dilated. Pericardium: There is no evidence of pericardial effusion. Mitral Valve: The mitral valve is normal in structure. Mild mitral valve regurgitation. Tricuspid Valve: The tricuspid valve is normal in structure. Tricuspid valve regurgitation is mild. Aortic Valve: The aortic valve is tricuspid. Aortic valve regurgitation is mild. Aortic valve sclerosis is present, with no evidence of aortic valve stenosis. Aortic valve peak gradient measures 9.7 mmHg. Pulmonic Valve: The pulmonic valve was not well visualized. Pulmonic valve regurgitation is not visualized. Aorta: The aortic root and ascending aorta are structurally normal, with no evidence of dilitation. IAS/Shunts: No atrial level shunt detected by color flow Doppler.  LEFT VENTRICLE PLAX 2D LVIDd:         5.90 cm   Diastology LVIDs:         3.40 cm   LV e' medial:    5.11 cm/s LV PW:         0.90 cm   LV E/e' medial:  16.3 LV IVS:        0.90 cm   LV e' lateral:   6.31 cm/s LVOT diam:     2.00 cm   LV E/e' lateral: 13.2 LVOT Area:     3.14 cm LV IVRT:       148 msec  2D Longitudinal Strain                          2D Strain GLS Avg:     -19.1 %                           3D Volume EF:  3D EF:        66 %                          LV EDV:       195 ml                          LV ESV:       66 ml                          LV SV:        128 ml RIGHT VENTRICLE             IVC RV Basal diam:  5.20 cm     IVC diam: 2.40 cm RV Mid diam:    3.20 cm RV S prime:     17.90 cm/s TAPSE (M-mode): 3.1 cm LEFT ATRIUM              Index        RIGHT ATRIUM           Index LA diam:        4.50 cm  2.08 cm/m   RA Area:     37.10 cm LA Vol (A2C):   107.0 ml 49.44 ml/m  RA Volume:   153.00 ml 70.70 ml/m LA Vol (A4C):   92.8 ml  42.88 ml/m LA Biplane Vol: 103.0 ml 47.59 ml/m  AORTIC VALVE AV Area (Vmax): 2.74 cm AV Vmax:        156.00 cm/s AV Peak Grad:   9.7 mmHg LVOT Vmax:      136.00 cm/s  AORTA Ao Root diam: 3.20 cm Ao Asc diam:  3.70 cm MITRAL VALVE               TRICUSPID VALVE MV Area (PHT): 4.15 cm    TR Peak grad:   20.4 mmHg MV Decel Time: 183 msec    TR Vmax:        226.00 cm/s MV E velocity: 83.20 cm/s MV A velocity: 80.50 cm/s  SHUNTS MV E/A ratio:  1.03        Systemic Diam: 2.00 cm Redell Cave MD Electronically signed by Redell Cave MD Signature Date/Time: 12/15/2024/1:14:52 PM    Final    US  Carotid Bilateral Result Date: 12/15/2024 CLINICAL DATA:  106001 Syncope 106001 EXAM: BILATERAL CAROTID DUPLEX ULTRASOUND TECHNIQUE: Elnor scale imaging, color Doppler and duplex ultrasound were performed of bilateral carotid and vertebral arteries in the neck. COMPARISON:  None Available. FINDINGS: Criteria: Quantification of carotid stenosis is based on velocity parameters that correlate the residual internal carotid diameter with NASCET-based stenosis levels, using the diameter of the distal internal carotid lumen as the denominator for stenosis measurement. The following velocity measurements were obtained: RIGHT ICA: 92/30 cm/sec CCA: 97/12 cm/sec SYSTOLIC ICA/CCA RATIO:  0.9 ECA: 117 cm/sec LEFT ICA: 88/19 cm/sec CCA: 83/10 cm/sec SYSTOLIC ICA/CCA RATIO:  1.1 ECA: 145 cm/sec RIGHT  CAROTID ARTERY: Minor echogenic shadowing plaque formation. No hemodynamically significant right ICA stenosis, velocity elevation, or turbulent flow. Degree of narrowing less than 50%. RIGHT VERTEBRAL ARTERY:  Normal antegrade flow LEFT CAROTID ARTERY: Similar scattered minor echogenic plaque formation. No hemodynamically significant left ICA stenosis, velocity elevation, or turbulent flow. LEFT VERTEBRAL ARTERY:  Limited visualization but antegrade flow IMPRESSION: 1. Minor carotid atherosclerosis. Negative for significant stenosis. Degree of narrowing less than  50% bilaterally by ultrasound criteria. 2. Patent antegrade vertebral flow bilaterally. Electronically Signed   By: CHRISTELLA.  Shick M.D.   On: 12/15/2024 09:56   DG Chest Port 1 View Result Date: 12/14/2024 CLINICAL DATA:  Questionable sepsis - evaluate for abnormality EXAM: PORTABLE CHEST - 1 VIEW COMPARISON:  12/10/2024 FINDINGS: Cardiomediastinal silhouette and pulmonary vasculature are within normal limits. Lungs are clear. IMPRESSION: No acute cardiopulmonary process. Electronically Signed   By: Aliene Lloyd M.D.   On: 12/14/2024 17:19         Monae Topping, DO [facility] 12/15/2024, 4:59 PM    Dictation software may have been used to generate the above note. Typos may occur and escape review in typed/dictated notes. Please contact Dr Marsa directly for clarity if needed.  Staff may message me via secure chat in Epic  but this may not receive an immediate response,  please page me for urgent matters!  If 7PM-7AM, please contact night coverage [email or web address]       "

## 2024-12-15 NOTE — Progress Notes (Signed)
 Echocardiogram 2D Echocardiogram has been performed.  Kerim Statzer N Aaralyn Kil,RDCS 12/15/2024, 10:53 AM

## 2024-12-15 NOTE — Hospital Course (Addendum)
 HPI: Bruce Wyatt. Is a 78 y.o. male presenting to the ED w/ chief complaint generalized weakness, falling at home w/ lightheadedness upon standing. 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    Dx urinary retention w/ hospitalization 09/2024, void trial in office 10/21/24 ok, then again was retaining and re-placed foley 10/25/24, out again 11/13, in again 11/14, stayed in, exchanged 12/17 urine culture at that time pan-sensitive klebsiella, he was scheduled for enucleation in Jan given abx to premedicate - unclear if he was ever actually treated for the klebsiella if that was most likely contaminant. He came to the ED 12/24 and foley was exchanged (per note -he accidentally tugged on his Foley and it came out a few inches but then he pushed it back in then bag has not been draining and he feels pressure in his lower abdomen). then 12/27 back to ED as above    In ED, (+)febrile, briefly tachycardic, code sepsis called and broad spectrum antibiotics. (+)UA, Admitted to hospitalist service for complicated UTI        Consultants:  none   Procedures: none           ASSESSMENT & PLAN:   Complicted UTI Associated with chronic Foley in place for urinary retention PRESENT ON ADMISSION Borderline sepsis  Recent (+)Klebsiella urine culture Ceftriaxone  Await cultures Foley just exchanged, will d/w urology if needs changed again --> urology onthis weekend states can leave in place  Flomax     Syncope Orthostatic description Orthostatic VS - needs done but pt feeling better now Echo - no concerns Carotid US  - no concerns Telemetry x24h then can let expire   Parkinsons Sinemet     GERD PPI    DM2 No lactic acidosis Continue GLP1 and metformin    KLD Statin   Insomnia chronic  Ambien           DVT prophylaxis: lovenox  Pertinent IV fluids/nutrition: carb diet  Foley   Code Status: FULL CODE Family Communication:  none at this time   Disposition: inpatient TOC needs: TBD  Barriers to discharge / significant pending items: UCx results

## 2024-12-15 NOTE — Progress Notes (Signed)
 Mobility Specialist - Progress Note     12/15/24 0859  Mobility  Activity Stood at bedside;Ambulated independently  Level of Assistance Independent after set-up  Assistive Device None  Distance Ambulated (ft) 20 ft  Range of Motion/Exercises Active  Activity Response Tolerated well  Mobility Referral Yes  Mobility visit 1 Mobility  Mobility Specialist Start Time (ACUTE ONLY) 0845  Mobility Specialist Stop Time (ACUTE ONLY) 0859  Mobility Specialist Time Calculation (min) (ACUTE ONLY) 14 min   Pt resting EOB upon entry on RA. Pt ambulates to bathroom indep after set up and assistance donning socks at bedside. Pt returned to bed and left with needs in reach no AD utilized.   Guido Rumble Mobility Specialist 12/15/2024, 9:01 AM

## 2024-12-16 ENCOUNTER — Ambulatory Visit: Admitting: Cardiovascular Disease

## 2024-12-16 ENCOUNTER — Other Ambulatory Visit: Payer: Self-pay

## 2024-12-16 DIAGNOSIS — N390 Urinary tract infection, site not specified: Secondary | ICD-10-CM | POA: Diagnosis not present

## 2024-12-16 DIAGNOSIS — T83511A Infection and inflammatory reaction due to indwelling urethral catheter, initial encounter: Secondary | ICD-10-CM | POA: Diagnosis not present

## 2024-12-16 LAB — URINE CULTURE: Culture: >=100000 — AB

## 2024-12-16 MED ORDER — CIPROFLOXACIN HCL 500 MG PO TABS
500.0000 mg | ORAL_TABLET | Freq: Two times a day (BID) | ORAL | 0 refills | Status: AC
  Filled 2024-12-16: qty 14, 7d supply, fill #0

## 2024-12-16 MED ORDER — SENNOSIDES-DOCUSATE SODIUM 8.6-50 MG PO TABS
2.0000 | ORAL_TABLET | Freq: Once | ORAL | Status: AC
  Administered 2024-12-16: 2 via ORAL
  Filled 2024-12-16: qty 2

## 2024-12-16 MED ORDER — POLYETHYLENE GLYCOL 3350 17 G PO PACK
17.0000 g | PACK | Freq: Every day | ORAL | Status: DC
  Administered 2024-12-16: 17 g via ORAL
  Filled 2024-12-16: qty 1

## 2024-12-16 NOTE — Discharge Summary (Signed)
 "    Physician Discharge Summary   Patient: Bruce Wyatt. MRN: [id number]  DOB: 1946-03-30   Admit:     Date of Admission: 12/14/2024 Admitted from: home   Discharge: Date of discharge: 12/16/2024 Disposition: Home Condition at discharge: good  CODE STATUS: FULL CODE     Discharge Physician: Laneta Blunt, DO [facility]     PCP: [name], MD  Recommendations for Outpatient Follow-up:  Follow up with urology as directed    Discharge Instructions     Increase activity slowly   Complete by: As directed          Discharge Diagnoses: Principal Problem:   UTI (urinary tract infection)      HPI: Bruce Wyatt. Is a 78 y.o. male presenting to the ED w/ chief complaint generalized weakness, falling at home w/ lightheadedness upon standing. 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    Dx urinary retention w/ hospitalization 09/2024, void trial in office 10/21/24 ok, then again was retaining and re-placed foley 10/25/24, out again 11/13, in again 11/14, stayed in, exchanged 12/17 urine culture at that time pan-sensitive klebsiella, he was scheduled for enucleation in Jan given abx to premedicate - unclear if he was ever actually treated for the klebsiella if that was most likely contaminant. He came to the ED 12/24 and foley was exchanged (per note -he accidentally tugged on his Foley and it came out a few inches but then he pushed it back in then bag has not been draining and he feels pressure in his lower abdomen). then 12/27 back to ED as above    In ED, (+)febrile, briefly tachycardic, code sepsis called and broad spectrum antibiotics. (+)UA, Admitted to hospitalist service for complicated UTI   Await cultures which came back 12/16/2024 w/ klebsiella. Dc on cipro  for complicated UTI and follow w/ urology asap       Consultants:  none   Procedures: none            ASSESSMENT & PLAN:   Complicted UTI Associated with chronic Foley in place for urinary retention PRESENT ON ADMISSION Borderline sepsis  Recent (+)Klebsiella urine culture and same now  Cipro  to finish course  Foley just exchanged, will d/w urology if needs changed again --> urology onthis weekend states can leave in place  Flomax   I sent message to Dr Francisca to alert him pt was in the hospital    Syncope Orthostatic description Orthostatic VS - needs done but pt feeling better now Echo - no concerns Carotid US  - no concerns Telemetry x24h then can let expire   Parkinsons Sinemet     GERD PPI    DM2 No lactic acidosis Continue GLP1 and metformin    KLD Statin   Insomnia chronic  Ambien               Discharge Instructions  Allergies as of 12/16/2024       Reactions   Ace Inhibitors    COUGH        Medication List     TAKE these medications    carbidopa -levodopa  25-100 MG tablet Commonly known as: SINEMET  IR TAKE 2 TABLETS BY MOUTH THREE TIMES DAILY The timing of this medication is very important.   ciprofloxacin  500 MG tablet Commonly known as: Cipro  Take 1 tablet (500 mg total) by mouth 2 (two) times daily for 7 days.   cyanocobalamin  1000 MCG  tablet Commonly known as: VITAMIN B12 Take 1,000 mcg by mouth daily.   metFORMIN  500 MG tablet Commonly known as: GLUCOPHAGE  TAKE ONE (1) TABLET BY MOUTH TWO TIMES PER DAY   multivitamin with minerals tablet Take 1 tablet by mouth daily.   pantoprazole  40 MG tablet Commonly known as: PROTONIX  TAKE 1 TABLET BY MOUTH TWICE DAILY   rosuvastatin  40 MG tablet Commonly known as: CRESTOR  TAKE 1 TABLET BY MOUTH DAILY   Rybelsus  14 MG Tabs Generic drug: Semaglutide  Take 1 tablet (14 mg total) by mouth daily.   silodosin  8 MG Caps capsule Commonly known as: RAPAFLO  TAKE 1 CAPSULE BY MOUTH ONCE DAILY WITH BREAKFAST   venlafaxine  XR 75 MG 24 hr capsule Commonly known as: EFFEXOR -XR TAKE 1  CAPSULE BY MOUTH ONCE DAILY   zolpidem  10 MG tablet Commonly known as: AMBIEN  Take 1 tablet (10 mg total) by mouth at bedtime.         Follow-up Information     [name], MD Follow up.   Specialty: Internal Medicine Why: hospital follow up Contact information: [address] [place] [address] [phone]                 Allergies[1]   Subjective: Patient feeling well this morning, no complaints, feeling like he has a lot more energy today, eager for discharge home   Discharge Exam: BP 119/72 (BP Location: Left Arm)   Pulse 64   Temp 98.7 F (37.1 C) (Oral)   Resp 18   Ht 6' 2 (1.88 m)   Wt 89.8 kg   SpO2 96%   BMI 25.42 kg/m  General: Pt is alert, awake, not in acute distress Cardiovascular: RRR, S1/S2 +, no rubs, no gallops Respiratory: CTA bilaterally, no wheezing, no rhonchi Abdominal: Soft, NT, ND, bowel sounds + Extremities: no edema, no cyanosis     The results of significant diagnostics from this hospitalization (including imaging, microbiology, ancillary and laboratory) are listed below for reference.     Microbiology: Recent Results (from the past 240 hours)  Urine Culture     Status: Abnormal   Collection Time: 12/14/24  3:40 PM   Specimen: Urine, Clean Catch  Result Value Ref Range Status   Specimen Description   Final    URINE, CLEAN CATCH Performed at [facility], [address], [place], [place] [address]    Special Requests   Final    NONE Performed at [facility], [address] [place], [place] [address]    Culture >=100,000 COLONIES/mL KLEBSIELLA PNEUMONIAE (A)  Final   Report Status 12/16/2024 FINAL  Final   Organism ID, Bacteria KLEBSIELLA PNEUMONIAE (A)  Final      Susceptibility   Klebsiella pneumoniae - MIC*    AMPICILLIN RESISTANT Resistant     CEFAZOLIN (URINE) Value in next row Sensitive      2 SENSITIVEThis is a modified FDA-approved test that has been validated and its  performance characteristics determined by the reporting laboratory.  This laboratory is certified under the Clinical Laboratory Improvement Amendments CLIA as qualified to perform high complexity clinical laboratory testing.    CEFEPIME  Value in next row Sensitive      2 SENSITIVEThis is a modified FDA-approved test that has been validated and its performance characteristics determined by the reporting laboratory.  This laboratory is certified under the Clinical Laboratory Improvement Amendments CLIA as qualified to perform high complexity clinical laboratory testing.    ERTAPENEM Value in next row Sensitive  2 SENSITIVEThis is a modified FDA-approved test that has been validated and its performance characteristics determined by the reporting laboratory.  This laboratory is certified under the Clinical Laboratory Improvement Amendments CLIA as qualified to perform high complexity clinical laboratory testing.    CEFTRIAXONE  Value in next row Sensitive      2 SENSITIVEThis is a modified FDA-approved test that has been validated and its performance characteristics determined by the reporting laboratory.  This laboratory is certified under the Clinical Laboratory Improvement Amendments CLIA as qualified to perform high complexity clinical laboratory testing.    CIPROFLOXACIN  Value in next row Sensitive      2 SENSITIVEThis is a modified FDA-approved test that has been validated and its performance characteristics determined by the reporting laboratory.  This laboratory is certified under the Clinical Laboratory Improvement Amendments CLIA as qualified to perform high complexity clinical laboratory testing.    GENTAMICIN Value in next row Sensitive      2 SENSITIVEThis is a modified FDA-approved test that has been validated and its performance characteristics determined by the reporting laboratory.  This laboratory is certified under the Clinical Laboratory Improvement Amendments CLIA as qualified to  perform high complexity clinical laboratory testing.    NITROFURANTOIN  Value in next row Sensitive      2 SENSITIVEThis is a modified FDA-approved test that has been validated and its performance characteristics determined by the reporting laboratory.  This laboratory is certified under the Clinical Laboratory Improvement Amendments CLIA as qualified to perform high complexity clinical laboratory testing.    TRIMETH /SULFA  Value in next row Sensitive      2 SENSITIVEThis is a modified FDA-approved test that has been validated and its performance characteristics determined by the reporting laboratory.  This laboratory is certified under the Clinical Laboratory Improvement Amendments CLIA as qualified to perform high complexity clinical laboratory testing.    AMPICILLIN/SULBACTAM Value in next row Sensitive      2 SENSITIVEThis is a modified FDA-approved test that has been validated and its performance characteristics determined by the reporting laboratory.  This laboratory is certified under the Clinical Laboratory Improvement Amendments CLIA as qualified to perform high complexity clinical laboratory testing.    PIP/TAZO Value in next row Sensitive      <=4 SENSITIVEThis is a modified FDA-approved test that has been validated and its performance characteristics determined by the reporting laboratory.  This laboratory is certified under the Clinical Laboratory Improvement Amendments CLIA as qualified to perform high complexity clinical laboratory testing.    MEROPENEM Value in next row Sensitive      <=4 SENSITIVEThis is a modified FDA-approved test that has been validated and its performance characteristics determined by the reporting laboratory.  This laboratory is certified under the Clinical Laboratory Improvement Amendments CLIA as qualified to perform high complexity clinical laboratory testing.    * >=100,000 COLONIES/mL KLEBSIELLA PNEUMONIAE  Resp panel by RT-PCR (RSV, Flu A&B, Covid) Anterior  Nasal Swab     Status: None   Collection Time: 12/14/24  4:01 PM   Specimen: Anterior Nasal Swab  Result Value Ref Range Status   SARS Coronavirus 2 by RT PCR NEGATIVE NEGATIVE Final    Comment: (NOTE) SARS-CoV-2 target nucleic acids are NOT DETECTED.  The SARS-CoV-2 RNA is generally detectable in upper respiratory specimens during the acute phase of infection. The lowest concentration of SARS-CoV-2 viral copies this assay can detect is 138 copies/mL. A negative result does not preclude SARS-Cov-2 infection and should not be used as the sole basis  for treatment or other patient management decisions. A negative result may occur with  improper specimen collection/handling, submission of specimen other than nasopharyngeal swab, presence of viral mutation(s) within the areas targeted by this assay, and inadequate number of viral copies(<138 copies/mL). A negative result must be combined with clinical observations, patient history, and epidemiological information. The expected result is Negative.  Fact Sheet for Patients:  bloggercourse.com  Fact Sheet for Healthcare Providers:  seriousbroker.it  This test is no t yet approved or cleared by the [place]  FDA and  has been authorized for detection and/or diagnosis of SARS-CoV-2 by FDA under an Emergency Use Authorization (EUA). This EUA will remain  in effect (meaning this test can be used) for the duration of the COVID-19 declaration under Section 564(b)(1) of the Act, 21 U.S.C.section 360bbb-3(b)(1), unless the authorization is terminated  or revoked sooner.       Influenza A by PCR NEGATIVE NEGATIVE Final   Influenza B by PCR NEGATIVE NEGATIVE Final    Comment: (NOTE) The Xpert Xpress SARS-CoV-2/FLU/RSV plus assay is intended as an aid in the diagnosis of influenza from Nasopharyngeal swab specimens and should not be used as a sole basis for treatment. Nasal washings  and aspirates are unacceptable for Xpert Xpress SARS-CoV-2/FLU/RSV testing.  Fact Sheet for Patients: bloggercourse.com  Fact Sheet for Healthcare Providers: seriousbroker.it  This test is not yet approved or cleared by the [place]  FDA and has been authorized for detection and/or diagnosis of SARS-CoV-2 by FDA under an Emergency Use Authorization (EUA). This EUA will remain in effect (meaning this test can be used) for the duration of the COVID-19 declaration under Section 564(b)(1) of the Act, 21 U.S.C. section 360bbb-3(b)(1), unless the authorization is terminated or revoked.     Resp Syncytial Virus by PCR NEGATIVE NEGATIVE Final    Comment: (NOTE) Fact Sheet for Patients: bloggercourse.com  Fact Sheet for Healthcare Providers: seriousbroker.it  This test is not yet approved or cleared by the [place]  FDA and has been authorized for detection and/or diagnosis of SARS-CoV-2 by FDA under an Emergency Use Authorization (EUA). This EUA will remain in effect (meaning this test can be used) for the duration of the COVID-19 declaration under Section 564(b)(1) of the Act, 21 U.S.C. section 360bbb-3(b)(1), unless the authorization is terminated or revoked.  Performed at [facility], [address] [place], [place] [address]   Blood Culture (routine x 2)     Status: None (Preliminary result)   Collection Time: 12/14/24  4:22 PM   Specimen: BLOOD  Result Value Ref Range Status   Specimen Description BLOOD BLOOD LEFT FOREARM  Final   Special Requests   Final    BOTTLES DRAWN AEROBIC AND ANAEROBIC Blood Culture results may not be optimal due to an inadequate volume of blood received in culture bottles   Culture   Final    NO GROWTH 2 DAYS Performed at [facility], [address], [place], [place] [address]    Report Status PENDING  Incomplete   Blood Culture (routine x 2)     Status: None (Preliminary result)   Collection Time: 12/14/24  4:22 PM   Specimen: BLOOD  Result Value Ref Range Status   Specimen Description BLOOD RIGHT ANTECUBITAL  Final   Special Requests   Final    BOTTLES DRAWN AEROBIC AND ANAEROBIC Blood Culture results may not be optimal due to an inadequate volume of blood received in culture bottles   Culture   Final  NO GROWTH 2 DAYS Performed at [facility], [address] [place], [place] [address]    Report Status PENDING  Incomplete     Labs: BNP (last 3 results) No results for input(s): BNP in the last 8760 hours. Basic Metabolic Panel: Recent Labs  Lab 12/14/24 1540 12/14/24 2117  NA 137  --   K 3.9  --   CL 103  --   CO2 22  --   GLUCOSE 170*  --   BUN 15  --   CREATININE 0.83 0.78  CALCIUM  8.8*  --    Liver Function Tests: Recent Labs  Lab 12/14/24 1540  AST 15  ALT 13  ALKPHOS 82  BILITOT 1.0  PROT 6.4*  ALBUMIN 3.9   No results for input(s): LIPASE, AMYLASE in the last 168 hours. No results for input(s): AMMONIA in the last 168 hours. CBC: Recent Labs  Lab 12/14/24 1540 12/14/24 2117  WBC 10.9* 9.1  HGB 12.7* 11.2*  HCT 37.1* 33.9*  MCV 86.3 88.5  PLT 149* 127*   Cardiac Enzymes: No results for input(s): CKTOTAL, CKMB, CKMBINDEX, TROPONINI in the last 168 hours. BNP: Invalid input(s): POCBNP CBG: No results for input(s): GLUCAP in the last 168 hours. D-Dimer No results for input(s): DDIMER in the last 72 hours. Hgb A1c No results for input(s): HGBA1C in the last 72 hours. Lipid Profile No results for input(s): CHOL, HDL, LDLCALC, TRIG, CHOLHDL, LDLDIRECT in the last 72 hours. Thyroid  function studies No results for input(s): TSH, T4TOTAL, T3FREE, THYROIDAB in the last 72 hours.  Invalid input(s): FREET3 Anemia work up No results for input(s): VITAMINB12, FOLATE, FERRITIN, TIBC, IRON,  RETICCTPCT in the last 72 hours. Urinalysis    Component Value Date/Time   COLORURINE AMBER (A) 12/14/2024 1540   APPEARANCEUR CLOUDY (A) 12/14/2024 1540   APPEARANCEUR Slightly cloudy 12/04/2024 1508   LABSPEC 1.020 12/14/2024 1540   PHURINE 7.0 12/14/2024 1540   GLUCOSEU NEGATIVE 12/14/2024 1540   HGBUR SMALL (A) 12/14/2024 1540   BILIRUBINUR NEGATIVE 12/14/2024 1540   BILIRUBINUR Negative 12/04/2024 1508   KETONESUR NEGATIVE 12/14/2024 1540   PROTEINUR >=300 (A) 12/14/2024 1540   UROBILINOGEN 0.2 12/08/2023 1204   NITRITE NEGATIVE 12/14/2024 1540   LEUKOCYTESUR LARGE (A) 12/14/2024 1540   Sepsis Labs Recent Labs  Lab 12/14/24 1540 12/14/24 2117  WBC 10.9* 9.1   Microbiology Recent Results (from the past 240 hours)  Urine Culture     Status: Abnormal   Collection Time: 12/14/24  3:40 PM   Specimen: Urine, Clean Catch  Result Value Ref Range Status   Specimen Description   Final    URINE, CLEAN CATCH Performed at [facility], [address], [place], [place] [address]    Special Requests   Final    NONE Performed at [facility], [address], [place], [place] [address]    Culture >=100,000 COLONIES/mL KLEBSIELLA PNEUMONIAE (A)  Final   Report Status 12/16/2024 FINAL  Final   Organism ID, Bacteria KLEBSIELLA PNEUMONIAE (A)  Final      Susceptibility   Klebsiella pneumoniae - MIC*    AMPICILLIN RESISTANT Resistant     CEFAZOLIN (URINE) Value in next row Sensitive      2 SENSITIVEThis is a modified FDA-approved test that has been validated and its performance characteristics determined by the reporting laboratory.  This laboratory is certified under the Clinical Laboratory Improvement Amendments CLIA as qualified to perform high complexity clinical laboratory testing.  CEFEPIME  Value in next row Sensitive      2 SENSITIVEThis is a modified FDA-approved test that has been validated and its performance characteristics determined by the  reporting laboratory.  This laboratory is certified under the Clinical Laboratory Improvement Amendments CLIA as qualified to perform high complexity clinical laboratory testing.    ERTAPENEM Value in next row Sensitive      2 SENSITIVEThis is a modified FDA-approved test that has been validated and its performance characteristics determined by the reporting laboratory.  This laboratory is certified under the Clinical Laboratory Improvement Amendments CLIA as qualified to perform high complexity clinical laboratory testing.    CEFTRIAXONE  Value in next row Sensitive      2 SENSITIVEThis is a modified FDA-approved test that has been validated and its performance characteristics determined by the reporting laboratory.  This laboratory is certified under the Clinical Laboratory Improvement Amendments CLIA as qualified to perform high complexity clinical laboratory testing.    CIPROFLOXACIN  Value in next row Sensitive      2 SENSITIVEThis is a modified FDA-approved test that has been validated and its performance characteristics determined by the reporting laboratory.  This laboratory is certified under the Clinical Laboratory Improvement Amendments CLIA as qualified to perform high complexity clinical laboratory testing.    GENTAMICIN Value in next row Sensitive      2 SENSITIVEThis is a modified FDA-approved test that has been validated and its performance characteristics determined by the reporting laboratory.  This laboratory is certified under the Clinical Laboratory Improvement Amendments CLIA as qualified to perform high complexity clinical laboratory testing.    NITROFURANTOIN  Value in next row Sensitive      2 SENSITIVEThis is a modified FDA-approved test that has been validated and its performance characteristics determined by the reporting laboratory.  This laboratory is certified under the Clinical Laboratory Improvement Amendments CLIA as qualified to perform high complexity clinical laboratory  testing.    TRIMETH /SULFA  Value in next row Sensitive      2 SENSITIVEThis is a modified FDA-approved test that has been validated and its performance characteristics determined by the reporting laboratory.  This laboratory is certified under the Clinical Laboratory Improvement Amendments CLIA as qualified to perform high complexity clinical laboratory testing.    AMPICILLIN/SULBACTAM Value in next row Sensitive      2 SENSITIVEThis is a modified FDA-approved test that has been validated and its performance characteristics determined by the reporting laboratory.  This laboratory is certified under the Clinical Laboratory Improvement Amendments CLIA as qualified to perform high complexity clinical laboratory testing.    PIP/TAZO Value in next row Sensitive      <=4 SENSITIVEThis is a modified FDA-approved test that has been validated and its performance characteristics determined by the reporting laboratory.  This laboratory is certified under the Clinical Laboratory Improvement Amendments CLIA as qualified to perform high complexity clinical laboratory testing.    MEROPENEM Value in next row Sensitive      <=4 SENSITIVEThis is a modified FDA-approved test that has been validated and its performance characteristics determined by the reporting laboratory.  This laboratory is certified under the Clinical Laboratory Improvement Amendments CLIA as qualified to perform high complexity clinical laboratory testing.    * >=100,000 COLONIES/mL KLEBSIELLA PNEUMONIAE  Resp panel by RT-PCR (RSV, Flu A&B, Covid) Anterior Nasal Swab     Status: None   Collection Time: 12/14/24  4:01 PM   Specimen: Anterior Nasal Swab  Result Value Ref Range Status   SARS Coronavirus 2  by RT PCR NEGATIVE NEGATIVE Final    Comment: (NOTE) SARS-CoV-2 target nucleic acids are NOT DETECTED.  The SARS-CoV-2 RNA is generally detectable in upper respiratory specimens during the acute phase of infection. The lowest concentration of  SARS-CoV-2 viral copies this assay can detect is 138 copies/mL. A negative result does not preclude SARS-Cov-2 infection and should not be used as the sole basis for treatment or other patient management decisions. A negative result may occur with  improper specimen collection/handling, submission of specimen other than nasopharyngeal swab, presence of viral mutation(s) within the areas targeted by this assay, and inadequate number of viral copies(<138 copies/mL). A negative result must be combined with clinical observations, patient history, and epidemiological information. The expected result is Negative.  Fact Sheet for Patients:  bloggercourse.com  Fact Sheet for Healthcare Providers:  seriousbroker.it  This test is no t yet approved or cleared by the [place]  FDA and  has been authorized for detection and/or diagnosis of SARS-CoV-2 by FDA under an Emergency Use Authorization (EUA). This EUA will remain  in effect (meaning this test can be used) for the duration of the COVID-19 declaration under Section 564(b)(1) of the Act, 21 U.S.C.section 360bbb-3(b)(1), unless the authorization is terminated  or revoked sooner.       Influenza A by PCR NEGATIVE NEGATIVE Final   Influenza B by PCR NEGATIVE NEGATIVE Final    Comment: (NOTE) The Xpert Xpress SARS-CoV-2/FLU/RSV plus assay is intended as an aid in the diagnosis of influenza from Nasopharyngeal swab specimens and should not be used as a sole basis for treatment. Nasal washings and aspirates are unacceptable for Xpert Xpress SARS-CoV-2/FLU/RSV testing.  Fact Sheet for Patients: bloggercourse.com  Fact Sheet for Healthcare Providers: seriousbroker.it  This test is not yet approved or cleared by the [place]  FDA and has been authorized for detection and/or diagnosis of SARS-CoV-2 by FDA under an Emergency Use  Authorization (EUA). This EUA will remain in effect (meaning this test can be used) for the duration of the COVID-19 declaration under Section 564(b)(1) of the Act, 21 U.S.C. section 360bbb-3(b)(1), unless the authorization is terminated or revoked.     Resp Syncytial Virus by PCR NEGATIVE NEGATIVE Final    Comment: (NOTE) Fact Sheet for Patients: bloggercourse.com  Fact Sheet for Healthcare Providers: seriousbroker.it  This test is not yet approved or cleared by the [place]  FDA and has been authorized for detection and/or diagnosis of SARS-CoV-2 by FDA under an Emergency Use Authorization (EUA). This EUA will remain in effect (meaning this test can be used) for the duration of the COVID-19 declaration under Section 564(b)(1) of the Act, 21 U.S.C. section 360bbb-3(b)(1), unless the authorization is terminated or revoked.  Performed at [facility], [address] [place], [place] [address]   Blood Culture (routine x 2)     Status: None (Preliminary result)   Collection Time: 12/14/24  4:22 PM   Specimen: BLOOD  Result Value Ref Range Status   Specimen Description BLOOD BLOOD LEFT FOREARM  Final   Special Requests   Final    BOTTLES DRAWN AEROBIC AND ANAEROBIC Blood Culture results may not be optimal due to an inadequate volume of blood received in culture bottles   Culture   Final    NO GROWTH 2 DAYS Performed at [facility], [address] [place], [place] [address]    Report Status PENDING  Incomplete  Blood Culture (routine x 2)     Status: None (Preliminary result)  Collection Time: 12/14/24  4:22 PM   Specimen: BLOOD  Result Value Ref Range Status   Specimen Description BLOOD RIGHT ANTECUBITAL  Final   Special Requests   Final    BOTTLES DRAWN AEROBIC AND ANAEROBIC Blood Culture results may not be optimal due to an inadequate volume of blood received in culture bottles   Culture   Final     NO GROWTH 2 DAYS Performed at [facility], [address], [place], [place] [address]    Report Status PENDING  Incomplete   Imaging ECHOCARDIOGRAM COMPLETE Result Date: 12/15/2024    ECHOCARDIOGRAM REPORT   Patient Name:   Beverly Suriano. Date of Exam: 12/15/2024 Medical Rec #:  [id number]              Height:       74.0 in Accession #:    [id number]             Weight:       198.0 lb Date of Birth:  Dec 04, 1946               BSA:          2.164 m Patient Age:    78 years               BP:           131/67 mmHg Patient Gender: M                      HR:           69 bpm. Exam Location:  [facility] Procedure: 2D Echo, 3D Echo, Color Doppler, Cardiac Doppler and Strain Analysis            (Both Spectral and Color Flow Doppler were utilized during            procedure). Indications:     Syncope  History:         Patient has prior history of Echocardiogram examinations, most                  recent 01/05/2010. Risk Factors:Hypertension, Diabetes,                  Dyslipidemia and Sleep Apnea.  Sonographer:     Logan Shove RDCS Referring Phys:  [id number] LANETA BLUNT Diagnosing Phys: Redell Cave MD IMPRESSIONS  1. Left ventricular ejection fraction, by estimation, is 60 to 65%. The left ventricle has normal function. The left ventricle has no regional wall motion abnormalities. Left ventricular diastolic parameters are consistent with Grade II diastolic dysfunction (pseudonormalization). The average left ventricular global longitudinal strain is -19.1 %. The global longitudinal strain is normal.  2. Right ventricular systolic function is normal. The right ventricular size is mildly enlarged.  3. Left atrial size was mild to moderately dilated.  4. Right atrial size was moderately dilated.  5. The mitral valve is normal in structure. Mild mitral valve regurgitation.  6. The aortic valve is tricuspid. Aortic valve regurgitation is mild. Aortic valve sclerosis is present, with no evidence of aortic  valve stenosis. FINDINGS  Left Ventricle: Left ventricular ejection fraction, by estimation, is 60 to 65%. The left ventricle has normal function. The left ventricle has no regional wall motion abnormalities. The average left ventricular global longitudinal strain is -19.1 %. Strain was performed and the global longitudinal strain is normal. The left ventricular internal cavity size was normal in size. There is no left  ventricular hypertrophy. Left ventricular diastolic parameters are consistent with Grade II diastolic dysfunction (pseudonormalization). Right Ventricle: The right ventricular size is mildly enlarged. No increase in right ventricular wall thickness. Right ventricular systolic function is normal. Left Atrium: Left atrial size was mild to moderately dilated. Right Atrium: Right atrial size was moderately dilated. Pericardium: There is no evidence of pericardial effusion. Mitral Valve: The mitral valve is normal in structure. Mild mitral valve regurgitation. Tricuspid Valve: The tricuspid valve is normal in structure. Tricuspid valve regurgitation is mild. Aortic Valve: The aortic valve is tricuspid. Aortic valve regurgitation is mild. Aortic valve sclerosis is present, with no evidence of aortic valve stenosis. Aortic valve peak gradient measures 9.7 mmHg. Pulmonic Valve: The pulmonic valve was not well visualized. Pulmonic valve regurgitation is not visualized. Aorta: The aortic root and ascending aorta are structurally normal, with no evidence of dilitation. IAS/Shunts: No atrial level shunt detected by color flow Doppler.  LEFT VENTRICLE PLAX 2D LVIDd:         5.90 cm   Diastology LVIDs:         3.40 cm   LV e' medial:    5.11 cm/s LV PW:         0.90 cm   LV E/e' medial:  16.3 LV IVS:        0.90 cm   LV e' lateral:   6.31 cm/s LVOT diam:     2.00 cm   LV E/e' lateral: 13.2 LVOT Area:     3.14 cm LV IVRT:       148 msec  2D Longitudinal Strain                          2D Strain GLS Avg:     -19.1 %                            3D Volume EF:                          3D EF:        66 %                          LV EDV:       195 ml                          LV ESV:       66 ml                          LV SV:        128 ml RIGHT VENTRICLE             IVC RV Basal diam:  5.20 cm     IVC diam: 2.40 cm RV Mid diam:    3.20 cm RV S prime:     17.90 cm/s TAPSE (M-mode): 3.1 cm LEFT ATRIUM              Index        RIGHT ATRIUM           Index LA diam:        4.50 cm  2.08 cm/m   RA Area:     37.10 cm LA Vol (A2C):   107.0 ml 49.44  ml/m  RA Volume:   153.00 ml 70.70 ml/m LA Vol (A4C):   92.8 ml  42.88 ml/m LA Biplane Vol: 103.0 ml 47.59 ml/m  AORTIC VALVE AV Area (Vmax): 2.74 cm AV Vmax:        156.00 cm/s AV Peak Grad:   9.7 mmHg LVOT Vmax:      136.00 cm/s  AORTA Ao Root diam: 3.20 cm Ao Asc diam:  3.70 cm MITRAL VALVE               TRICUSPID VALVE MV Area (PHT): 4.15 cm    TR Peak grad:   20.4 mmHg MV Decel Time: 183 msec    TR Vmax:        226.00 cm/s MV E velocity: 83.20 cm/s MV A velocity: 80.50 cm/s  SHUNTS MV E/A ratio:  1.03        Systemic Diam: 2.00 cm Redell Cave MD Electronically signed by Redell Cave MD Signature Date/Time: 12/15/2024/1:14:52 PM    Final       Time coordinating discharge: over 30 minutes  SIGNED:  Chapman Matteucci DO [facility]       [1]  Allergies Allergen Reactions   Ace Inhibitors     COUGH   "

## 2024-12-16 NOTE — Plan of Care (Signed)

## 2024-12-16 NOTE — Care Management Important Message (Signed)
 Important Message  Patient Details  Name: Bruce Wyatt. MRN: [id number] Date of Birth: 1946-09-01   Important Message Given:  Yes - Medicare IM     Briana Newman W, CMA 12/16/2024, 1:00 PM

## 2024-12-17 ENCOUNTER — Encounter: Payer: Self-pay | Admitting: *Deleted

## 2024-12-17 ENCOUNTER — Telehealth: Payer: Self-pay | Admitting: *Deleted

## 2024-12-17 NOTE — Telephone Encounter (Signed)
 This encounter was created in error - please disregard.

## 2024-12-17 NOTE — Transitions of Care (Post Inpatient/ED Visit) (Signed)
" ° °  12/17/2024  Name: Bruce Wyatt. MRN: [id number] DOB: 11-05-1946  Today's TOC FU Call Status: Today's TOC FU Call Status:: Unsuccessful Call (1st Attempt) Unsuccessful Call (1st Attempt) Date: 12/17/24  Attempted to reach the patient regarding the most recent Inpatient/ED visit.  Follow Up Plan: Additional outreach attempts will be made to reach the patient to complete the Transitions of Care (Post Inpatient/ED visit) call.   Cathlean Headland BSN RN [facility] Health Care Management Coordinator [email or web address] Direct Dial: [phone]  Fax: [phone] Website: [email or web address]  "

## 2024-12-18 ENCOUNTER — Telehealth: Payer: Self-pay

## 2024-12-18 NOTE — Transitions of Care (Post Inpatient/ED Visit) (Signed)
" ° °  12/18/2024  Name: Bruce Wyatt. MRN: [id number] DOB: 08-22-1946  Today's TOC FU Call Status: Today's TOC FU Call Status:: Unsuccessful Call (2nd Attempt) Unsuccessful Call (2nd Attempt) Date: 12/18/24  Attempted to reach the patient regarding the most recent Inpatient/ED visit.  Follow Up Plan: Additional outreach attempts will be made to reach the patient to complete the Transitions of Care (Post Inpatient/ED visit) call.   Seletha Zimmermann J. Tyrin Herbers RN, MSN [facility], [facility] RN Care Manager Direct Dial: [phone]  Fax: [phone] Website: [email or web address]   "

## 2024-12-19 LAB — CULTURE, BLOOD (ROUTINE X 2)
Culture: NO GROWTH
Culture: NO GROWTH

## 2024-12-23 ENCOUNTER — Other Ambulatory Visit: Payer: Self-pay

## 2024-12-23 ENCOUNTER — Encounter
Admission: RE | Admit: 2024-12-23 | Discharge: 2024-12-23 | Disposition: A | Discharge status: Home / Self Care | Source: Ambulatory Visit | Attending: Urology | Admitting: Urology

## 2024-12-23 DIAGNOSIS — E119 Type 2 diabetes mellitus without complications: Secondary | ICD-10-CM

## 2024-12-23 DIAGNOSIS — N400 Enlarged prostate without lower urinary tract symptoms: Secondary | ICD-10-CM

## 2024-12-23 HISTORY — DX: Depression, unspecified: F32.A

## 2024-12-23 NOTE — Patient Instructions (Addendum)
 Your procedure is scheduled on: 12/30/24 - Monday Report to the Registration Desk on the 1st floor of the [facility]. To find out your arrival time, please call [phone] between 1PM - 3PM on: 12/27/24 - Friday If your arrival time is 6:00 am, do not arrive before that time as the Medical Mall entrance doors do not open until 6:00 am.  REMEMBER: Instructions that are not followed completely may result in serious medical risk, up to and including death; or upon the discretion of your surgeon and anesthesiologist your surgery may need to be rescheduled.  Do not eat food or drink any liquids after midnight the night before surgery.  No gum chewing or hard candies.  One week prior to surgery: Stop Anti-inflammatories (NSAIDS) such as Advil, Aleve, Ibuprofen, Motrin, Naproxen, Naprosyn and Aspirin based products such as Excedrin, Goody's Powder, BC Powder. You may take Tylenol  if needed for pain up until the day of surgery.   Stop ANY OVER THE COUNTER supplements until after surgery - MULTIVITAMIN   metFORMIN  (GLUCOPHAGE ) hold beginning 12/28/24.  Semaglutide  (RYBELSUS ) hold beginning 12/29/24.  ON THE DAY OF SURGERY ONLY TAKE THESE MEDICATIONS WITH SIPS OF WATER:  carbidopa -levodopa   pantoprazole  (PROTONIX )  silodosin  (RAPAFLO )  venlafaxine  XR (EFFEXOR -XR)    No Alcohol for 24 hours before or after surgery.  No Smoking including e-cigarettes for 24 hours before surgery.  No chewable tobacco products for at least 6 hours before surgery.  No nicotine patches on the day of surgery.  Do not use any recreational drugs for at least a week (preferably 2 weeks) before your surgery.  Please be advised that the combination of cocaine and anesthesia may have negative outcomes, up to and including death. If you test positive for cocaine, your surgery will be cancelled.  On the morning of surgery brush your teeth with toothpaste and water, you may rinse your mouth with mouthwash if you  wish. Do not swallow any toothpaste or mouthwash.  Do not wear jewelry, make-up, hairpins, clips or nail polish.  For welded (permanent) jewelry: bracelets, anklets, waist bands, etc.  Please have this removed prior to surgery.  If it is not removed, there is a chance that hospital personnel will need to cut it off on the day of surgery.  Do not wear lotions, powders, or perfumes.   Do not shave body hair from the neck down 48 hours before surgery.  Contact lenses, hearing aids and dentures may not be worn into surgery.  Do not bring valuables to the hospital. [facility] is not responsible for any missing/lost belongings or valuables.    Notify your doctor if there is any change in your medical condition (cold, fever, infection).  Wear comfortable clothing (specific to your surgery type) to the hospital.  After surgery, you can help prevent lung complications by doing breathing exercises.  Take deep breaths and cough every 1-2 hours. Your doctor may order a device called an Incentive Spirometer to help you take deep breaths. If you are being admitted to the hospital overnight, leave your suitcase in the car. After surgery it may be brought to your room.  In case of increased patient census, it may be necessary for you, the patient, to continue your postoperative care in the Same Day Surgery department.  If you are being discharged the day of surgery, you will not be allowed to drive home. You will need a responsible individual to drive you home and stay with you for 24 hours after  surgery.   If you are taking public transportation, you will need to have a responsible individual with you.  Please call the Pre-admissions Testing Dept. at [phone] if you have any questions about these instructions.  Surgery Visitation Policy:  Patients having surgery or a procedure may have two visitors.  Children under the age of 58 must have an adult with them who is not the  patient.  Inpatient Visitation:    Visiting hours are 7 a.m. to 8 p.m. Up to four visitors are allowed at one time in a patient room. The visitors may rotate out with other people during the day.  One visitor age 33 or older may stay with the patient overnight and must be in the room by 8 p.m.   Merchandiser, Retail to address health-related social needs:  [email or web address]

## 2024-12-30 ENCOUNTER — Encounter: Payer: Self-pay | Admitting: Urology

## 2024-12-30 ENCOUNTER — Ambulatory Visit: Admitting: Certified Registered"

## 2024-12-30 ENCOUNTER — Ambulatory Visit
Admission: RE | Admit: 2024-12-30 | Discharge: 2024-12-30 | Disposition: A | Discharge status: Home / Self Care | Attending: Urology | Admitting: Urology

## 2024-12-30 ENCOUNTER — Encounter
Admission: RE | Disposition: A | Discharge status: Home / Self Care | Payer: Self-pay | Source: Home / Self Care | Attending: Urology

## 2024-12-30 ENCOUNTER — Other Ambulatory Visit: Payer: Self-pay

## 2024-12-30 DIAGNOSIS — N138 Other obstructive and reflux uropathy: Secondary | ICD-10-CM | POA: Diagnosis present

## 2024-12-30 DIAGNOSIS — R338 Other retention of urine: Secondary | ICD-10-CM | POA: Insufficient documentation

## 2024-12-30 DIAGNOSIS — E119 Type 2 diabetes mellitus without complications: Secondary | ICD-10-CM | POA: Insufficient documentation

## 2024-12-30 DIAGNOSIS — Z8249 Family history of ischemic heart disease and other diseases of the circulatory system: Secondary | ICD-10-CM | POA: Insufficient documentation

## 2024-12-30 DIAGNOSIS — G4730 Sleep apnea, unspecified: Secondary | ICD-10-CM | POA: Insufficient documentation

## 2024-12-30 DIAGNOSIS — K219 Gastro-esophageal reflux disease without esophagitis: Secondary | ICD-10-CM | POA: Diagnosis not present

## 2024-12-30 DIAGNOSIS — N401 Enlarged prostate with lower urinary tract symptoms: Secondary | ICD-10-CM | POA: Insufficient documentation

## 2024-12-30 DIAGNOSIS — G20A1 Parkinson's disease without dyskinesia, without mention of fluctuations: Secondary | ICD-10-CM | POA: Insufficient documentation

## 2024-12-30 DIAGNOSIS — Z833 Family history of diabetes mellitus: Secondary | ICD-10-CM | POA: Insufficient documentation

## 2024-12-30 DIAGNOSIS — I10 Essential (primary) hypertension: Secondary | ICD-10-CM | POA: Diagnosis not present

## 2024-12-30 HISTORY — PX: HOLEP-LASER ENUCLEATION OF THE PROSTATE WITH MORCELLATION: SHX6641

## 2024-12-30 LAB — GLUCOSE, CAPILLARY
Glucose-Capillary: 152 mg/dL — ABNORMAL HIGH (ref 70–99)
Glucose-Capillary: 111 mg/dL — ABNORMAL HIGH (ref 70–99)

## 2024-12-30 SURGERY — ENUCLEATION, PROSTATE, USING LASER, WITH MORCELLATION
Anesthesia: General

## 2024-12-30 MED ORDER — FENTANYL CITRATE (PF) 100 MCG/2ML IJ SOLN
INTRAMUSCULAR | Status: DC | PRN
  Administered 2024-12-30 (×2): 50 ug via INTRAVENOUS

## 2024-12-30 MED ORDER — PHENYLEPHRINE 80 MCG/ML (10ML) SYRINGE FOR IV PUSH (FOR BLOOD PRESSURE SUPPORT)
PREFILLED_SYRINGE | INTRAVENOUS | Status: DC | PRN
  Administered 2024-12-30: 160 ug via INTRAVENOUS

## 2024-12-30 MED ORDER — SUCCINYLCHOLINE CHLORIDE 200 MG/10ML IV SOSY
PREFILLED_SYRINGE | INTRAVENOUS | Status: DC | PRN
  Administered 2024-12-30: 100 mg via INTRAVENOUS

## 2024-12-30 MED ORDER — ROCURONIUM BROMIDE 100 MG/10ML IV SOLN
INTRAVENOUS | Status: DC | PRN
  Administered 2024-12-30: 50 mg via INTRAVENOUS
  Administered 2024-12-30: 20 mg via INTRAVENOUS
  Administered 2024-12-30: 10 mg via INTRAVENOUS

## 2024-12-30 MED ORDER — PROPOFOL 1000 MG/100ML IV EMUL
INTRAVENOUS | Status: AC
  Filled 2024-12-30: qty 200

## 2024-12-30 MED ORDER — TRAMADOL HCL 50 MG PO TABS: 25.0000 mg | ORAL_TABLET | Freq: Four times a day (QID) | ORAL | 0 refills | Status: AC | PRN

## 2024-12-30 MED ORDER — ACETAMINOPHEN 10 MG/ML IV SOLN: 1000.0000 mg | Freq: Once | INTRAVENOUS | Status: DC | PRN

## 2024-12-30 MED ORDER — CEFAZOLIN SODIUM-DEXTROSE 2-4 GM/100ML-% IV SOLN
2.0000 g | INTRAVENOUS | Status: AC
  Administered 2024-12-30: 2 g via INTRAVENOUS

## 2024-12-30 MED ORDER — PHENYLEPHRINE HCL-NACL 20-0.9 MG/250ML-% IV SOLN
INTRAVENOUS | Status: DC | PRN
  Administered 2024-12-30: 20 ug/min via INTRAVENOUS

## 2024-12-30 MED ORDER — DEXAMETHASONE SOD PHOSPHATE PF 10 MG/ML IJ SOLN
INTRAMUSCULAR | Status: DC | PRN
  Administered 2024-12-30: 5 mg via INTRAVENOUS

## 2024-12-30 MED ORDER — MIDAZOLAM HCL 2 MG/2ML IJ SOLN
INTRAMUSCULAR | Status: AC
  Filled 2024-12-30: qty 2

## 2024-12-30 MED ORDER — SUGAMMADEX SODIUM 200 MG/2ML IV SOLN
INTRAVENOUS | Status: DC | PRN
  Administered 2024-12-30: 200 mg via INTRAVENOUS

## 2024-12-30 MED ORDER — ONDANSETRON HCL 4 MG/2ML IJ SOLN
INTRAMUSCULAR | Status: DC | PRN
  Administered 2024-12-30 (×2): 4 mg via INTRAVENOUS

## 2024-12-30 MED ORDER — CEPHALEXIN 500 MG PO CAPS: 500.0000 mg | ORAL_CAPSULE | Freq: Two times a day (BID) | ORAL | 0 refills | Status: AC

## 2024-12-30 MED ORDER — GLYCOPYRROLATE 0.2 MG/ML IJ SOLN
INTRAMUSCULAR | Status: DC | PRN
  Administered 2024-12-30: .2 mg via INTRAVENOUS

## 2024-12-30 MED ORDER — OXYCODONE HCL 5 MG PO TABS: 5.0000 mg | ORAL_TABLET | Freq: Once | ORAL | Status: DC | PRN

## 2024-12-30 MED ORDER — PROPOFOL 10 MG/ML IV BOLUS
INTRAVENOUS | Status: DC | PRN
  Administered 2024-12-30: 150 mg via INTRAVENOUS

## 2024-12-30 MED ORDER — SODIUM CHLORIDE 0.9 % IR SOLN
Status: DC | PRN
  Administered 2024-12-30 (×3): 6000 mL

## 2024-12-30 MED ORDER — LIDOCAINE HCL (CARDIAC) PF 100 MG/5ML IV SOSY
PREFILLED_SYRINGE | INTRAVENOUS | Status: DC | PRN
  Administered 2024-12-30: 100 mg via INTRAVENOUS

## 2024-12-30 MED ORDER — DROPERIDOL 2.5 MG/ML IJ SOLN: 0.6250 mg | Freq: Once | INTRAMUSCULAR | Status: DC | PRN

## 2024-12-30 MED ORDER — OXYCODONE HCL 5 MG/5ML PO SOLN: 5.0000 mg | Freq: Once | ORAL | Status: DC | PRN

## 2024-12-30 MED ORDER — ACETAMINOPHEN 10 MG/ML IV SOLN
INTRAVENOUS | Status: DC | PRN
  Administered 2024-12-30: 1000 mg via INTRAVENOUS

## 2024-12-30 MED ORDER — FENTANYL CITRATE (PF) 100 MCG/2ML IJ SOLN
INTRAMUSCULAR | Status: AC
  Filled 2024-12-30: qty 2

## 2024-12-30 MED ORDER — FENTANYL CITRATE (PF) 100 MCG/2ML IJ SOLN: 25.0000 ug | INTRAMUSCULAR | Status: DC | PRN

## 2024-12-30 MED ORDER — SODIUM CHLORIDE 0.9 % IV SOLN: INTRAVENOUS | Status: DC

## 2024-12-30 MED ORDER — CEFAZOLIN SODIUM-DEXTROSE 2-4 GM/100ML-% IV SOLN
INTRAVENOUS | Status: AC
  Filled 2024-12-30: qty 100

## 2024-12-30 SURGICAL SUPPLY — 25 items
ADAPTER IRRIG TUBE 2 SPIKE SOL (ADAPTER) ×2 IMPLANT
BAG URO DRAIN 4000ML (MISCELLANEOUS) ×1 IMPLANT
CATH URETL OPEN END 4X70 (CATHETERS) ×1 IMPLANT
CATH URTH STD 24FR FL 3W 2 (CATHETERS) ×1 IMPLANT
CONTAINER COLLECT MORCELLATR (MISCELLANEOUS) ×1 IMPLANT
DRAPE UTILITY 15X26 TOWEL STRL (DRAPES) IMPLANT
ELECTRD BIVAP BIPO 22/24 DONUT (ELECTROSURGICAL) IMPLANT
FIBER LASER MOSES 550 DFL (Laser) ×1 IMPLANT
FILTER OVERFLOW MORCELLATOR (FILTER) ×1 IMPLANT
GLOVE BIOGEL PI IND STRL 7.5 (GLOVE) ×2 IMPLANT
GOWN STRL REUS W/ TWL LRG LVL3 (GOWN DISPOSABLE) ×1 IMPLANT
GOWN STRL REUS W/ TWL XL LVL3 (GOWN DISPOSABLE) ×1 IMPLANT
HOLDER FOLEY CATH W/STRAP (MISCELLANEOUS) ×1 IMPLANT
KIT TURNOVER CYSTO (KITS) ×1 IMPLANT
MEMBRANE SLNG YLW 17 FOR INST (MISCELLANEOUS) ×1 IMPLANT
MORCELLATOR ROTATION 4.75 335 (MISCELLANEOUS) ×1 IMPLANT
PACK CYSTO AR (MISCELLANEOUS) ×1 IMPLANT
SET CYSTO IRRIGATION (SET/KITS/TRAYS/PACK) ×1 IMPLANT
SET IRRIG Y-TYPE CYSTO (SET/KITS/TRAYS/PACK) ×1 IMPLANT
SLEEVE PROTECTION STRL DISP (MISCELLANEOUS) ×2 IMPLANT
SOL .9 NS 3000ML IRR UROMATIC (IV SOLUTION) ×6 IMPLANT
SOLN STERILE WATER BTL 1000 ML (IV SOLUTION) ×1 IMPLANT
SURGILUBE 2OZ TUBE FLIPTOP (MISCELLANEOUS) ×1 IMPLANT
SYRINGE TOOMEY IRRIG 70ML (MISCELLANEOUS) ×1 IMPLANT
TUBE PUMP MORCELLATOR PIRANHA (TUBING) ×1 IMPLANT

## 2024-12-30 NOTE — H&P (Signed)
"  12/30/2024 7:08 AM   Bruce Wyatt. September 09, 1946 [id number]  CC: BPH with recurrent UTI, retention  HPI: 79 year old male with well-controlled Parkinson's disease, urinary retention, recurrent UTI who opted for HOLEP for definitive management.  Prostate measured 90 g on CT.  Recent hospitalization for weakness/sepsis from urinary source.   PMH: Past Medical History:  Diagnosis Date   Abnormal colonoscopy    polyps noted, benign, [facility], rec 10 year follow up   Alcoholism (HCC)    11 yrs ago    BPH (benign prostatic hyperplasia)    Depression    Diabetes mellitus    Diverticulosis    Gallstones    GERD (gastroesophageal reflux disease)    H/O hematuria    Hemorrhoids    Hyperlipidemia    Hypertension    Iron deficiency    Sleep apnea    Vitamin D  deficiency     Surgical History: Past Surgical History:  Procedure Laterality Date   CARDIOVASCULAR STRESS TEST  02/12/2009   Negative/AT [facility] Dr Lynwood Bare   COLONOSCOPY     CYSTOSCOPY  07/03/2012   RHINOPLASTY     soft palate reduction for apnea   Soft palate reduction for Sleep Apnea     [facility]   TONSILLECTOMY     UPPER GASTROINTESTINAL ENDOSCOPY     VASECTOMY      Family History: Family History  Problem Relation Age of Onset   Breast cancer Mother    Heart disease Father        Heart Attack   Colon cancer Maternal Grandfather    Diabetes Paternal Grandfather    Heart disease Paternal Grandfather    Coronary artery disease Father    Nephrolithiasis Daughter     Social History:  reports that he has never smoked. He has never used smokeless tobacco. He reports that he does not drink alcohol and does not use drugs.  Physical Exam: BP 134/83   Pulse 78   Temp 97.9 F (36.6 C)   Resp 16   Ht 6' 2 (1.88 m)   Wt 89.8 kg   SpO2 98%   BMI 25.42 kg/m    Constitutional:  Alert and oriented, No acute distress. Cardiovascular: regular rate and rhythm Respiratory: Clear to auscultation  bilaterally GI: Abdomen is soft, nontender, nondistended, no abdominal masses   Laboratory Data: Urine culture 12/27 with Klebsiella, treated with culture appropriate antibiotics  Assessment & Plan:   79 year old male with well-controlled Parkinson's, BPH and Foley dependent urinary retention, 90 g prostate, recurrent UTI who opted for HOLEP for definitive management.  We discussed the risks and benefits of HoLEP at length.  The procedure requires general anesthesia and takes 1 to 2 hours, and a holmium laser is used to enucleate the prostate and push this tissue into the bladder.  A morcellator is then used to remove this tissue, which is sent for pathology.  The vast majority(>95%) of patients are able to discharge the same day with a catheter in place for 2 to 3 days, and will follow-up in clinic for a voiding trial.  We specifically discussed the risks of bleeding, infection, retrograde ejaculation, temporary urgency and urge incontinence, very low risk of long-term incontinence, urethral stricture/bladder neck contracture, pathologic evaluation of prostate tissue and possible detection of prostate cancer or other malignancy, and possible need for additional procedures.   HoLEP today   Redell Burnet, MD 12/30/2024  [facility] [address] [place], [place] [address] [phone]  [phone] ° ° °"

## 2024-12-30 NOTE — Anesthesia Preprocedure Evaluation (Signed)
"                                    Anesthesia Evaluation  Patient identified by MRN, date of birth, ID band Patient awake    Reviewed: Allergy & Precautions, H&P , NPO status , Patient's Chart, lab work & pertinent test results, reviewed documented beta blocker date and time   Airway Mallampati: II  TM Distance: >3 FB Neck ROM: full    Dental  (+) Teeth Intact   Pulmonary sleep apnea and Continuous Positive Airway Pressure Ventilation    Pulmonary exam normal        Cardiovascular Exercise Tolerance: Poor hypertension, On Medications negative cardio ROS Normal cardiovascular exam Rhythm:regular Rate:Normal     Neuro/Psych    Depression    negative neurological ROS  negative psych ROS   GI/Hepatic Neg liver ROS,GERD  Medicated,,  Endo/Other  negative endocrine ROSdiabetes, Well Controlled    Renal/GU negative Renal ROS  negative genitourinary   Musculoskeletal   Abdominal   Peds  Hematology negative hematology ROS (+)   Anesthesia Other Findings Past Medical History: No date: Abnormal colonoscopy     Comment:  polyps noted, benign, [facility], rec 10 year follow up No date: Alcoholism (HCC)     Comment:  11 yrs ago  No date: BPH (benign prostatic hyperplasia) No date: Depression No date: Diabetes mellitus No date: Diverticulosis No date: Gallstones No date: GERD (gastroesophageal reflux disease) No date: H/O hematuria No date: Hemorrhoids No date: Hyperlipidemia No date: Hypertension No date: Iron deficiency No date: Sleep apnea No date: Vitamin D  deficiency Past Surgical History: 02/12/2009: CARDIOVASCULAR STRESS TEST     Comment:  Negative/AT [facility] Dr Lynwood Bare No date: COLONOSCOPY 07/03/2012: CYSTOSCOPY No date: RHINOPLASTY     Comment:  soft palate reduction for apnea No date: Soft palate reduction for Sleep Apnea     Comment:  [facility] No date: TONSILLECTOMY No date: UPPER GASTROINTESTINAL ENDOSCOPY No date: VASECTOMY BMI     Body Mass Index: 25.42 kg/m     Reproductive/Obstetrics negative OB ROS                              Anesthesia Physical Anesthesia Plan  ASA: 3  Anesthesia Plan: General ETT   Post-op Pain Management:    Induction:   PONV Risk Score and Plan: 3  Airway Management Planned:   Additional Equipment:   Intra-op Plan:   Post-operative Plan:   Informed Consent: I have reviewed the patients History and Physical, chart, labs and discussed the procedure including the risks, benefits and alternatives for the proposed anesthesia with the patient or authorized representative who has indicated his/her understanding and acceptance.     Dental Advisory Given  Plan Discussed with: CRNA  Anesthesia Plan Comments:         Anesthesia Quick Evaluation  "

## 2024-12-30 NOTE — Transfer of Care (Signed)
 Immediate Anesthesia Transfer of Care Note  Patient: Bruce Wyatt.  Procedure(s) Performed: ENUCLEATION, PROSTATE, USING LASER, WITH MORCELLATION  Patient Location: PACU  Anesthesia Type:General  Level of Consciousness: awake, drowsy, and patient cooperative  Airway & Oxygen Therapy: Patient Spontanous Breathing and Patient connected to face mask oxygen  Post-op Assessment: Report given to RN and Post -op Vital signs reviewed and stable  Post vital signs: Reviewed and stable  Last Vitals:  Vitals Value Taken Time  BP 143/76 12/30/24 08:56  Temp    Pulse 54 12/30/24 08:59  Resp 15 12/30/24 08:59  SpO2 100 % 12/30/24 08:59  Vitals shown include unfiled device data.  Last Pain:  Vitals:   12/30/24 0626  PainSc: 0-No pain         Complications: No notable events documented.

## 2024-12-30 NOTE — Op Note (Signed)
 Date of procedure: 12/30/2024  Preoperative diagnosis:  BPH with retention  Postoperative diagnosis:  Same  Procedure: HoLEP (Holmium Laser Enucleation of the Prostate)  Surgeon: Redell Burnet, MD  Anesthesia: General  Complications: None  Intraoperative findings:  Moderate size prostate with obstructive lateral lobes Uncomplicated HOLEP, excellent hemostasis, ureteral orifices and verumontanum intact at conclusion of case  EBL: 20ml  Specimens: Prostate chips  Enucleation time: 21 minutes  Morcellation time: 8 minutes  Intra-op weight: 64g  Drains: 24 French three-way, 60 cc in balloon  Indication: Bruce Wyatt. is a 79 y.o. patient with Parkinson's disease, BPH and retention, recurrent UTIs who opted for HOLEP to try to resume spontaneous voiding.  After reviewing the management options for treatment, they elected to proceed with the above surgical procedure(s). We have discussed the potential benefits and risks of the procedure, side effects of the proposed treatment, the likelihood of the patient achieving the goals of the procedure, and any potential problems that might occur during the procedure or recuperation.  We specifically discussed the risks of bleeding, infection, hematuria and clot retention, need for additional procedures, possible overnight hospital stay, temporary urgency and incontinence, rare long-term incontinence, and retrograde ejaculation.  Informed consent has been obtained.   Description of procedure:  The patient was taken to the operating room and general anesthesia was induced.  The patient was placed in the dorsal lithotomy position, prepped and draped in the usual sterile fashion, and preoperative antibiotics were administered.  SCDs were placed for DVT prophylaxis.  A preoperative time-out was performed.   Bruce Wyatt sounds were used to gently dilated the urethra up to 64F. The 58 French continuous flow resectoscope was inserted into the  urethra using the visual obturator  The prostate was moderate in size with obstructive lateral lobes. The bladder was thoroughly inspected and notable for moderate catheter cystitis at the posterior wall but no suspicious lesions.  The ureteral orifices were located in orthotopic position.    The laser was set to 2 J and 60 Hz and early apical release was performed by making a circumferential mucosal incision proximal to the sphincter.  A lambda incision was then made proximal to the verumontanum.  The prostate was enucleated en bloc circumferentially into the bladder.  The capsule was examined and laser was used for meticulous hemostasis.    The 60 French resectoscope was then switched out for the 26 French nephroscope and prostate tissue was morcellated(Piranha) and the tissue sent to pathology.  A 24 French three-way catheter was inserted easily with the aid of a catheter guide, and 60 cc were placed in the balloon.  Urine was initially red, but cleared quickly after irrigation a faint pink.  The catheter irrigated easily with a Toomey syringe.  CBI was initiated.   The patient tolerated the procedure well without any immediate complications and was extubated and transferred to the recovery room in stable condition.  Urine was clear on fast CBI.  Disposition: Stable to PACU  Plan: -Wean CBI in PACU, anticipate discharge home today with Foley removal in clinic in 2-3 days -With his Parkinson's disease, may consider OAB medications in the future but would wait at least 3 months postop to confirm appropriate emptying and reevaluate symptoms  Redell Burnet, MD 12/30/2024

## 2024-12-30 NOTE — Anesthesia Procedure Notes (Signed)
 Procedure Name: Intubation Date/Time: 12/30/2024 7:50 AM  Performed by: Ledora Duncan, CRNAPre-anesthesia Checklist: Patient identified, Emergency Drugs available, Suction available and Patient being monitored Patient Re-evaluated:Patient Re-evaluated prior to induction Oxygen Delivery Method: Circle system utilized Preoxygenation: Pre-oxygenation with 100% oxygen Induction Type: IV induction Ventilation: Mask ventilation without difficulty Laryngoscope Size: McGrath and 4 Grade View: Grade I Tube type: Oral Tube size: 7.0 mm Number of attempts: 1 Airway Equipment and Method: Stylet Placement Confirmation: ETT inserted through vocal cords under direct vision, positive ETCO2 and breath sounds checked- equal and bilateral Secured at: 21 cm Tube secured with: Tape Dental Injury: Teeth and Oropharynx as per pre-operative assessment

## 2024-12-31 ENCOUNTER — Ambulatory Visit: Payer: Self-pay | Admitting: Urology

## 2024-12-31 ENCOUNTER — Encounter: Payer: Self-pay | Admitting: Urology

## 2024-12-31 LAB — SURGICAL PATHOLOGY

## 2024-12-31 NOTE — Progress Notes (Addendum)
 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 Reports catheter draining well at home with clear to light-pink urine. No fever, chills, abdominal pain, or difficulty with catheter function. Mild urethral discomfort noted, expected postoperatively. No other concerns expressed.  Pathology negative.    Objective: Blood pressure (!) 143/63, pulse 79, weight 198 lb (89.8 kg), SpO2 99%.  General: Alert, comfortable, no acute distress. GU: Foley catheter in place draining clear/light-pink urine. No clots noted. Abdomen: Soft, non-tender, non-distended. Perineum/Meatus: No erythema, discharge, or signs of infection.  Assessment: Status post HoLEP, presenting for routine Foley removal. No signs of infection or obstruction. Appropriate candidate for trial of void.  Catheter Removal 60 ml of water was drained from the balloon. A 24 FR 3-way foley cath was removed from the bladder, no complications were noted. Patient tolerated well.  Performed by: CLOTILDA CORNWALL, PA-C   Plan: - Foley catheter removed without difficulty. - Encourage increased oral hydration. - If unable to void or PVR elevated, discuss options including catheter replacement and follow-up plan. - Reviewed expected postoperative symptoms (urgency, frequency, mild dysuria, intermittent hematuria). - Provided return precautions: inability to void, worsening hematuria with clots, fever, severe pain. - Patient notified of negative prostate chip pathology  - Follow up as scheduled or sooner if concerns arise.

## 2025-01-01 ENCOUNTER — Ambulatory Visit: Admitting: Urology

## 2025-01-01 VITALS — BP 143/63 | HR 79 | Wt 198.0 lb

## 2025-01-01 DIAGNOSIS — N138 Other obstructive and reflux uropathy: Secondary | ICD-10-CM

## 2025-01-01 DIAGNOSIS — N401 Enlarged prostate with lower urinary tract symptoms: Secondary | ICD-10-CM

## 2025-01-01 NOTE — Patient Instructions (Signed)

## 2025-01-03 NOTE — Anesthesia Postprocedure Evaluation (Signed)
"   Anesthesia Post Note  Patient: Bruce Wyatt.  Procedure(s) Performed: ENUCLEATION, PROSTATE, USING LASER, WITH MORCELLATION  Patient location during evaluation: PACU Anesthesia Type: General Level of consciousness: awake and alert Pain management: pain level controlled Vital Signs Assessment: post-procedure vital signs reviewed and stable Respiratory status: spontaneous breathing, nonlabored ventilation, respiratory function stable and patient connected to nasal cannula oxygen Cardiovascular status: blood pressure returned to baseline and stable Postop Assessment: no apparent nausea or vomiting Anesthetic complications: no   No notable events documented.   Last Vitals:  Vitals:   12/30/24 0945 12/30/24 1014  BP: 116/68 131/65  Pulse: (!) 49 (!) 52  Resp: 13 15  Temp:  (!) 36.3 C  SpO2: 99% 98%    Last Pain:  Vitals:   12/31/24 0831  TempSrc:   PainSc: 0-No pain                 Lynwood KANDICE Clause      "

## 2025-02-25 ENCOUNTER — Ambulatory Visit: Admitting: Internal Medicine

## 2025-04-16 ENCOUNTER — Ambulatory Visit: Admitting: Urology
